# Patient Record
Sex: Female | Born: 1955 | Race: White | Hispanic: No | Marital: Married
Health system: Midwestern US, Community
[De-identification: ages and names within clinical notes are randomized; demographics above are authoritative.]

## PROBLEM LIST (undated history)

## (undated) DIAGNOSIS — F5101 Primary insomnia: Secondary | ICD-10-CM

## (undated) DIAGNOSIS — Z1231 Encounter for screening mammogram for malignant neoplasm of breast: Secondary | ICD-10-CM

## (undated) DIAGNOSIS — M797 Fibromyalgia: Secondary | ICD-10-CM

## (undated) DIAGNOSIS — J069 Acute upper respiratory infection, unspecified: Secondary | ICD-10-CM

## (undated) DIAGNOSIS — I10 Essential (primary) hypertension: Secondary | ICD-10-CM

## (undated) DIAGNOSIS — M5417 Radiculopathy, lumbosacral region: Secondary | ICD-10-CM

## (undated) DIAGNOSIS — L280 Lichen simplex chronicus: Secondary | ICD-10-CM

## (undated) DIAGNOSIS — M5126 Other intervertebral disc displacement, lumbar region: Secondary | ICD-10-CM

## (undated) DIAGNOSIS — L02224 Furuncle of groin: Secondary | ICD-10-CM

## (undated) DIAGNOSIS — R7989 Other specified abnormal findings of blood chemistry: Secondary | ICD-10-CM

## (undated) DIAGNOSIS — IMO0002 Reserved for concepts with insufficient information to code with codable children: Secondary | ICD-10-CM

## (undated) DIAGNOSIS — E782 Mixed hyperlipidemia: Secondary | ICD-10-CM

## (undated) DIAGNOSIS — IMO0001 Reserved for inherently not codable concepts without codable children: Secondary | ICD-10-CM

## (undated) DIAGNOSIS — E876 Hypokalemia: Secondary | ICD-10-CM

## (undated) DIAGNOSIS — Z9221 Personal history of antineoplastic chemotherapy: Secondary | ICD-10-CM

## (undated) DIAGNOSIS — R51 Headache: Secondary | ICD-10-CM

## (undated) DIAGNOSIS — Z882 Allergy status to sulfonamides status: Secondary | ICD-10-CM

## (undated) DIAGNOSIS — F329 Major depressive disorder, single episode, unspecified: Secondary | ICD-10-CM

## (undated) DIAGNOSIS — F419 Anxiety disorder, unspecified: Secondary | ICD-10-CM

## (undated) DIAGNOSIS — C801 Malignant (primary) neoplasm, unspecified: Secondary | ICD-10-CM

## (undated) DIAGNOSIS — Z923 Personal history of irradiation: Secondary | ICD-10-CM

## (undated) DIAGNOSIS — F32A Depression, unspecified: Secondary | ICD-10-CM

## (undated) DIAGNOSIS — R030 Elevated blood-pressure reading, without diagnosis of hypertension: Secondary | ICD-10-CM

## (undated) HISTORY — DX: Anxiety disorder, unspecified: F41.9

## (undated) HISTORY — DX: Allergy status to sulfonamides: Z88.2

## (undated) HISTORY — PX: PLACEMENT OF BREAST IMPLANTS: SHX6334

## (undated) HISTORY — DX: Headache: R51

## (undated) HISTORY — PX: OTHER SURGICAL HISTORY: SHX169

## (undated) HISTORY — DX: Reserved for inherently not codable concepts without codable children: IMO0001

## (undated) HISTORY — DX: Depression, unspecified: F32.A

## (undated) HISTORY — DX: Malignant (primary) neoplasm, unspecified: C80.1

## (undated) HISTORY — DX: Elevated blood-pressure reading, without diagnosis of hypertension: R03.0

## (undated) HISTORY — DX: Hypokalemia: E87.6

## (undated) HISTORY — PX: AUGMENTATION MAMMAPLASTY: SUR837

## (undated) HISTORY — DX: Major depressive disorder, single episode, unspecified: F32.9

---

## 1997-12-08 ENCOUNTER — Ambulatory Visit (HOSPITAL_COMMUNITY): Admission: RE | Admit: 1997-12-08 | Discharge: 1997-12-08 | Payer: Self-pay | Admitting: *Deleted

## 1998-10-15 ENCOUNTER — Other Ambulatory Visit: Admission: RE | Admit: 1998-10-15 | Discharge: 1998-10-15 | Payer: Self-pay | Admitting: *Deleted

## 1998-12-29 ENCOUNTER — Encounter: Payer: Self-pay | Admitting: *Deleted

## 1998-12-29 ENCOUNTER — Ambulatory Visit (HOSPITAL_COMMUNITY): Admission: RE | Admit: 1998-12-29 | Discharge: 1998-12-29 | Payer: Self-pay | Admitting: *Deleted

## 2000-01-28 ENCOUNTER — Ambulatory Visit (HOSPITAL_COMMUNITY): Admission: RE | Admit: 2000-01-28 | Discharge: 2000-01-28 | Payer: Self-pay | Admitting: *Deleted

## 2000-01-28 ENCOUNTER — Encounter: Payer: Self-pay | Admitting: *Deleted

## 2000-02-23 ENCOUNTER — Other Ambulatory Visit: Admission: RE | Admit: 2000-02-23 | Discharge: 2000-02-23 | Payer: Self-pay | Admitting: *Deleted

## 2000-03-10 ENCOUNTER — Other Ambulatory Visit: Admission: RE | Admit: 2000-03-10 | Discharge: 2000-03-10 | Payer: Self-pay | Admitting: *Deleted

## 2000-03-10 ENCOUNTER — Encounter (INDEPENDENT_AMBULATORY_CARE_PROVIDER_SITE_OTHER): Payer: Self-pay | Admitting: Specialist

## 2001-02-02 ENCOUNTER — Encounter: Payer: Self-pay | Admitting: *Deleted

## 2001-02-02 ENCOUNTER — Ambulatory Visit (HOSPITAL_COMMUNITY): Admission: RE | Admit: 2001-02-02 | Discharge: 2001-02-02 | Payer: Self-pay | Admitting: *Deleted

## 2001-06-19 ENCOUNTER — Other Ambulatory Visit: Admission: RE | Admit: 2001-06-19 | Discharge: 2001-06-19 | Payer: Self-pay | Admitting: *Deleted

## 2002-03-05 ENCOUNTER — Encounter: Payer: Self-pay | Admitting: *Deleted

## 2002-03-05 ENCOUNTER — Ambulatory Visit (HOSPITAL_COMMUNITY): Admission: RE | Admit: 2002-03-05 | Discharge: 2002-03-05 | Payer: Self-pay | Admitting: *Deleted

## 2002-06-11 ENCOUNTER — Other Ambulatory Visit: Admission: RE | Admit: 2002-06-11 | Discharge: 2002-06-11 | Payer: Self-pay | Admitting: *Deleted

## 2002-08-08 HISTORY — PX: BREAST LUMPECTOMY: SHX2

## 2002-11-19 ENCOUNTER — Encounter (INDEPENDENT_AMBULATORY_CARE_PROVIDER_SITE_OTHER): Payer: Self-pay | Admitting: *Deleted

## 2002-11-19 ENCOUNTER — Other Ambulatory Visit: Admission: RE | Admit: 2002-11-19 | Discharge: 2002-11-19 | Payer: Self-pay | Admitting: Radiology

## 2002-11-19 ENCOUNTER — Encounter: Admission: RE | Admit: 2002-11-19 | Discharge: 2002-11-19 | Payer: Self-pay | Admitting: *Deleted

## 2002-11-19 ENCOUNTER — Encounter (INDEPENDENT_AMBULATORY_CARE_PROVIDER_SITE_OTHER): Payer: Self-pay | Admitting: Radiology

## 2002-11-19 ENCOUNTER — Encounter: Payer: Self-pay | Admitting: *Deleted

## 2002-11-25 ENCOUNTER — Encounter: Payer: Self-pay | Admitting: General Surgery

## 2002-11-25 ENCOUNTER — Ambulatory Visit (HOSPITAL_COMMUNITY): Admission: RE | Admit: 2002-11-25 | Discharge: 2002-11-25 | Payer: Self-pay | Admitting: General Surgery

## 2002-11-25 ENCOUNTER — Encounter (INDEPENDENT_AMBULATORY_CARE_PROVIDER_SITE_OTHER): Payer: Self-pay | Admitting: *Deleted

## 2002-12-02 ENCOUNTER — Ambulatory Visit: Admission: RE | Admit: 2002-12-02 | Discharge: 2002-12-17 | Payer: Self-pay | Admitting: *Deleted

## 2002-12-10 ENCOUNTER — Ambulatory Visit: Admission: RE | Admit: 2002-12-10 | Discharge: 2002-12-10 | Payer: Self-pay | Admitting: Oncology

## 2002-12-10 ENCOUNTER — Encounter (INDEPENDENT_AMBULATORY_CARE_PROVIDER_SITE_OTHER): Payer: Self-pay | Admitting: Cardiology

## 2003-01-08 ENCOUNTER — Ambulatory Visit (HOSPITAL_BASED_OUTPATIENT_CLINIC_OR_DEPARTMENT_OTHER): Admission: RE | Admit: 2003-01-08 | Discharge: 2003-01-08 | Payer: Self-pay | Admitting: General Surgery

## 2003-01-08 ENCOUNTER — Encounter: Payer: Self-pay | Admitting: General Surgery

## 2003-04-09 ENCOUNTER — Ambulatory Visit: Admission: RE | Admit: 2003-04-09 | Discharge: 2003-06-13 | Payer: Self-pay | Admitting: *Deleted

## 2003-05-09 ENCOUNTER — Ambulatory Visit (HOSPITAL_COMMUNITY): Admission: RE | Admit: 2003-05-09 | Discharge: 2003-05-09 | Payer: Self-pay | Admitting: General Surgery

## 2003-06-12 ENCOUNTER — Other Ambulatory Visit: Admission: RE | Admit: 2003-06-12 | Discharge: 2003-06-12 | Payer: Self-pay | Admitting: *Deleted

## 2003-07-11 ENCOUNTER — Ambulatory Visit: Admission: RE | Admit: 2003-07-11 | Discharge: 2003-07-11 | Payer: Self-pay | Admitting: *Deleted

## 2003-10-27 ENCOUNTER — Ambulatory Visit (HOSPITAL_COMMUNITY): Admission: RE | Admit: 2003-10-27 | Discharge: 2003-10-27 | Payer: Self-pay | Admitting: Oncology

## 2003-11-21 ENCOUNTER — Encounter: Admission: RE | Admit: 2003-11-21 | Discharge: 2003-11-21 | Payer: Self-pay | Admitting: *Deleted

## 2004-06-15 ENCOUNTER — Other Ambulatory Visit: Admission: RE | Admit: 2004-06-15 | Discharge: 2004-06-15 | Payer: Self-pay | Admitting: *Deleted

## 2004-09-27 ENCOUNTER — Ambulatory Visit: Payer: Self-pay | Admitting: Oncology

## 2004-09-27 ENCOUNTER — Ambulatory Visit (HOSPITAL_COMMUNITY): Admission: RE | Admit: 2004-09-27 | Discharge: 2004-09-27 | Payer: Self-pay | Admitting: Oncology

## 2004-11-22 ENCOUNTER — Encounter: Admission: RE | Admit: 2004-11-22 | Discharge: 2004-11-22 | Payer: Self-pay | Admitting: Oncology

## 2004-12-24 ENCOUNTER — Ambulatory Visit: Payer: Self-pay | Admitting: Oncology

## 2005-04-22 ENCOUNTER — Ambulatory Visit: Payer: Self-pay | Admitting: Oncology

## 2005-06-27 ENCOUNTER — Other Ambulatory Visit: Admission: RE | Admit: 2005-06-27 | Discharge: 2005-06-27 | Payer: Self-pay | Admitting: *Deleted

## 2005-08-10 ENCOUNTER — Ambulatory Visit: Payer: Self-pay | Admitting: Internal Medicine

## 2005-08-16 ENCOUNTER — Ambulatory Visit: Payer: Self-pay | Admitting: Internal Medicine

## 2005-08-19 ENCOUNTER — Ambulatory Visit: Payer: Self-pay | Admitting: Oncology

## 2005-11-23 ENCOUNTER — Encounter: Admission: RE | Admit: 2005-11-23 | Discharge: 2005-11-23 | Payer: Self-pay | Admitting: Oncology

## 2005-12-15 ENCOUNTER — Ambulatory Visit: Payer: Self-pay | Admitting: Oncology

## 2005-12-19 LAB — COMPREHENSIVE METABOLIC PANEL
ALT: 18 U/L (ref 0–40)
Albumin: 4.4 g/dL (ref 3.5–5.2)
CO2: 28 mEq/L (ref 19–32)
Calcium: 8.6 mg/dL (ref 8.4–10.5)
Chloride: 104 mEq/L (ref 96–112)
Glucose, Bld: 88 mg/dL (ref 70–99)
Potassium: 3.3 mEq/L — ABNORMAL LOW (ref 3.5–5.3)
Sodium: 142 mEq/L (ref 135–145)
Total Bilirubin: 0.5 mg/dL (ref 0.3–1.2)
Total Protein: 7.1 g/dL (ref 6.0–8.3)

## 2005-12-19 LAB — CBC WITH DIFFERENTIAL/PLATELET
Eosinophils Absolute: 0 10*3/uL (ref 0.0–0.5)
LYMPH%: 32.7 % (ref 14.0–48.0)
MCV: 91.3 fL (ref 81.0–101.0)
MONO%: 9.3 % (ref 0.0–13.0)
NEUT#: 2.5 10*3/uL (ref 1.5–6.5)
Platelets: 200 10*3/uL (ref 145–400)
RBC: 4.62 10*6/uL (ref 3.70–5.32)

## 2005-12-19 LAB — LACTATE DEHYDROGENASE: LDH: 172 U/L (ref 94–250)

## 2006-01-27 ENCOUNTER — Ambulatory Visit: Payer: Self-pay | Admitting: Oncology

## 2006-01-31 LAB — BASIC METABOLIC PANEL
BUN: 7 mg/dL (ref 6–23)
CO2: 29 mEq/L (ref 19–32)
Chloride: 104 mEq/L (ref 96–112)
Glucose, Bld: 115 mg/dL — ABNORMAL HIGH (ref 70–99)
Potassium: 3.6 mEq/L (ref 3.5–5.3)

## 2006-01-31 LAB — CBC WITH DIFFERENTIAL/PLATELET
Basophils Absolute: 0 10*3/uL (ref 0.0–0.1)
EOS%: 1 % (ref 0.0–7.0)
Eosinophils Absolute: 0.1 10*3/uL (ref 0.0–0.5)
HGB: 14.3 g/dL (ref 11.6–15.9)
MONO#: 0.4 10*3/uL (ref 0.1–0.9)
NEUT#: 3 10*3/uL (ref 1.5–6.5)
RDW: 11.6 % (ref 11.3–14.5)
WBC: 5.2 10*3/uL (ref 3.9–10.0)
lymph#: 1.7 10*3/uL (ref 0.9–3.3)

## 2006-04-14 ENCOUNTER — Ambulatory Visit: Payer: Self-pay | Admitting: Oncology

## 2006-04-17 LAB — CBC WITH DIFFERENTIAL/PLATELET
Basophils Absolute: 0 10*3/uL (ref 0.0–0.1)
EOS%: 1 % (ref 0.0–7.0)
Eosinophils Absolute: 0 10*3/uL (ref 0.0–0.5)
HCT: 42.6 % (ref 34.8–46.6)
HGB: 14.9 g/dL (ref 11.6–15.9)
MCH: 31.9 pg (ref 26.0–34.0)
MCV: 91.1 fL (ref 81.0–101.0)
MONO%: 7.7 % (ref 0.0–13.0)
NEUT#: 3 10*3/uL (ref 1.5–6.5)
NEUT%: 63.5 % (ref 39.6–76.8)
RDW: 11.7 % (ref 11.3–14.5)

## 2006-04-17 LAB — COMPREHENSIVE METABOLIC PANEL
BUN: 9 mg/dL (ref 6–23)
CO2: 28 mEq/L (ref 19–32)
Calcium: 9.3 mg/dL (ref 8.4–10.5)
Chloride: 106 mEq/L (ref 96–112)
Creatinine, Ser: 0.67 mg/dL (ref 0.40–1.20)
Total Bilirubin: 0.4 mg/dL (ref 0.3–1.2)

## 2006-04-17 LAB — LACTATE DEHYDROGENASE: LDH: 151 U/L (ref 94–250)

## 2006-07-20 ENCOUNTER — Other Ambulatory Visit: Admission: RE | Admit: 2006-07-20 | Discharge: 2006-07-20 | Payer: Self-pay | Admitting: *Deleted

## 2006-08-09 ENCOUNTER — Ambulatory Visit: Payer: Self-pay | Admitting: Oncology

## 2006-08-14 ENCOUNTER — Ambulatory Visit (HOSPITAL_COMMUNITY): Admission: RE | Admit: 2006-08-14 | Discharge: 2006-08-14 | Payer: Self-pay | Admitting: Oncology

## 2006-08-14 LAB — COMPREHENSIVE METABOLIC PANEL
ALT: 18 U/L (ref 0–35)
AST: 24 U/L (ref 0–37)
Calcium: 9.2 mg/dL (ref 8.4–10.5)
Chloride: 104 mEq/L (ref 96–112)
Creatinine, Ser: 0.67 mg/dL (ref 0.40–1.20)
Sodium: 141 mEq/L (ref 135–145)
Total Bilirubin: 0.6 mg/dL (ref 0.3–1.2)
Total Protein: 7.1 g/dL (ref 6.0–8.3)

## 2006-08-14 LAB — CBC WITH DIFFERENTIAL/PLATELET
BASO%: 0.2 % (ref 0.0–2.0)
EOS%: 1 % (ref 0.0–7.0)
HCT: 43.5 % (ref 34.8–46.6)
MCH: 31.6 pg (ref 26.0–34.0)
MCHC: 34.8 g/dL (ref 32.0–36.0)
NEUT%: 64.1 % (ref 39.6–76.8)
RBC: 4.8 10*6/uL (ref 3.70–5.32)
WBC: 5.7 10*3/uL (ref 3.9–10.0)
lymph#: 1.5 10*3/uL (ref 0.9–3.3)

## 2006-11-27 ENCOUNTER — Encounter: Admission: RE | Admit: 2006-11-27 | Discharge: 2006-11-27 | Payer: Self-pay | Admitting: Oncology

## 2006-12-07 ENCOUNTER — Ambulatory Visit: Payer: Self-pay | Admitting: Oncology

## 2006-12-11 LAB — CBC WITH DIFFERENTIAL/PLATELET
Basophils Absolute: 0 10*3/uL (ref 0.0–0.1)
EOS%: 0.8 % (ref 0.0–7.0)
HCT: 40.3 % (ref 34.8–46.6)
HGB: 14.4 g/dL (ref 11.6–15.9)
LYMPH%: 26.4 % (ref 14.0–48.0)
MCH: 31.9 pg (ref 26.0–34.0)
MCV: 89.4 fL (ref 81.0–101.0)
MONO%: 6.9 % (ref 0.0–13.0)
NEUT%: 65.5 % (ref 39.6–76.8)

## 2006-12-11 LAB — COMPREHENSIVE METABOLIC PANEL
ALT: 35 U/L (ref 0–35)
AST: 45 U/L — ABNORMAL HIGH (ref 0–37)
Calcium: 8.5 mg/dL (ref 8.4–10.5)
Chloride: 108 mEq/L (ref 96–112)
Creatinine, Ser: 0.62 mg/dL (ref 0.40–1.20)
Sodium: 145 mEq/L (ref 135–145)
Total Protein: 6.7 g/dL (ref 6.0–8.3)

## 2007-01-08 ENCOUNTER — Ambulatory Visit: Payer: Self-pay | Admitting: Gastroenterology

## 2007-01-22 ENCOUNTER — Encounter: Payer: Self-pay | Admitting: Internal Medicine

## 2007-01-22 ENCOUNTER — Ambulatory Visit: Payer: Self-pay | Admitting: Gastroenterology

## 2007-03-01 ENCOUNTER — Ambulatory Visit: Payer: Self-pay | Admitting: Oncology

## 2007-03-14 LAB — ESTRADIOL, ULTRA SENS: Estradiol, Ultra Sensitive: 7 pg/mL

## 2007-03-16 ENCOUNTER — Encounter: Payer: Self-pay | Admitting: Internal Medicine

## 2007-03-26 LAB — COMPREHENSIVE METABOLIC PANEL
Albumin: 4.2 g/dL (ref 3.5–5.2)
Alkaline Phosphatase: 59 U/L (ref 39–117)
BUN: 8 mg/dL (ref 6–23)
CO2: 27 mEq/L (ref 19–32)
Calcium: 9 mg/dL (ref 8.4–10.5)
Chloride: 104 mEq/L (ref 96–112)
Glucose, Bld: 95 mg/dL (ref 70–99)
Potassium: 3.9 mEq/L (ref 3.5–5.3)

## 2007-03-26 LAB — CBC WITH DIFFERENTIAL/PLATELET
Basophils Absolute: 0 10*3/uL (ref 0.0–0.1)
HCT: 41.7 % (ref 34.8–46.6)
HGB: 15.2 g/dL (ref 11.6–15.9)
MONO#: 0.3 10*3/uL (ref 0.1–0.9)
NEUT%: 74.6 % (ref 39.6–76.8)
WBC: 6 10*3/uL (ref 3.9–10.0)
lymph#: 1.2 10*3/uL (ref 0.9–3.3)

## 2007-03-26 LAB — LACTATE DEHYDROGENASE: LDH: 178 U/L (ref 94–250)

## 2007-07-12 ENCOUNTER — Ambulatory Visit: Payer: Self-pay | Admitting: Oncology

## 2007-07-16 ENCOUNTER — Ambulatory Visit (HOSPITAL_COMMUNITY): Admission: RE | Admit: 2007-07-16 | Discharge: 2007-07-16 | Payer: Self-pay | Admitting: Oncology

## 2007-07-16 LAB — COMPREHENSIVE METABOLIC PANEL
ALT: 21 U/L (ref 0–35)
Albumin: 4.2 g/dL (ref 3.5–5.2)
CO2: 27 mEq/L (ref 19–32)
Glucose, Bld: 91 mg/dL (ref 70–99)
Potassium: 3.9 mEq/L (ref 3.5–5.3)
Sodium: 144 mEq/L (ref 135–145)
Total Protein: 6.9 g/dL (ref 6.0–8.3)

## 2007-07-16 LAB — CBC WITH DIFFERENTIAL/PLATELET
BASO%: 0.7 % (ref 0.0–2.0)
Eosinophils Absolute: 0 10*3/uL (ref 0.0–0.5)
MONO#: 0.4 10*3/uL (ref 0.1–0.9)
NEUT#: 3.4 10*3/uL (ref 1.5–6.5)
RBC: 4.62 10*6/uL (ref 3.70–5.32)
RDW: 11.6 % (ref 11.3–14.5)
WBC: 5.3 10*3/uL (ref 3.9–10.0)
lymph#: 1.4 10*3/uL (ref 0.9–3.3)

## 2007-07-16 LAB — LACTATE DEHYDROGENASE: LDH: 186 U/L (ref 94–250)

## 2007-07-23 ENCOUNTER — Other Ambulatory Visit: Admission: RE | Admit: 2007-07-23 | Discharge: 2007-07-23 | Payer: Self-pay | Admitting: *Deleted

## 2007-09-07 ENCOUNTER — Emergency Department (HOSPITAL_COMMUNITY): Admission: EM | Admit: 2007-09-07 | Discharge: 2007-09-07 | Payer: Self-pay | Admitting: Emergency Medicine

## 2007-11-13 ENCOUNTER — Ambulatory Visit: Payer: Self-pay | Admitting: Oncology

## 2007-11-15 LAB — CBC WITH DIFFERENTIAL/PLATELET
BASO%: 0.3 % (ref 0.0–2.0)
Basophils Absolute: 0 10*3/uL (ref 0.0–0.1)
EOS%: 0.6 % (ref 0.0–7.0)
HCT: 41.3 % (ref 34.8–46.6)
LYMPH%: 27.5 % (ref 14.0–48.0)
MCH: 31.5 pg (ref 26.0–34.0)
MCHC: 35.5 g/dL (ref 32.0–36.0)
MCV: 88.7 fL (ref 81.0–101.0)
MONO%: 6.4 % (ref 0.0–13.0)
NEUT%: 65.2 % (ref 39.6–76.8)
Platelets: 224 10*3/uL (ref 145–400)
lymph#: 1.6 10*3/uL (ref 0.9–3.3)

## 2007-11-15 LAB — COMPREHENSIVE METABOLIC PANEL
ALT: 23 U/L (ref 0–35)
AST: 29 U/L (ref 0–37)
Alkaline Phosphatase: 55 U/L (ref 39–117)
BUN: 9 mg/dL (ref 6–23)
Creatinine, Ser: 0.68 mg/dL (ref 0.40–1.20)
Total Bilirubin: 0.5 mg/dL (ref 0.3–1.2)

## 2007-11-28 ENCOUNTER — Encounter: Admission: RE | Admit: 2007-11-28 | Discharge: 2007-11-28 | Payer: Self-pay | Admitting: *Deleted

## 2007-12-10 ENCOUNTER — Encounter: Admission: RE | Admit: 2007-12-10 | Discharge: 2007-12-10 | Payer: Self-pay | Admitting: *Deleted

## 2008-03-13 ENCOUNTER — Ambulatory Visit: Payer: Self-pay | Admitting: Oncology

## 2008-03-17 ENCOUNTER — Encounter: Payer: Self-pay | Admitting: Internal Medicine

## 2008-03-17 LAB — CBC WITH DIFFERENTIAL/PLATELET
Basophils Absolute: 0 10*3/uL (ref 0.0–0.1)
EOS%: 0.7 % (ref 0.0–7.0)
Eosinophils Absolute: 0.1 10*3/uL (ref 0.0–0.5)
HGB: 15.8 g/dL (ref 11.6–15.9)
MONO#: 0.5 10*3/uL (ref 0.1–0.9)
NEUT#: 5 10*3/uL (ref 1.5–6.5)
RDW: 11.8 % (ref 11.3–14.5)
lymph#: 1.9 10*3/uL (ref 0.9–3.3)

## 2008-03-17 LAB — COMPREHENSIVE METABOLIC PANEL
AST: 28 U/L (ref 0–37)
Albumin: 4.6 g/dL (ref 3.5–5.2)
BUN: 9 mg/dL (ref 6–23)
CO2: 26 mEq/L (ref 19–32)
Calcium: 9.4 mg/dL (ref 8.4–10.5)
Chloride: 101 mEq/L (ref 96–112)
Glucose, Bld: 89 mg/dL (ref 70–99)
Potassium: 3.3 mEq/L — ABNORMAL LOW (ref 3.5–5.3)

## 2008-04-03 LAB — BASIC METABOLIC PANEL
CO2: 28 mEq/L (ref 19–32)
Calcium: 9.1 mg/dL (ref 8.4–10.5)
Chloride: 104 mEq/L (ref 96–112)
Glucose, Bld: 90 mg/dL (ref 70–99)
Sodium: 142 mEq/L (ref 135–145)

## 2008-06-13 ENCOUNTER — Ambulatory Visit: Payer: Self-pay | Admitting: Oncology

## 2008-06-17 ENCOUNTER — Ambulatory Visit (HOSPITAL_COMMUNITY): Admission: RE | Admit: 2008-06-17 | Discharge: 2008-06-17 | Payer: Self-pay | Admitting: Oncology

## 2008-06-17 ENCOUNTER — Encounter: Payer: Self-pay | Admitting: Internal Medicine

## 2008-06-17 LAB — COMPREHENSIVE METABOLIC PANEL
ALT: 18 U/L (ref 0–35)
AST: 25 U/L (ref 0–37)
Albumin: 4.4 g/dL (ref 3.5–5.2)
Alkaline Phosphatase: 53 U/L (ref 39–117)
BUN: 8 mg/dL (ref 6–23)
Potassium: 3.3 mEq/L — ABNORMAL LOW (ref 3.5–5.3)
Sodium: 142 mEq/L (ref 135–145)
Total Protein: 7.2 g/dL (ref 6.0–8.3)

## 2008-06-17 LAB — CBC WITH DIFFERENTIAL/PLATELET
BASO%: 0.2 % (ref 0.0–2.0)
EOS%: 0.8 % (ref 0.0–7.0)
MCH: 32 pg (ref 26.0–34.0)
MCHC: 35.6 g/dL (ref 32.0–36.0)
MCV: 89.8 fL (ref 81.0–101.0)
MONO%: 5.9 % (ref 0.0–13.0)
RBC: 4.91 10*6/uL (ref 3.70–5.32)
RDW: 11.7 % (ref 11.3–14.5)
lymph#: 1.6 10*3/uL (ref 0.9–3.3)

## 2008-06-26 LAB — ESTRADIOL, ULTRA SENS

## 2008-07-25 ENCOUNTER — Ambulatory Visit: Payer: Self-pay | Admitting: Internal Medicine

## 2008-07-26 LAB — CONVERTED CEMR LAB
Hgb A1c MFr Bld: 4.8 % (ref 4.6–6.0)
Potassium: 3.5 meq/L (ref 3.5–5.1)

## 2008-07-30 ENCOUNTER — Telehealth: Payer: Self-pay | Admitting: Internal Medicine

## 2008-07-30 ENCOUNTER — Encounter (INDEPENDENT_AMBULATORY_CARE_PROVIDER_SITE_OTHER): Payer: Self-pay | Admitting: *Deleted

## 2008-08-07 ENCOUNTER — Ambulatory Visit: Payer: Self-pay | Admitting: Internal Medicine

## 2008-08-07 DIAGNOSIS — Z853 Personal history of malignant neoplasm of breast: Secondary | ICD-10-CM | POA: Insufficient documentation

## 2008-08-07 DIAGNOSIS — R030 Elevated blood-pressure reading, without diagnosis of hypertension: Secondary | ICD-10-CM | POA: Insufficient documentation

## 2008-08-07 DIAGNOSIS — E876 Hypokalemia: Secondary | ICD-10-CM | POA: Insufficient documentation

## 2008-08-18 ENCOUNTER — Other Ambulatory Visit: Admission: RE | Admit: 2008-08-18 | Discharge: 2008-08-18 | Payer: Self-pay | Admitting: Gynecology

## 2008-08-26 ENCOUNTER — Ambulatory Visit: Payer: Self-pay | Admitting: Oncology

## 2008-08-28 LAB — BASIC METABOLIC PANEL
BUN: 8 mg/dL (ref 6–23)
Calcium: 9.6 mg/dL (ref 8.4–10.5)
Glucose, Bld: 87 mg/dL (ref 70–99)

## 2008-09-06 LAB — ESTRADIOL, ULTRA SENS

## 2008-09-19 NOTE — Telephone Encounter (Signed)
I called this into [facility] at [facility] to [place].

## 2008-10-23 NOTE — Progress Notes (Signed)
Subjective:      Patient ID: Cassandra Copeland is a 52 y.o. female.    HPI see cc's    Review of Systems   Constitutional: Negative for fever and unexpected weight change.   HENT: Positive for ear pain and congestion. Negative for sore throat and rhinorrhea.    Respiratory: Negative.    Cardiovascular: Negative.    Musculoskeletal: Positive for arthralgias (L ankle pain).       Objective:   Physical Exam   Vitals reviewed.  Constitutional: She appears well-developed and well-nourished.   HENT:   Head: Atraumatic.   Eyes: Conjunctivae are normal. No scleral icterus.   Neck: Neck supple. No thyromegaly present.   Cardiovascular: Normal rate, regular rhythm, normal heart sounds and intact distal pulses.    Pulmonary/Chest: Effort normal and breath sounds normal. No respiratory distress. She has no rales.   Abdominal: Soft. Bowel sounds are normal. She exhibits no mass.   Musculoskeletal: Normal range of motion.        Left ankle: Normal.   Lymphadenopathy:     She has no cervical adenopathy.     She has no axillary adenopathy.        Right: No supraclavicular adenopathy present.        Left: No supraclavicular adenopathy present.   Skin: Skin is warm.   Psychiatric: She has a normal mood and affect.   E  Assessment:      See visit diagnoses/diagnosis  Elev BP      Plan:      see orders  Low na diet, ov 12m

## 2008-10-31 NOTE — Telephone Encounter (Signed)
I notified patient that Ins will not cover Prevacid and she would need to contact her Ins to see what they cover.  She will call us back and let us know what is covered so we can call in that med

## 2008-11-28 ENCOUNTER — Encounter: Admission: RE | Admit: 2008-11-28 | Discharge: 2008-11-28 | Payer: Self-pay | Admitting: Gynecology

## 2008-12-11 ENCOUNTER — Ambulatory Visit: Payer: Self-pay | Admitting: Oncology

## 2008-12-15 ENCOUNTER — Encounter: Payer: Self-pay | Admitting: Internal Medicine

## 2008-12-15 LAB — COMPREHENSIVE METABOLIC PANEL
ALT: 24 U/L (ref 0–35)
AST: 26 U/L (ref 0–37)
Creatinine, Ser: 0.65 mg/dL (ref 0.40–1.20)
Total Bilirubin: 0.4 mg/dL (ref 0.3–1.2)

## 2008-12-15 LAB — CBC WITH DIFFERENTIAL/PLATELET
BASO%: 0.2 % (ref 0.0–2.0)
EOS%: 0.8 % (ref 0.0–7.0)
HCT: 41.8 % (ref 34.8–46.6)
LYMPH%: 29.6 % (ref 14.0–49.7)
MCH: 31.3 pg (ref 25.1–34.0)
MCHC: 36.1 g/dL — ABNORMAL HIGH (ref 31.5–36.0)
MONO%: 6 % (ref 0.0–14.0)
NEUT%: 63.4 % (ref 38.4–76.8)
Platelets: 180 10*3/uL (ref 145–400)

## 2008-12-15 LAB — LACTATE DEHYDROGENASE: LDH: 177 U/L (ref 94–250)

## 2009-02-25 NOTE — Progress Notes (Signed)
Subjective:      Patient ID: Cassandra Copeland is a 53 y.o. female.    HPI  Chief Complaint   Patient presents with   ??? Pain     all over x 2 days; feels like someone hit her with a baseball bat; ran out of celexa 1week ago   ??? Otalgia     bilateral   Patient started feeling bad about 1 week ago.  Non specific. Got better, then yesterday felt dizzy, ears popping and aching all over        Review of Systems   Constitutional: Positive for fever. Negative for diaphoresis.   HENT: Positive for congestion and sinus pressure. Negative for hearing loss, ear pain, sore throat, neck pain, voice change and ear discharge.    Eyes: Negative for visual disturbance.   Respiratory: Negative for cough and shortness of breath.    Cardiovascular: Negative for chest pain.   Gastrointestinal: Positive for diarrhea (for 3 days). Negative for vomiting, blood in stool and rectal pain.   Genitourinary: Negative.    Musculoskeletal: Positive for myalgias and arthralgias.       Objective:   Physical Exam   Vitals reviewed.  Constitutional: She is oriented to person, place, and time. She appears well-developed. No distress.   HENT:   Head: Normocephalic.   Right Ear: External ear normal. No tenderness. Tympanic membrane is not injected. A middle ear effusion is present.   Left Ear: External ear normal. No tenderness. Tympanic membrane is not injected. A middle ear effusion is present.   Mouth/Throat: Oropharynx is clear and moist.   Cardiovascular: Normal rate, regular rhythm and normal heart sounds.    Pulmonary/Chest: Effort normal and breath sounds normal. She has no wheezes. She has no rales.   Abdominal: Soft. She exhibits no distension and no mass. She has no guarding.   Musculoskeletal: She exhibits no edema.   Neurological: She is alert and oriented to person, place, and time.   Skin: No rash noted. She is not diaphoretic. No erythema. No pallor.       Assessment:      1. Viral Syndrome (079.99B)  guaifenesin (MUCINEX) 600 MG SR tablet,  CBC WITH AUTO DIFFERENTIAL   2. Fibromyalgia (729.1AV)  citalopram (CELEXA) 40 MG tablet             Plan:

## 2009-02-26 LAB — CBC WITH AUTO DIFFERENTIAL
Basophils %: 0.4 % (ref 0.0–2.0)
Basophils Absolute: 0 10*3 (ref 0.0–0.2)
Eosinophils %: 0.4 % (ref 0.0–5.0)
Eosinophils Absolute: 0 10*3 (ref 0.0–0.6)
Granulocyte Absolute Count: 9.9 10*3 — ABNORMAL HIGH (ref 1.7–7.7)
Hematocrit: 45.6 % (ref 36.0–48.0)
Hemoglobin: 15.5 g/dL (ref 12.0–16.0)
Lymphocytes %: 14.4 % — ABNORMAL LOW (ref 25.0–40.0)
Lymphocytes Absolute: 1.8 10*3 (ref 1.0–5.1)
MCH: 30.1 pg (ref 26–34)
MCHC: 34 g/dL (ref 31–36)
MCV: 88.5 fl (ref 80–100)
MPV: 7.7 fl (ref 5.0–10.5)
Monocytes %: 3.7 % (ref 0.0–8.0)
Monocytes Absolute: 0.5 10*3 (ref 0.0–0.95)
Platelets: 217 10*3 (ref 135–450)
RBC: 5.15 10*6 (ref 4.0–5.2)
RDW: 13.4 % (ref 11.5–14.5)
Segs Relative: 81.1 % — ABNORMAL HIGH (ref 42.0–63.0)
WBC: 12.2 10*3 — ABNORMAL HIGH (ref 4.0–11.0)

## 2009-02-26 NOTE — Progress Notes (Addendum)
Patient notified

## 2009-02-27 NOTE — Telephone Encounter (Signed)
Patient notified

## 2009-02-27 NOTE — Telephone Encounter (Signed)
Patient is feeling worse now... She has a temp of 102.7 this am... Last night it was about 101. She feels like she's been beaten up. She just wants to know if you can call in an antibiotic.

## 2009-02-27 NOTE — Telephone Encounter (Signed)
levaquin 500mg m daily for 7 days; ov on 7/26 if no better.

## 2009-03-02 LAB — BASIC METABOLIC PANEL
Anion Gap: 9.6
BUN: 11 mg/dL (ref 7–18)
CO2: 25 meq/L (ref 21–32)
Calcium: 8.8 mg/dL (ref 8.3–10.6)
Chloride: 102 meq/L (ref 99–110)
Creatinine: 0.6 mg/dL (ref 0.6–1.1)
GFR Est, African/Amer: >60
GFR, Estimated: >60 (ref >60)
Glucose: 125 mg/dL — ABNORMAL HIGH (ref 70–99)
Potassium: 3.3 meq/L — ABNORMAL LOW (ref 3.5–5.1)
Sodium: 137 meq/L (ref 136–145)

## 2009-03-02 LAB — URINALYSIS
Bilirubin, Urine: NEGATIVE
Glucose, UA: NEGATIVE
Ketones, Urine: 15 — ABNORMAL HIGH
Leukocyte Esterase, Urine: NEGATIVE
Nitrite, Urine: NEGATIVE
Protein, UA: 30
Specific Gravity, UA: 1.02 (ref 1.003–1.030)
Urobilinogen, Urine: 0.2 EU/dl (ref <2.0)
pH, UA: 6 (ref 4.5–8.0)

## 2009-03-02 LAB — CBC WITH AUTO DIFFERENTIAL
Basophils %: 0.4 % (ref 0.0–2.0)
Basophils Absolute: 0 10*3 (ref 0.0–0.2)
Eosinophils %: 0.6 % (ref 0.0–5.0)
Eosinophils Absolute: 0.1 10*3 (ref 0.0–0.6)
Granulocyte Absolute Count: 6.4 10*3 (ref 1.7–7.7)
Hematocrit: 37.7 % (ref 36.0–48.0)
Hemoglobin: 12.7 g/dL (ref 12.0–16.0)
Lymphocytes %: 20.4 % — ABNORMAL LOW (ref 25.0–40.0)
Lymphocytes Absolute: 1.8 10*3 (ref 1.0–5.1)
MCH: 29.9 pg (ref 26–34)
MCHC: 33.8 g/dL (ref 31–36)
MCV: 88.6 fl (ref 80–100)
MPV: 7.1 fl (ref 5.0–10.5)
Monocytes %: 5.1 % (ref 0.0–8.0)
Monocytes Absolute: 0.4 10*3 (ref 0.0–0.95)
Platelets: 276 10*3 (ref 135–450)
RBC: 4.26 10*6 (ref 4.0–5.2)
RDW: 12.8 % (ref 11.5–14.5)
Segs Relative: 73.5 % — ABNORMAL HIGH (ref 42.0–63.0)
WBC: 8.6 10*3 (ref 4.0–11.0)

## 2009-03-02 NOTE — Progress Notes (Signed)
Patient admitted.  My family physician note in the hospital record will serve as the encounter note.

## 2009-03-02 NOTE — H&P (Unsigned)
PATIENT NAME                  PA #             MR #                  Cassandra Copeland, Cassandra Copeland                 [id number]       [id number]            ATTENDING PHYSICIAN                   ADM DATE    DIS DATE           Everardo Beals, MD               03/02/2009                     DATE OF BIRTH    AGE            PATIENT TYPE      RM #               [birth year]       53             [facility]               0217                  CHIEF COMPLAINT:  Fevers, loose stools, sore areas on left buttock and right  flank.      HISTORY OF PRESENT ILLNESS:  The patient is a 53 year old white female who  was recently seen in my office on 02/25/2009 with nonspecific symptoms of  feeling badly, feeling beat up, slight temperature, lightheadedness and loose  stools for 3 days.  At that time she had no other symptoms.  A CBC was done.   She had a mild leukocytosis but really not much, and we just thought that we  would observe.  However, 2 days later she complained again of feeling this  way with fevers.  She was placed empirically on Levaquin, thinking that maybe  it was a bronchitis or something going on since she had a little bit of a  cough.  She presents today stating that over the weekend she developed a lot  of pain in her left buttock, but she had been having this pain even when she  came to my office but had not thought to mention it.  She had some bumps as  well.  The patient is here for evaluation.       REVIEW OF SYSTEMS:  GENERAL:  Positive fever, negative sweats.  PSYCHIATRIC:   Denies.  NEUROLOGIC:  Denies.  EYES, EARS, NOSE AND THROAT:  No complaints.   HEME AND ENDO:  No complaints.  CARDIOVASCULAR AND RESPIRATORY:  No  complaints:  GI AND GU:  No complaints except for loose stools which have now  gotten better.  SKIN:  Complaining of painful and draining areas on the left  buttock and vulvar region as well as on the right flank and some small spots  in her pubic region on the skin.      PAST MEDICAL HISTORY:    1.   Hospitalizations:  Childbirths, tonsillectomy, bladder surgeries, carpal  tunnel surgeries.   2.  Operations:  See #1.   3.  Other Medical Illnesses:  Really  nothing.   4.  Medications at the Time of Admission:  Levaquin 500 daily   5.  ALLERGIES:  PENICILLIN AND CECLOR.  6.  Habits:  Negative smoking and negative alcohol.   7.  Social History:  Noncontributory.   8.  Family History:  Unremarkable.       PHYSICAL EXAMINATION ON THE DAY OF ADMISSION:  VITAL SIGNS:  Temperature 98.2, pulse 96 and regular, blood pressure 122/76.   GENERAL:  This is a very pleasant, alert white female in no acute distress.    SKIN:  The remarkable feature is multiple follicular lesions.  There is one  on the right flank.  Most of them, however, are in the pubic region.  The  left labium is swollen as is the left buttock region.  There are some  spontaneous open drainage sites, and there is a generalized overall feeling  of extreme fluctuance and induration.    RECTAL EXAM:  Not done.   PELVIC EXAM:  Not done.   EXTREMITIES:  Unremarkable.   HEAD, EYES, EARS, NOSE AND THROAT:  Negative.   NECK:  Negative.   LYMPH NODE:  Negative.   CHEST:  Clear to auscultation and percussion.   CARDIAC:  Regular rhythm.  No murmurs.  Strong pulses.   ABDOMEN:  Soft without hepatosplenomegaly.       ASSESSMENT:  Abscess, buttock and labium with probable staphylococcus skin  infection, diffuse.  Perhaps the inciting event was some shaving of some  areas which was about 3 days before she developed the beginning of these  lesions.      PLAN:  The patient will need evaluation and drainage of the area.  I have  spoken with Dr. Mariane Duval who will see the patient.  The patient will be placed  on vancomycin for the possibility of methicillin-resistant Staphylococcus  aureus, and we will see how she does.                                  [address] [place], MD     JS /[phone]  DD: 03/02/2009 13:49  DT: 03/02/2009 15:12  Job #: [id number]  CC: Lauris Chroman, MD

## 2009-03-02 NOTE — Consults (Unsigned)
PATIENT NAME                  PA #             MR #                  Cassandra Copeland, Cassandra Copeland                 [id number]       [id number]            ATTENDING PHYSICIAN                      CONSULTATION DATE            Everardo Beals, MD                  03/02/2009                   CONSULTING PHYSICIAN                     DISCHARGE DATE               Malloree Raboin Cleatis Polka, MD                                               DATE OF BIRTH    AGE            PATIENT TYPE      RM #               1955/12/20       53             [facility]               0217                  CONSULTING SERVICE:  General Surgery     CHIEF COMPLAINT:  Left buttock abscess.     HISTORY OF PRESENT ILLNESS:  This patient is a 53 year old female seen at the  request of Dr. Salome Spotted.  He called me earlier today requesting  consultation for this patient he was seeing in the office.  She began with  some pain in the left buttock and perineal area last week.  She was given  Levaquin.  She had progression of this.  She then had some spontaneous  drainage through a very small pinhole.  She had resultant cellulitis and Dr.  Salome Spotted made her a direct admit today.  He thought there was a soft  tissue that needed to be drained and requested consultation.  Patient states  she has not been feeling well throughout all this over the last week.     PAST MEDICAL HISTORY:  Gastroesophageal reflux disease.     PAST SURGICAL HISTORY:   She has had shoulder surgery, bladder suspension,  cholecystectomy.     ALLERGIES:  CEPHALOSPORINS and  PENICILLINS.     MEDICATIONS:  At home, include:  1.  Ambien.  2.  Celexa.     SOCIAL HISTORY:  She does not drink alcohol.  She does not smoke cigarettes.     FAMILY HISTORY:  Noncontributory.     REVIEW OF SYSTEMS:  Negative other than stated above.     PHYSICAL EXAMINATION:  VITAL SIGNS:  She is  afebrile with stable vital signs.  GENERAL:  She is comfortable, in no distress.  MUSCULOSKELETAL:  Left buttock near the perineum has an area  of significant  erythema.  There is fluctuance within the middle of this and a pinhole that  is draining some bloody fluid.  All of this is consistent with soft tissue  abscess.  This does not extend up to the labia proper.     ASSESSMENT:  Left buttock abscess.     PLAN:  Will be to do an incision and drainage and obtain cultures.   Currently, she is getting vancomycin.  I will add Tygacil given her allergy  history to broaden her coverage until we get the cultures back.  This was all  explained to the patient.  She accepted the plan.                                Nachum Derossett Cleatis Polka, MD     MP [phone]  DD: 03/02/2009 19:35  DT: 03/02/2009 21:02  Job #: [id number]  CC: Lauris Chroman, MD  CC: Jakaiya Netherland Cleatis Polka, MD

## 2009-03-03 LAB — CBC WITH AUTO DIFFERENTIAL
Basophils %: 0.3 % (ref 0.0–2.0)
Basophils Absolute: 0 10*3 (ref 0.0–0.2)
Eosinophils %: 1.8 % (ref 0.0–5.0)
Eosinophils Absolute: 0.1 10*3 (ref 0.0–0.6)
Granulocyte Absolute Count: 5.3 10*3 (ref 1.7–7.7)
Hematocrit: 37.5 % (ref 36.0–48.0)
Hemoglobin: 13.3 g/dL (ref 12.0–16.0)
Lymphocytes %: 29.7 % (ref 25.0–40.0)
Lymphocytes Absolute: 2.5 10*3 (ref 1.0–5.1)
MCH: 30.3 pg (ref 26–34)
MCHC: 35.3 g/dL (ref 31–36)
MCV: 85.7 fl (ref 80–100)
MPV: 8.4 fl (ref 5.0–10.5)
Monocytes %: 5.8 % (ref 0.0–8.0)
Monocytes Absolute: 0.5 10*3 (ref 0.0–0.95)
Platelets: 307 10*3 (ref 135–450)
RBC: 4.38 10*6 (ref 4.0–5.2)
RDW: 12.8 % (ref 11.5–14.5)
Segs Relative: 62.4 % (ref 42.0–63.0)
WBC: 8.5 10*3 (ref 4.0–11.0)

## 2009-03-03 LAB — BASIC METABOLIC PANEL
Anion Gap: 9
BUN: 14 mg/dL (ref 7–18)
CO2: 27 meq/L (ref 21–32)
Calcium: 9.1 mg/dL (ref 8.3–10.6)
Chloride: 101 meq/L (ref 99–110)
Creatinine: 0.6 mg/dL (ref 0.6–1.1)
GFR Est, African/Amer: >60
GFR, Estimated: >60 (ref >60)
Glucose: 95 mg/dL (ref 70–99)
Potassium: 3.9 meq/L (ref 3.5–5.1)
Sodium: 137 meq/L (ref 136–145)

## 2009-03-03 NOTE — Op Note (Unsigned)
PATIENT NAME                  PA #             MR #                  AILEENE, LANUM                 [id number]       [id number]            SURGEON                               SURG DATE   DIS DATE           Murvin Donning, MD               03/02/2009                     DATE OF BIRTH    AGE            PATIENT TYPE      RM #               20-Mar-1956       53                                                     PROCEDURE PERFORMED:  Incision and drainage of left buttock abscess.     PREPROCEDURE DIAGNOSIS:  Left buttock abscess.     POST PROCEDURE DIAGNOSIS:  Left buttock abscess.     SURGEON:  Consuelo Pandy, MD.     ANESTHESIA:  Local.     COMPLICATIONS:  None.     INDICATIONS FOR THE OPERATION:  The patient was evaluated and had a very red  inflamed, fluctuant area in the left buttock near the perineum.  This was  thought to be a soft tissue abscess.  It was recommended that she have  incision and drainage.  She accepted this.     DESCRIPTION OF OPERATION:  The patient remained in her hospital bed.  Alcohol  prep, lidocaine anesthetic was used.  An abscess of the skin was then  excised.  Copious amounts of pus were evacuated.  This tracked toward the  perineum and toward the labia.  The labia itself was not involved though.   Loculations were broken up digitally.  The wound was packed with gauze and a  dressing placed.     DISPOSITION:  The patient tolerated the procedure well without any acute  complications.                             Karren Newland Cleatis Polka, MD     MP [phone]  DD: 03/03/2009 00:01  DT: 03/03/2009 05:44  Job #: [id number]  CC: Dejean Tribby Cleatis Polka, MD  CC: Lauris Chroman, MD

## 2009-03-03 NOTE — H&P (Unsigned)
PATIENT NAME                  PA #             MR #                  JUANTIA, HOLFORD                 [id number]       [id number]            ATTENDING PHYSICIAN                   ADM DATE    DIS DATE           Glade Nurse, MD               03/02/2009                     DATE OF BIRTH    AGE            PATIENT TYPE      RM #               12/10/1955       53             [facility]               0217                  CHIEF COMPLAINT:  Fever and abscess on buttock.       HISTORY OF PRESENT ILLNESS:  This is a 53 year old white female with an  approximately 5 day history of tenderness and swelling in the lower left  buttock area.  The patient presented to her family doctor with the complaints  of aches and fever, however it took some days for her to describe the  aforementioned swollen tender area. The patient also notes to me today that  she developed red, tender swollen area on her right lower quadrant abdominal  area approximately 3 days ago and 2 days ago a small erythematous tender  nodule on the right lower leg.  The patient notes malaise, fever and body  aches have all but disappeared. The patient had incision and drainage of the  buttock lesion yesterday and that remains quite tender.  She is annoyed at  getting multiple IVs however I recommend we await culture and sensitivity  before placing any other type of IV and I discussed this with the patient.      PAST MEDICAL HISTORY:   1.  Anxiety.  2.  Insomnia.   3.  GERD.  4.  Osteoporosis.     PAST SURGICAL HISTORY:   1.  Tonsillectomy.  2.  Bladder tuck.  3.  Carpal tunnel surgery.   4.  Cholecystectomy.   5.  Shoulder surgery.      MEDICATIONS ON ADMISSION:  1.  Ambien.  2.  Celexa.      ALLERGIES: CEPHALOSPORIN AND PENICILLIN.     FAMILY HISTORY:  None significant.  SOCIAL HISTORY:  The patient is not a smoker.  Denies alcohol intake or other  illicit drug use.  She is independent and married with 2 adult children.      REVIEW OF SYSTEMS:  Positive mild headache.   No visual change, dizziness,  syncope, sore throat, cough, fever, chills or shortness of breath. Positive  nausea.  No vomiting.  No diarrhea,  constipation or dysuria. Positive right  anterior lower abdominal tenderness due to erythematous lesion. No joint pain  or swelling. No rash or numbness. No trauma.  PHYSICAL EXAMINATION:   VITALS:  Temperature 100.1, heart rate 68, respiratory rate 16, saturation  96% on room air, blood pressure 129/84.   HEENT: Sclerae anicteric. Conjunctivae pink.  Pupils equal, round and  reactive to light. Extraocular muscles intact. Mucous membranes moist without  lesion.   NECK:  No lymphadenopathy, JVD or bruit. No thyromegaly.  HEART: Regular rate and rhythm, S1 and S2.  No murmur, rub or gallop.   PULMONARY: Clear to auscultation bilaterally without rales, wheeze, rhonchi  or dullness. Good air movement.   ABDOMEN:  Nontender, nondistended.  Normal active bowel sounds.  Positive  right lower quadrant erythematous nodule with firm subcutaneous component and  warmth. No other masses.  No organomegaly or ascites.    BUTTOCKS:  Lower buttock and perineal area with bloody drainage.  Dressing  intact.  Erythema extending to the labia on the right side with erythema and  firmness still present. The area is tender.   EXTREMITIES: No clubbing, cyanosis or edema. Peripheral pulses +2.      ASSESSMENT AND PLAN:  1.  Buttock and perineal abscess post drainage day #1.  Continue on Tygacil  and vancomycin. Culture and sensitivity are pending.    2.  Bacteremia based on patients history of fever and malaise, clinically  resolving.    3.  Cellulitis and abscess of right lower quadrant.  There is no fluctuance  and no need for drainage.  Continue to monitor.   4.  Reflux, stable.  5.  Depression, stable.                                 Willene Hatchet, MD     Horton Marshall [id number]  DD: 03/03/2009 09:13  DT: 03/03/2009 11:00  Job #: [id number]  CC: Verdene Rio, MD  CC: Willene Hatchet, MD

## 2009-03-03 NOTE — H&P (Unsigned)
PATIENT NAME                  PA #             MR #                  Cassandra Copeland, Cassandra Copeland                 [id number]       [id number]            [place]           Glade Nurse, MD               03/02/2009                     DATE OF BIRTH    AGE            PATIENT TYPE      RM #               [birth year]       53             [facility]               0217                  ADDENDUM     LABORATORY DATA:  CBC:  White count is 8.5.  Hematocrit is 37.5.  Platelets  are 307.  Urinalysis is negative.  Renal Panel:  Potassium is 3.3 and glucose  is 125, otherwise normal.  On 7/21, CBC showed white blood cell count of  12.2.                                  Willene Hatchet, MD     Horton Marshall [id number]  DD: 03/03/2009 09:15  DT: 03/03/2009 10:24  Job #: [id number]  CC: Verdene Rio, MD  CC: Willene Hatchet, MD

## 2009-03-04 LAB — CBC WITH AUTO DIFFERENTIAL
Basophils %: 0.6 % (ref 0.0–2.0)
Basophils Absolute: 0 10*3 (ref 0.0–0.2)
Eosinophils %: 3.5 % (ref 0.0–5.0)
Eosinophils Absolute: 0.2 10*3 (ref 0.0–0.6)
Granulocyte Absolute Count: 3.5 10*3 (ref 1.7–7.7)
Hematocrit: 40.3 % (ref 36.0–48.0)
Hemoglobin: 14.3 g/dL (ref 12.0–16.0)
Lymphocytes %: 38.6 % (ref 25.0–40.0)
Lymphocytes Absolute: 2.6 10*3 (ref 1.0–5.1)
MCH: 30.5 pg (ref 26–34)
MCHC: 35.5 g/dL (ref 31–36)
MCV: 85.8 fl (ref 80–100)
MPV: 7.6 fl (ref 5.0–10.5)
Monocytes %: 5.6 % (ref 0.0–8.0)
Monocytes Absolute: 0.4 10*3 (ref 0.0–0.95)
Platelets: 329 10*3 (ref 135–450)
RBC: 4.69 10*6 (ref 4.0–5.2)
RDW: 12.9 % (ref 11.5–14.5)
Segs Relative: 51.7 % (ref 42.0–63.0)
WBC: 6.9 10*3 (ref 4.0–11.0)

## 2009-03-04 LAB — BASIC METABOLIC PANEL
Anion Gap: 4.3
BUN: 12 mg/dL (ref 7–18)
CO2: 33 meq/L — ABNORMAL HIGH (ref 21–32)
Calcium: 9.1 mg/dL (ref 8.3–10.6)
Chloride: 103 meq/L (ref 99–110)
Creatinine: 0.7 mg/dL (ref 0.6–1.1)
GFR Est, African/Amer: >60
GFR, Estimated: >60 (ref >60)
Glucose: 98 mg/dL (ref 70–99)
Potassium: 3.7 meq/L (ref 3.5–5.1)
Sodium: 140 meq/L (ref 136–145)

## 2009-04-14 NOTE — Telephone Encounter (Signed)
appt made

## 2009-04-14 NOTE — Telephone Encounter (Signed)
Due ov 9-05/2009

## 2009-04-17 NOTE — Discharge Summary (Unsigned)
PATIENT NAME                  PA #             MR #                  DYLEN, BOVEN                 [id number]       [id number]            ATTENDING PHYSICIAN                   ADM DATE    DIS DATE           Glade Nurse, MD               03/02/2009  03/04/2009         DATE OF BIRTH    AGE            PATIENT TYPE                          [birth year]       53             [facility]                                      DISCHARGE DIAGNOSES:   1. MRSA abscess.  2. Surgical drainage of abscess.  3. Right lower quadrant abscess of skin.  4. Hypokalemia.  5. Hyperglycemia, reactive.     For full history and physical, please see admission note.       HOSPITAL COURSE:  The patient was admitted with perineal abscess that  required surgical drainage.  It was culture proven to be MRSA with  sensitivity to both clindamycin and Bactrim.  Hyperglycemia was monitored   Hypokalemia was repleted.  Her perineal abscess improved rapidly after  drainage and with antibiotics, and right lower quadrant skin lesion on  abdomen decreased as well without requiring drainage or developing  significant purulence.  The patient tolerated treatment well and was  discharged home in improved and good condition.      PHYSICAL EXAMINATION:  The day of discharge see progress notes.     LABORATORY DATA:  Renal panel: Sodium 140, potassium 3.7, chloride 103, CO2  33, glucose 98, BUN 12, creatinine 0.7.  CBC white count 6.9, hematocrit  40.3, platelets 329.  MRSA culture positive from left buttock culture.   Urinalysis  negative.  Sensitivity of MRSA to clindamycin and Bactrim,   Tetracycline and vancomycin.       DISCHARGE MEDICATIONS:    1. Oxycodone 10 mg orally every 4 hours as needed for pain.   2. Clindamycin 300 mg orally x3 daily.     DISPOSITION:  She is discharged in improved and good condition to follow up  with Dr. Tania Ade within 3 days.                                       Cassandra Hatchet, MD     Horton Marshall [id number]  DD: 04/17/2009 10:27  DT:  04/19/2009 13:32  Job #: [id number]  CC: Verdene Rio, MD  CC: Cassandra Hatchet, MD

## 2009-04-24 NOTE — Progress Notes (Signed)
Subjective:      Patient ID: Cassandra Copeland is a 53 y.o. female.    HPI  Chief Complaint   Patient presents with   ??? Gastrophageal Reflux-controlled without progressive sx;    ??? Other     fibromyalgia; occ knee pain; worst area is R hip; analgesic helpful   ??? Other     recurrent tinea cruris-R breast-not currently active           Review of Systems   Constitutional: Negative for fatigue and unexpected weight change.   Gastrointestinal: Negative.    Musculoskeletal: Positive for myalgias.   Skin: Positive for rash.        See cc   Psychiatric/Behavioral: Negative.        Objective:   Physical Exam   Vitals reviewed.  Constitutional: She appears well-developed and well-nourished.   HENT:   Head: Normocephalic.   Eyes: Conjunctivae are normal.   Neck: Neck supple. Carotid bruit is not present. No thyromegaly present.   Cardiovascular: Normal rate, regular rhythm and normal heart sounds.    Pulmonary/Chest: Effort normal. She has no wheezes. She has no rales.   Musculoskeletal: She exhibits no edema.   Lymphadenopathy:     She has no cervical adenopathy.     She has no axillary adenopathy.        Right: No supraclavicular adenopathy present.        Left: No supraclavicular adenopathy present.   Skin: Skin is warm. No rash noted.   Psychiatric: She has a normal mood and affect.       Assessment:      1. GERD (Gastroesophageal Reflux Disease) (530.81S)     2. Immunization, Influenza (V04.81L)  FLU VACCINE =>3YO SPLIT IM   3. Fibromyalgia (729.1AV)  hydrocodone-acetaminophen (VICODIN) 5-500 MG per tablet, citalopram (CELEXA) 40 MG tablet             Plan:      F/u 1 year

## 2009-05-13 NOTE — Telephone Encounter (Signed)
error 

## 2009-05-28 NOTE — Progress Notes (Signed)
Subjective:      Patient ID: Cassandra Copeland is a 53 y.o. female.    HPI  Chief Complaint   Patient presents with   . 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 color; 1 week hx of pndrip and associated sore throat and prod cough-clear to green; no fever; was in [place] last week           Review of Systems   Genitourinary: Negative.    Psychiatric/Behavioral:        Stress with mother in [place].       Objective:   Physical Exam   Vitals reviewed.  Constitutional: She appears well-developed.   HENT:   Head: Normocephalic.   Right Ear: Tympanic membrane and external ear normal.   Left Ear: Tympanic membrane and external ear normal.   Mouth/Throat: Oropharyngeal exudate (pnd) present.   Eyes: Conjunctivae are normal. No scleral icterus.   Neck: Neck supple.   Cardiovascular: Normal rate and normal heart sounds.    No murmur heard.  Pulmonary/Chest: Breath sounds normal. She has no wheezes. She has no rales.   Skin:        Mult small excoriated areas on L aarm and fewer on ant chest       Assessment:      1. 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

## 2009-06-16 ENCOUNTER — Ambulatory Visit: Payer: Self-pay | Admitting: Oncology

## 2009-06-18 ENCOUNTER — Encounter: Payer: Self-pay | Admitting: Internal Medicine

## 2009-06-18 ENCOUNTER — Ambulatory Visit (HOSPITAL_COMMUNITY): Admission: RE | Admit: 2009-06-18 | Discharge: 2009-06-18 | Payer: Self-pay | Admitting: Oncology

## 2009-06-18 LAB — CBC WITH DIFFERENTIAL/PLATELET
BASO%: 0.3 % (ref 0.0–2.0)
Basophils Absolute: 0 10*3/uL (ref 0.0–0.1)
EOS%: 0.6 % (ref 0.0–7.0)
HCT: 44.5 % (ref 34.8–46.6)
HGB: 15.7 g/dL (ref 11.6–15.9)
LYMPH%: 25.9 % (ref 14.0–49.7)
MCH: 32 pg (ref 25.1–34.0)
MCHC: 35.3 g/dL (ref 31.5–36.0)
MCV: 90.7 fL (ref 79.5–101.0)
NEUT%: 65.8 % (ref 38.4–76.8)
Platelets: 210 10*3/uL (ref 145–400)

## 2009-06-18 LAB — COMPREHENSIVE METABOLIC PANEL
ALT: 25 U/L (ref 0–35)
AST: 29 U/L (ref 0–37)
BUN: 8 mg/dL (ref 6–23)
Calcium: 9.5 mg/dL (ref 8.4–10.5)
Chloride: 103 mEq/L (ref 96–112)
Creatinine, Ser: 0.67 mg/dL (ref 0.40–1.20)
Total Bilirubin: 0.5 mg/dL (ref 0.3–1.2)

## 2009-06-29 NOTE — Telephone Encounter (Signed)
Ov 10/21. No follow up made.

## 2009-06-30 NOTE — Telephone Encounter (Signed)
fine

## 2009-07-14 ENCOUNTER — Ambulatory Visit: Payer: Self-pay | Admitting: Internal Medicine

## 2009-07-14 DIAGNOSIS — H6120 Impacted cerumen, unspecified ear: Secondary | ICD-10-CM | POA: Insufficient documentation

## 2009-07-19 LAB — CONVERTED CEMR LAB
Cholesterol: 153 mg/dL (ref 0–200)
TSH: 0.73 microintl units/mL (ref 0.35–5.50)
VLDL: 21.6 mg/dL (ref 0.0–40.0)

## 2009-08-08 HISTORY — PX: COLONOSCOPY: SHX174

## 2009-09-04 ENCOUNTER — Ambulatory Visit: Payer: Self-pay | Admitting: Internal Medicine

## 2009-09-04 DIAGNOSIS — K589 Irritable bowel syndrome without diarrhea: Secondary | ICD-10-CM | POA: Insufficient documentation

## 2009-09-04 DIAGNOSIS — F411 Generalized anxiety disorder: Secondary | ICD-10-CM | POA: Insufficient documentation

## 2009-09-15 ENCOUNTER — Encounter

## 2009-09-15 NOTE — Telephone Encounter (Signed)
Prescriptions faxed

## 2009-11-09 NOTE — Telephone Encounter (Signed)
Her sister was dx with hepatits b today. She had started to turn yellow.  She was with her for the whole last week drinking after her.  What does she to do?    [phone]

## 2009-11-09 NOTE — Telephone Encounter (Signed)
Patient notified

## 2009-11-09 NOTE — Telephone Encounter (Signed)
If you did not stick yourself with any needles,nothing needs to be done.

## 2009-12-02 ENCOUNTER — Encounter: Admission: RE | Admit: 2009-12-02 | Discharge: 2009-12-02 | Payer: Self-pay | Admitting: Oncology

## 2009-12-14 ENCOUNTER — Ambulatory Visit: Payer: Self-pay | Admitting: Oncology

## 2009-12-15 ENCOUNTER — Encounter: Payer: Self-pay | Admitting: Internal Medicine

## 2009-12-15 LAB — COMPREHENSIVE METABOLIC PANEL
ALT: 20 U/L (ref 0–35)
Alkaline Phosphatase: 72 U/L (ref 39–117)
CO2: 24 mEq/L (ref 19–32)
Creatinine, Ser: 0.65 mg/dL (ref 0.40–1.20)
Glucose, Bld: 117 mg/dL — ABNORMAL HIGH (ref 70–99)
Sodium: 141 mEq/L (ref 135–145)
Total Bilirubin: 0.5 mg/dL (ref 0.3–1.2)

## 2009-12-15 LAB — LACTATE DEHYDROGENASE: LDH: 191 U/L (ref 94–250)

## 2009-12-15 LAB — CBC WITH DIFFERENTIAL/PLATELET
BASO%: 0.2 % (ref 0.0–2.0)
HCT: 45.5 % (ref 34.8–46.6)
LYMPH%: 17.2 % (ref 14.0–49.7)
MCHC: 35.3 g/dL (ref 31.5–36.0)
MCV: 90.6 fL (ref 79.5–101.0)
MONO#: 0.3 10*3/uL (ref 0.1–0.9)
MONO%: 4.3 % (ref 0.0–14.0)
NEUT%: 78.1 % — ABNORMAL HIGH (ref 38.4–76.8)
Platelets: 215 10*3/uL (ref 145–400)
RBC: 5.02 10*6/uL (ref 3.70–5.45)
WBC: 7.3 10*3/uL (ref 3.9–10.3)

## 2010-03-02 ENCOUNTER — Ambulatory Visit: Payer: Self-pay | Admitting: Internal Medicine

## 2010-03-22 ENCOUNTER — Ambulatory Visit: Payer: Self-pay | Admitting: Internal Medicine

## 2010-03-23 LAB — CONVERTED CEMR LAB
CO2: 28 meq/L (ref 19–32)
Calcium: 9.3 mg/dL (ref 8.4–10.5)
Chloride: 104 meq/L (ref 96–112)
Free T4: 0.97 ng/dL (ref 0.60–1.60)
Glucose, Bld: 79 mg/dL (ref 70–99)
Potassium: 3.4 meq/L — ABNORMAL LOW (ref 3.5–5.1)
Sodium: 142 meq/L (ref 135–145)

## 2010-03-29 ENCOUNTER — Encounter

## 2010-04-02 NOTE — Progress Notes (Signed)
Subjective:      Patient ID: Cassandra Copeland is a 54 y.o. female.    HPI  Chief Complaint   Patient presents with   ??? Muscle Pain     fibromyalgia c/u refills;still with radicular pain R L5;no back pain;reviewed records;long hx of back and leg pain but was on other side;saw [place],mri/ct-showed L4-5 disease but more on R;now sx are on the right;   ??? Gastrophageal Reflux     controlled with prevacid;no progressive sx's   ??? Back Pain     sciatic pain;see above-does she have fibromyalgia?   uses 2 pills at night occasionally for pain so that she can sleep;no increase in that and she does not want to reinvestigate re disc disease given how little it bothers here at the current time.    Review of Systems   Constitutional: Negative.    Musculoskeletal: Positive for back pain (with rad down posterior R leg to foot).        See cc   Skin: Negative.    Neurological: Negative for weakness and numbness.   Psychiatric/Behavioral:        Raising her 15 yr old grandchild       Objective:   Physical Exam   Vitals reviewed.  Constitutional: She is oriented to person, place, and time. She appears well-developed and well-nourished.   HENT:   Head: Normocephalic.   Eyes: Conjunctivae are normal.   Neck: Neck supple. No thyromegaly present.   Cardiovascular: Normal rate, regular rhythm and normal heart sounds.    Pulmonary/Chest: Effort normal. She has no wheezes. She has no rales.   Abdominal: Soft. Bowel sounds are normal. No tenderness.   Musculoskeletal:        Lumbar back: She exhibits no tenderness.        SLR -/-   Lymphadenopathy:     She has no cervical adenopathy.   Neurological: She is alert and oriented to person, place, and time. Gait normal.   Reflex Scores:       Patellar reflexes are 1+ on the right side and 1+ on the left side.       Achilles reflexes are 1+ on the right side and 1+ on the left side.       SLR -/-   Skin: Skin is warm. No pallor.   Psychiatric: She has a normal mood and affect.       Assessment:      1.  Fibromyalgia  hydrocodone-acetaminophen (VICODIN) 5-500 MG per tablet, citalopram (CELEXA) 40 MG tablet   2. Neurodermatitis  triamcinolone (ARISTOCORT A) 0.1 % cream    ?Lumbar disc disease      Plan:      No change;w/u back at patient's decision

## 2010-05-03 ENCOUNTER — Ambulatory Visit: Payer: Self-pay | Admitting: Internal Medicine

## 2010-05-24 ENCOUNTER — Telehealth (INDEPENDENT_AMBULATORY_CARE_PROVIDER_SITE_OTHER): Payer: Self-pay | Admitting: *Deleted

## 2010-05-25 ENCOUNTER — Ambulatory Visit: Payer: Self-pay | Admitting: Internal Medicine

## 2010-06-16 ENCOUNTER — Ambulatory Visit: Payer: Self-pay | Admitting: Oncology

## 2010-06-18 ENCOUNTER — Encounter: Payer: Self-pay | Admitting: Internal Medicine

## 2010-06-18 ENCOUNTER — Ambulatory Visit (HOSPITAL_COMMUNITY): Admission: RE | Admit: 2010-06-18 | Discharge: 2010-06-18 | Payer: Self-pay | Admitting: Oncology

## 2010-06-18 LAB — CBC WITH DIFFERENTIAL/PLATELET
Basophils Absolute: 0 10*3/uL (ref 0.0–0.1)
HCT: 45.3 % (ref 34.8–46.6)
HGB: 16 g/dL — ABNORMAL HIGH (ref 11.6–15.9)
LYMPH%: 24.7 % (ref 14.0–49.7)
MONO#: 0.4 10*3/uL (ref 0.1–0.9)
NEUT%: 67.4 % (ref 38.4–76.8)
Platelets: 224 10*3/uL (ref 145–400)
WBC: 6.3 10*3/uL (ref 3.9–10.3)
lymph#: 1.6 10*3/uL (ref 0.9–3.3)

## 2010-06-18 LAB — COMPREHENSIVE METABOLIC PANEL
ALT: 22 U/L (ref 0–35)
BUN: 8 mg/dL (ref 6–23)
CO2: 28 mEq/L (ref 19–32)
Calcium: 9.6 mg/dL (ref 8.4–10.5)
Chloride: 104 mEq/L (ref 96–112)
Creatinine, Ser: 0.7 mg/dL (ref 0.40–1.20)
Glucose, Bld: 95 mg/dL (ref 70–99)

## 2010-06-18 LAB — LACTATE DEHYDROGENASE: LDH: 198 U/L (ref 94–250)

## 2010-06-28 NOTE — Telephone Encounter (Signed)
Prescriptions faxed

## 2010-06-28 NOTE — Telephone Encounter (Signed)
Message copied by Charlestine Massed on Mon Jun 28, 2010  3:25 PM  ------       Message from: Payton Emerald LYNN       Created: Mon Jun 28, 2010  3:14 PM         Patient wants refills on her Ambien & Atarax. She would like those sent to [facility]. She was told that they couldn't request from [place] because of the Ambien.

## 2010-09-02 ENCOUNTER — Encounter: Payer: Self-pay | Admitting: Internal Medicine

## 2010-09-07 ENCOUNTER — Ambulatory Visit
Admission: RE | Admit: 2010-09-07 | Discharge: 2010-09-07 | Payer: Self-pay | Source: Home / Self Care | Attending: Internal Medicine | Admitting: Internal Medicine

## 2010-09-07 ENCOUNTER — Encounter: Payer: Self-pay | Admitting: Internal Medicine

## 2010-09-07 NOTE — Letter (Signed)
Summary: [facility]  [facility]   Imported By: Lanelle Bal 07/02/2010 11:38:43  _____________________________________________________________________  External Attachment:    Type:   Image     Comment:   External Document

## 2010-09-07 NOTE — Assessment & Plan Note (Signed)
Summary: rto 6 weeks/cbs   Vital Signs:  Patient profile:   55 year old female Weight:      133.8 pounds Temp:     98.8 degrees F oral Pulse rate:   102 / minute Resp:     16 per minute BP sitting:   148 / 90  (right arm) Cuff size:   regular  Vitals Entered By: Shonna Chock CMA (May 03, 2010 10:06 AM) CC: 6 week follow-up on B/P, compared cuffs, patient's cuff 148/90 p:120, URI symptoms   CC:  6 week follow-up on B/P, compared cuffs, patient's cuff 148/90 p:120, and URI symptoms.  History of Present Illness:    Hypertension Follow-Up      This is a 55 year old woman who presents for Hypertension follow-up.  The patient denies lightheadedness, urinary frequency, headaches, and fatigue.  Associated symptoms include occasional palpitations , non exertional.  The patient denies the following associated symptoms: chest pain, chest pressure, exercise intolerance, dyspnea, syncope, leg edema, and pedal edema.  The patient reports exercising daily as walking 1 mpd .  Adjunctive measures currently used by the patient include salt restriction.  See BP ; her cuff measured 145/94  & ours 148/90. BP @ home : 114/68- 138/80.                        Axiety: Fluoxetine is effective.She describes "white coat" syndrome with acceleration of BP & pulse @ MD appts since her cancer. "Every time I have a symptom ; I think it's cancer".    URI Symptoms      The patient also presents with URI symptoms X 2 weeks as L earache w/o discharge (see comment above about cancer fear with new symptoms). Initially she had ST also. She  now  denies nasal congestion, purulent nasal discharge, sore throat, and productive cough.  The patient denies fever.  The patient denies headache.  The patient denies the following risk factors for Strep sinusitis: unilateral facial pain, tooth pain, and tender adenopathy.    Current Medications (verified): 1)  Clidinium-Chlordiazepoxide 2.5-5 Mg Caps (Clidinium-Chlordiazepoxide) .Marland Kitchen.. 1  Q 6-8 As Needed 2)  Fluoxetine Hcl 10 Mg Caps (Fluoxetine Hcl) .Marland Kitchen.. 1 Once Daily  Allergies: 1)  ! Sulfa  Review of Systems General:  Denies sleep disorder; Appetite fair. Psych:  Denies anxiety, depression, easily angered, easily tearful, irritability, and panic attacks.  Physical Exam  General:  well-nourished; alert,appropriate and cooperative throughout examination Ears:  External ear exam shows no significant lesions or deformities.  Otoscopic examination reveals  cerumen R > L Nose:  External nasal examination shows no deformity or inflammation. Nasal mucosa are pink and moist without lesions or exudates. Mouth:  Oral mucosa and oropharynx without lesions or exudates.  Teeth in good repair. Neck:  No deformities, masses, or tenderness noted. Lungs:  Normal respiratory effort, chest expands symmetrically. Lungs are clear to auscultation, no crackles or wheezes. Heart:  regular rhythm, no murmur, no rub, no JVD, and tachycardia(" I get anxious coming here").  S4 Pulses:  R and L carotid,radial,dorsalis pedis and posterior tibial pulses are full and equal bilaterally Extremities:  No clubbing, cyanosis, edema. Neurologic:  alert & oriented X3.   Psych:  normally interactive, good eye contact, but  slightly anxious.     Impression & Recommendations:  Problem # 1:  EAR PAIN, LEFT (ICD-388.70)  Her updated medication list for this problem includes:    Coly-mycin S 3.10-08-08-0.5 Mg/ml Susp (  Neomycin-colist-hc-thonzonium) .Marland KitchenMarland KitchenMarland KitchenMarland Kitchen 3 drops three times a day to l ear  Problem # 2:  ELEVATED BLOOD PRESSURE WITHOUT DIAGNOSIS OF HYPERTENSION (ICD-796.2) BP controlled by home records The following medications were removed from the medication list:    Spironolactone 25 Mg Tabs (Spironolactone) .Marland Kitchen... 1 once daily if bp averages > 135/85  Problem # 3:  ANXIETY STATE, UNSPECIFIED (ICD-300.00) Improved by history The following medications were removed from the medication list:    Lorazepam 0.5  Mg Tabs (Lorazepam) .Marland Kitchen... 1 every 8 hrs as needed  anxiety Her updated medication list for this problem includes:    Fluoxetine Hcl 10 Mg Caps (Fluoxetine hcl) .Marland Kitchen... 2 once daily  until completed( if effective Rx will be changed to 20 mg once daily )  Complete Medication List: 1)  Clidinium-chlordiazepoxide 2.5-5 Mg Caps (Clidinium-chlordiazepoxide) .Marland Kitchen.. 1 q 6-8 as needed 2)  Fluoxetine Hcl 10 Mg Caps (Fluoxetine hcl) .... 2 once daily  until cmpleted( if effective rx will be changed to 20 mg once daily ) 3)  Coly-mycin S 3.10-08-08-0.5 Mg/ml Susp (Neomycin-colist-hc-thonzonium) .... 3 drops three times a day to l ear  Patient Instructions: 1)  Thin wash rag after shower to wick out wax. Long term  for wax buildup  use 3 drops of mineral oil at bedtime & cover with cotton ball.In am fill ear with hydrogen peroxide X 10-15 min , then shower. 2)  Check your Blood Pressure regularly. If it is above: 135/85 ON AVERAGE  you should make an appointment. Prescriptions: COLY-MYCIN S 3.10-08-08-0.5 MG/ML SUSP (NEOMYCIN-COLIST-HC-THONZONIUM) 3 drops three times a day to L ear  #10cc x 0   Entered and Authorized by:   Marga Melnick MD   Signed by:   Marga Melnick MD on 05/03/2010   Method used:   Print then Give to Patient   RxID:   [id number]

## 2010-09-07 NOTE — Assessment & Plan Note (Signed)
Summary: bp up/cbs   Vital Signs:  Patient profile:   55 year old female Resp:     14 per minute BP sitting:   138 / 90  History of Present Illness: Hypertension Follow-Up      This is a 55 year old woman who presents for Hypertension follow-up. She is accompanied by her husband. The patient reports urinary frequency chronically , but denies lightheadedness, headaches, and edema.  Associated symptoms include occasional  palpitations in am & tingling across cheeks.  The patient denies the following associated symptoms: chest pain, chest pressure, exercise intolerance, dyspnea, syncope, leg edema, and pedal edema.  The patient reports that dietary compliance has been good.  The patient reports exercising daily.  Adjunctive measures currently used by the patient include salt restriction.  BP average 131/80 w/o Spironolactone. BP with her cuff was 152/92, 140/100.No paroxysmal headache, chest pain, flushing & diarrhea. PMH of hypokalemia.  Allergies: 1)  ! Sulfa  Review of Systems GI:  Complains of loss of appetite; denies abdominal pain, bloody stools, dark tarry stools, nausea, and vomiting. Psych:  Complains of anxiety, depression, and easily tearful; denies easily angered and irritability; She began to" get down" due to her mother's health issues & death.Her son got married this weekend. Prev on Prozac ; it was started while on chemotherapy. ? anxiety attack last night.  Physical Exam  General:  well-nourished; alert,appropriate and cooperative throughout examination Eyes:  No corneal or conjunctival inflammation noted.No lid lag Neck:  No deformities, masses, or tenderness noted. Lungs:  Normal respiratory effort, chest expands symmetrically. Lungs are clear to auscultation, no crackles or wheezes. Heart:  regular rhythm, no murmur, no gallop, no rub, no JVD, and tachycardia.  S4 Pulses:  R and L carotid,radial,dorsalis pedis and posterior tibial pulses are full and equal  bilaterally Neurologic:  alert & oriented X3 and DTRs symmetrical and normal. Very slight hand tremor  Skin:  Intact without suspicious lesions or rashes Psych:  memory intact for recent and remote and slightly anxious.     Impression & Recommendations:  Problem # 1:  ELEVATED BLOOD PRESSURE WITHOUT DIAGNOSIS OF HYPERTENSION (ICD-796.2)  Her updated medication list for this problem includes:    Spironolactone 25 Mg Tabs (Spironolactone) .Marland Kitchen... 1 once daily if bp averages > 135/85  Orders: Venipuncture (41324) TLB-BMP (Basic Metabolic Panel-BMET) (80048-METABOL) Specimen Handling (40102)  Problem # 2:  ANXIETY STATE, UNSPECIFIED (ICD-300.00)  Her updated medication list for this problem includes:    Fluoxetine Hcl 10 Mg Caps (Fluoxetine hcl) .Marland Kitchen... 1 once daily    Lorazepam 0.5 Mg Tabs (Lorazepam) .Marland Kitchen... 1 every 8 hrs as needed  anxiety  Orders: TLB-BMP (Basic Metabolic Panel-BMET) (80048-METABOL) TLB-TSH (Thyroid Stimulating Hormone) (84443-TSH) TLB-T4 (Thyrox), Free [phone]) Specimen Handling (34742)  Problem # 3:  HYPOPOTASSEMIA (ICD-276.8) PMH of Orders: TLB-BMP (Basic Metabolic Panel-BMET) (80048-METABOL) Specimen Handling (59563)  Complete Medication List: 1)  Calcium 500/d 500-200 Mg-unit Tabs (Calcium carbonate-vitamin d) .Marland Kitchen.. 1 by mouth once daily 2)  Clidinium-chlordiazepoxide 2.5-5 Mg Caps (Clidinium-chlordiazepoxide) .Marland Kitchen.. 1 q 6-8 as needed 3)  Spironolactone 25 Mg Tabs (Spironolactone) .Marland Kitchen.. 1 once daily if bp averages > 135/85 4)  Fluoxetine Hcl 10 Mg Caps (Fluoxetine hcl) .Marland Kitchen.. 1 once daily 5)  Lorazepam 0.5 Mg Tabs (Lorazepam) .Marland Kitchen.. 1 every 8 hrs as needed  anxiety  Patient Instructions: 1)  Review the Neurotransmitter Deficiency diagram we discussed .  2)  Please schedule a follow-up appointment in 6 weeks. 3)  Check your Blood Pressure  regularly. your goal = AVERAGE < 135/85 Prescriptions: LORAZEPAM 0.5 MG TABS (LORAZEPAM) 1 every 8 hrs as needed  anxiety   #30 x 1   Entered and Authorized by:   Marga Melnick MD   Signed by:   Marga Melnick MD on 03/22/2010   Method used:   Print then Give to Patient   RxID:   [id number] FLUOXETINE HCL 10 MG CAPS (FLUOXETINE HCL) 1 once daily  #90 x 1   Entered and Authorized by:   Marga Melnick MD   Signed by:   Marga Melnick MD on 03/22/2010   Method used:   Print then Give to Patient   RxID:   [id number]

## 2010-09-07 NOTE — Letter (Signed)
[facility]   Imported By: Lanelle Bal 01/05/2010 14:03:10  _____________________________________________________________________  External Attachment:    Type:   Image     Comment:   External Document

## 2010-09-07 NOTE — Progress Notes (Signed)
Summary: NEEDS PRESCRIPTION FOR FLUOXETINE 20 MG  Phone Note Refill Request Call back at Home Phone [phone] Message from:  Patient  Refills Requested: Medication #1:  FLUOXETINE HCL 10 MG CAPS 2 once daily  until cmpleted( if effective Rx will be changed to 20 mg once daily )   Notes: SEE NOTE PATIENT WAS GIVEN PRESCRIPTION FOR 10MG  , BUT WAS TOLD TO TAKE 2 AT NIGHT ----SHE SAYS THE 20 MG IS WORKING AND SHE SAYS SHE HAS THREE DAYS LEFT OF MEDICATION---PLEASE CALL IN A 90 DAY PRESCRIPTION FOR THE ***20 MG*** TO [facility] ON [address]  Initial call taken by: Jerolyn Shin,  May 24, 2010 12:55 PM    New/Updated Medications: FLUOXETINE HCL 20 MG CAPS (FLUOXETINE HCL) 1 by mouth once daily Prescriptions: FLUOXETINE HCL 20 MG CAPS (FLUOXETINE HCL) 1 by mouth once daily  #90 x 1   Entered by:   Shonna Chock CMA   Authorized by:   Marga Melnick MD   Signed by:   Shonna Chock CMA on 05/24/2010   Method used:   Electronically to        [facility] [address]* (retail)       [address]       [place], [place]  [address]       Ph: [phone]       Fax: [phone]   RxID:   [id number]

## 2010-09-07 NOTE — Assessment & Plan Note (Signed)
Summary: earache/cbs   Vital Signs:  Patient profile:   55 year old female Weight:      135.6 pounds BMI:     23.18 Temp:     98.5 degrees F oral Pulse rate:   98 / minute Resp:     15 per minute BP sitting:   159 / 98  (left arm) Cuff size:   regular  Vitals Entered By: Shonna Chock CMA (March 02, 2010 12:07 PM) CC: Ears feel clogged-left is worse   CC:  Ears feel clogged-left is worse.  History of Present Illness: Ear pressure ? with hearing loss  for several weeks w/o trigger such as swimming or flying. Rx: OTC drops helped. See BP; it had been normal until  last week with stresses. She lost her mother last month & her son is getting married.Two sisters are on meds for BP . She describes "White Coat " Syndrome.  Current Medications (verified): 1)  Calcium 500/d 500-200 Mg-Unit Tabs (Calcium Carbonate-Vitamin D) .Marland Kitchen.. 1 By Mouth Once Daily 2)  Clidinium-Chlordiazepoxide 2.5-5 Mg Caps (Clidinium-Chlordiazepoxide) .Marland Kitchen.. 1 Q 6-8 As Needed  Allergies: 1)  ! Sulfa  Review of Systems General:  Denies fever and sweats; "mini chills " yesterday. ENT:  Denies hoarseness; No frontal headache, facial pain or purulence.  Physical Exam  General:  in no acute distress; alert,appropriate and cooperative throughout examination Ears:  Wax  L > R. Subjectively decreased hearing on L. Nose:  External nasal examination shows no deformity or inflammation. Nasal mucosa are pink and moist without lesions or exudates. Mouth:  Oral mucosa and oropharynx without lesions or exudates.  Teeth in good repair. Heart:  Normal rate and regular rhythm. S1 and S2 normal without gallop, murmur, click, rub .S4  Pulses:  R and L radial,dorsalis pedis and posterior tibial pulses are full and equal bilaterally Extremities:  No clubbing, cyanosis, edema. Cervical Nodes:  No lymphadenopathy noted Axillary Nodes:  No palpable lymphadenopathy   Impression & Recommendations:  Problem # 1:  CERUMEN IMPACTION  (ICD-380.4) hearing loss resolved post removal of impaction  Problem # 2:  ELEVATED BLOOD PRESSURE WITHOUT DIAGNOSIS OF HYPERTENSION (ICD-796.2)  Her updated medication list for this problem includes:    Spironolactone 25 Mg Tabs (Spironolactone) .Marland Kitchen... 1 once daily if bp averages > 135/85  Complete Medication List: 1)  Calcium 500/d 500-200 Mg-unit Tabs (Calcium carbonate-vitamin d) .Marland Kitchen.. 1 by mouth once daily 2)  Clidinium-chlordiazepoxide 2.5-5 Mg Caps (Clidinium-chlordiazepoxide) .Marland Kitchen.. 1 q 6-8 as needed 3)  Spironolactone 25 Mg Tabs (Spironolactone) .Marland Kitchen.. 1 once daily if bp averages > 135/85  Patient Instructions: 1)  Check your Blood Pressure regularly. If it is above: 135/85 ON AVERAGE fill the Rx for Spironolactone. Prescriptions: SPIRONOLACTONE 25 MG TABS (SPIRONOLACTONE) 1 once daily if BP averages > 135/85  #30 x 2   Entered and Authorized by:   Marga Melnick MD   Signed by:   Marga Melnick MD on 03/02/2010   Method used:   Print then Give to Patient   RxID:   [id number]

## 2010-09-07 NOTE — Assessment & Plan Note (Signed)
Summary: burping/gyn stated her stomach is extended/kdc   Vital Signs:  Patient profile:   55 year old female Weight:      138.2 pounds Temp:     99.5 degrees F oral Pulse rate:   84 / minute Resp:     16 per minute BP sitting:   138 / 82  (left arm) Cuff size:   regular  Vitals Entered By: Shonna Chock (September 04, 2009 2:00 PM) CC: Patient was told per GYN stomach is swollen and that worries patient due to a history of cancer. Comments REVIEWED MED LIST, PATIENT AGREED DOSE AND INSTRUCTION CORRECT    CC:  Patient was told per GYN stomach is swollen and that worries patient due to a history of cancer.Marland Kitchen  History of Present Illness: Dr Chevis Pretty noted some "stomach swelling" ; [place] normal & Gyn exam this week were normal.She has  some anxiety &  burping esp after seeing "[employer] " @ [employer]. Colonscopy due 2017. PMH of IBS with mild symptoms  & ? lactose intolerance.  Allergies: 1)  ! Sulfa  Family History: Father: health unknown Mother: DM,goiter, anxiety Siblings: 2  sisters HTN ;sister RA; MGM IDDM;MGF  d in 2s; son Ophth disease , ? RP variant; son von Willebrand's   Review of Systems General:  Denies chills, fever, sweats, and weight loss. ENT:  Denies difficulty swallowing and hoarseness. GI:  Denies abdominal pain, bloody stools, dark tarry stools, and indigestion.  Physical Exam  General:  well-nourished,in no acute distress; alert,appropriate and cooperative throughout examination Eyes:  No corneal or conjunctival inflammation noted.No icterus  Mouth:  Oral mucosa and oropharynx without lesions or exudates.  Teeth in good repair. no pharyngeal erythema.   Heart:  regular rhythm and tachycardia.   Abdomen:  Bowel sounds positive,abdomen soft and non-tender without masses, organomegaly or hernias noted. Skin:  Intact without suspicious lesions or rashes Cervical Nodes:  No lymphadenopathy noted Axillary Nodes:  No palpable lymphadenopathy Psych:   Oriented X3 and slightly anxious.     Impression & Recommendations:  Problem # 1:  IRRITABLE BOWEL SYNDROME (ICD-564.1)  Problem # 2:  ANXIETY STATE, UNSPECIFIED (ICD-300.00)  Complete Medication List: 1)  Calcium 500/d 500-200 Mg-unit Tabs (Calcium carbonate-vitamin d) .Marland Kitchen.. 1 by mouth once daily 2)  Clidinium-chlordiazepoxide 2.5-5 Mg Caps (Clidinium-chlordiazepoxide) .Marland Kitchen.. 1 q 6-8 as needed  Patient Instructions: 1)  Align once daily for loose stool as needed. Complete stool cards. Prescriptions: CLIDINIUM-CHLORDIAZEPOXIDE 2.5-5 MG CAPS (CLIDINIUM-CHLORDIAZEPOXIDE) 1 q 6-8 as needed  #30 x 5   Entered and Authorized by:   Marga Melnick MD   Signed by:   Marga Melnick MD on 09/04/2009   Method used:   Print then Give to Patient   RxID:   [id number]

## 2010-09-07 NOTE — Assessment & Plan Note (Signed)
Summary: FLU SHOT/NTA   Nurse Visit  CC: Flu shot./kb   Allergies: 1)  ! Sulfa  Orders Added: 1)  Admin 1st Vaccine [90471] 2)  Flu Vaccine 15yrs + [30160]                    Flu Vaccine Consent Questions     Do you have a history of severe allergic reactions to this vaccine? no    Any prior history of allergic reactions to egg and/or gelatin? no    Do you have a sensitivity to the preservative Thimersol? no    Do you have a past history of Guillan-Barre Syndrome? no    Do you currently have an acute febrile illness? no    Have you ever had a severe reaction to latex? no    Vaccine information given and explained to patient? yes    Are you currently pregnant? no    Lot Number:[id number]   Exp Date:02/04/2010   Site Given  Right Deltoid IMu

## 2010-09-14 ENCOUNTER — Other Ambulatory Visit: Payer: Self-pay | Admitting: Internal Medicine

## 2010-09-14 ENCOUNTER — Encounter (INDEPENDENT_AMBULATORY_CARE_PROVIDER_SITE_OTHER): Payer: Self-pay | Admitting: *Deleted

## 2010-09-14 ENCOUNTER — Other Ambulatory Visit (INDEPENDENT_AMBULATORY_CARE_PROVIDER_SITE_OTHER): Payer: No Typology Code available for payment source

## 2010-09-14 DIAGNOSIS — Z0000 Encounter for general adult medical examination without abnormal findings: Secondary | ICD-10-CM

## 2010-09-14 LAB — LIPID PANEL
Cholesterol: 179 mg/dL (ref 0–200)
HDL: 50.8 mg/dL (ref >39.00)
LDL Cholesterol: 102 mg/dL — ABNORMAL HIGH (ref 0–99)
Triglycerides: 133 mg/dL (ref 0.0–149.0)
VLDL: 26.6 mg/dL (ref 0.0–40.0)

## 2010-09-15 NOTE — Assessment & Plan Note (Addendum)
Summary: cpx and fasting labs///sph   Vital Signs:  Patient profile:   55 year old female Height:      64.5 inches Weight:      137 pounds BMI:     23.24 Temp:     98.7 degrees F oral Pulse rate:   72 / minute Resp:     14 per minute BP sitting:   128 / 80  (left arm) Cuff size:   regular  Vitals Entered By: Shonna Chock CMA (September 07, 2010 8:27 AM)    History of Present Illness:    Mrs. Belnap is here for a physical; she is asymptomatic. Hgb was ? 17; it was 16  in 06/2010 @ Onc appt. NON fasting glucose was 117 in 11/11. FH of DM . Dr Mursinson's note stateed chemistries were pending.PMH of hypokalemia.  Current Medications (verified): 1)  Clidinium-Chlordiazepoxide 2.5-5 Mg Caps (Clidinium-Chlordiazepoxide) .Marland Kitchen.. 1 Q 6-8 As Needed 2)  Fluoxetine Hcl 20 Mg Caps (Fluoxetine Hcl) .Marland Kitchen.. 1 By Mouth Once Daily  Allergies: 1)  ! Sulfa  Past History:  Past Medical History: Hypokalemia, PMH of  (3.3 in 06/2008) Breast cancer,PMH  of, Dr Vedia Pereyra Sulfa allergy                                                                                                                                                                 Elevated BP w/o HTN, PMH of ("White Coat Syndrome")  Past Surgical History: Breast Cancer, S/P  Lumpectomy 2004, Chemotherapy followed by Radiation; G 3 P 2; C section ; Bunionectomy bilaterally  ; Colonoscopy 2007 negative, due 2017  Family History: Father: health history  unknown Mother: DM,goiter, anxiety Siblings: 2  sisters:HTN ;sister: RA; MGM: IDDM;MGF:  d in 14s ?cause; son: Ophth disease ,? RP variant; son :von Pharmacologist   Social History: Occupation: Clinical biochemist Married Never Smoked Alcohol use-no Regular exercise-yes:  walking daily  40-45 min  Review of Systems       Hands swell when dependent .  Physical Exam  General:  Thin,well-nourished,alert,appropriate and cooperative throughout examination Head:  Normocephalic and atraumatic  without obvious abnormalities. . Eyes:  No corneal or conjunctival inflammation noted.  Perrla. Funduscopic exam benign, without hemorrhages, exudates or papilledema.  Ears:  External ear exam shows no significant lesions or deformities.  Otoscopic examination reveals  : wax , R > L Nose:  External nasal examination shows no deformity or inflammation. Nasal mucosa are pink and moist without lesions or exudates. Mouth:  Oral mucosa and oropharynx without lesions or exudates.  Teeth in good repair. Neck:  No deformities, masses, or tenderness noted. Lungs:  Normal respiratory effort, chest expands symmetrically. Lungs are clear to auscultation, no crackles or wheezes. Heart:  Normal rate and regular rhythm.  S1 and S2 normal without gallop, murmur, click, rub .S 4 Abdomen:  Bowel sounds positive,abdomen soft and non-tender without masses, organomegaly or hernias noted. Genitalia:  Dr Chevis Pretty Msk:  No deformity or scoliosis noted of thoracic or lumbar spine.   Pulses:  R and L carotid,radial,dorsalis pedis and posterior tibial pulses are full and equal bilaterally Extremities:  No clubbing, cyanosis, edema. Minor DIP hand  deformity noted with normal full range of motion of all joints.   Neurologic:  alert & oriented X3 and DTRs symmetrical and normal.   Skin:  Intact without suspicious lesions or rashes Cervical Nodes:  No lymphadenopathy noted Axillary Nodes:  No palpable lymphadenopathy Psych:  memory intact for recent and remote, normally interactive, good eye contact, and not anxious appearing.     Impression & Recommendations:  Problem # 1:  ROUTINE GENERAL MEDICAL EXAM@HEALTH  CARE FACL (ICD-V70.0)  Orders: EKG w/ Interpretation (93000)  Problem # 2:  IRRITABLE BOWEL SYNDROME (ICD-564.1) Quiescent; Align once daily as needed   Problem # 3:  ANXIETY STATE, UNSPECIFIED (ICD-300.00) Excellent control; asymptomatic Her updated medication list for this problem includes:    Fluoxetine Hcl  20 Mg Caps (Fluoxetine hcl) .Marland Kitchen... 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 1 q 6-8 as needed 2)  Fluoxetine Hcl 20 Mg Caps (Fluoxetine hcl) .Marland Kitchen.. 1 by mouth once daily  Patient Instructions: 1)  Please obtain results of labs from Dr Darryl Nestle & Dr Chevis Pretty done since 06/2010. An A1c  & possibly TSH  & lipids may be ordered if not done. Prescriptions: FLUOXETINE HCL 20 MG CAPS (FLUOXETINE HCL) 1 by mouth once daily  #90 x 3   Entered and Authorized by:   Marga Melnick MD   Signed by:   Marga Melnick MD on 09/07/2010   Method used:   Print then Give to Patient   RxID:   [id number] CLIDINIUM-CHLORDIAZEPOXIDE 2.5-5 MG CAPS (CLIDINIUM-CHLORDIAZEPOXIDE) 1 q 6-8 as needed  #30 Capsule x 5   Entered and Authorized by:   Marga Melnick MD   Signed by:   Marga Melnick MD on 09/07/2010   Method used:   Print then Give to Patient   RxID:   [id number]    Orders Added: 1)  Est. Patient 40-64 years [99396] 2)  EKG w/ Interpretation [93000]   Immunization History:  Tetanus/Td Immunization History:    Tetanus/Td:  historical (08/09/2007)   Immunization History:  Tetanus/Td Immunization History:    Tetanus/Td:  Historical (08/09/2007)   Appended Document: cpx and fasting labs///sph Labs 06/18/2010 from Dr Arline Asp reviewed; fasting lipids & TSH not done. These should be scheduled fasting( V70.0)  Appended Document: Needs appt      Phone Note Outgoing Call   Details for Reason: Labs 06/18/2010 from Dr Arline Asp reviewed; fasting lipids & TSH not done. These should be scheduled fasting( V70.0)   Signed by Marga Melnick MD on 09/09/2010 at 4:53 PM Summary of Call: Left message on machine to call back to office. Lucious Groves CMA  September 10, 2010 8:34 AM   Patient aware and appt made. Lucious Groves CMA  September 10, 2010 8:54 AM    Follow-up for Phone Call        Patient is aware.Harold Barban  September 10, 2010 8:45 AM      Appended Document: cpx and fasting labs///sph

## 2010-11-16 ENCOUNTER — Other Ambulatory Visit: Payer: Self-pay | Admitting: Dermatology

## 2010-11-16 ENCOUNTER — Other Ambulatory Visit (HOSPITAL_COMMUNITY): Payer: Self-pay | Admitting: Oncology

## 2010-11-16 DIAGNOSIS — Z9889 Other specified postprocedural states: Secondary | ICD-10-CM

## 2010-11-16 DIAGNOSIS — Z853 Personal history of malignant neoplasm of breast: Secondary | ICD-10-CM

## 2010-11-19 MED ORDER — HYDROCODONE-ACETAMINOPHEN 5-500 MG PO TABS
5-500 MG | ORAL_TABLET | Freq: Four times a day (QID) | ORAL | Status: DC | PRN
Start: 2010-11-19 — End: 2011-06-15

## 2010-11-19 MED ORDER — MONTELUKAST SODIUM 10 MG PO TABS
10 MG | ORAL_TABLET | Freq: Every evening | ORAL | Status: DC
Start: 2010-11-19 — End: 2011-04-25

## 2010-11-19 MED ORDER — VARICELLA VIRUS VACCINE LIVE SC INJ
Freq: Once | SUBCUTANEOUS | Status: AC
Start: 2010-11-19 — End: 2010-11-19

## 2010-11-19 MED ORDER — CYCLOBENZAPRINE HCL 10 MG PO TABS
10 MG | ORAL_TABLET | Freq: Three times a day (TID) | ORAL | Status: DC | PRN
Start: 2010-11-19 — End: 2012-03-23

## 2010-11-19 MED ORDER — ZOLPIDEM TARTRATE 10 MG PO TABS
10 MG | ORAL_TABLET | Freq: Every evening | ORAL | Status: DC | PRN
Start: 2010-11-19 — End: 2011-06-15

## 2010-11-19 MED ORDER — CITALOPRAM HYDROBROMIDE 40 MG PO TABS
40 MG | ORAL_TABLET | Freq: Every day | ORAL | Status: DC
Start: 2010-11-19 — End: 2011-05-07

## 2010-11-19 MED ORDER — TRIAMCINOLONE ACETONIDE 0.1 % EX CREA
0.1 % | CUTANEOUS | Status: DC
Start: 2010-11-19 — End: 2011-06-15

## 2010-11-19 NOTE — Progress Notes (Signed)
Subjective:      Patient ID: Cassandra Copeland is a 55 y.o. female.    HPI  Chief Complaint   Patient presents with   ??? Herpes Zoster   ??? Medication Refill   ??? Anxiety celexa working well   ??? Leg Pain     right x 1 week;anterior lower leg;no trauma;worse if walks on;no back pain;is exercising more;   ??? Immunizations     wants a script for Varicella        Review of Systems   Constitutional: Negative for fatigue.   Respiratory: Negative.    Cardiovascular: Negative.    Musculoskeletal: Negative for gait problem.        See cc   Psychiatric/Behavioral: Negative.        Objective:   Physical Exam   Vitals reviewed.  Constitutional: She appears well-developed and well-nourished.   HENT:   Head: Normocephalic.   Eyes: Conjunctivae are normal.   Neck: Neck supple. Carotid bruit is not present.   Cardiovascular: Normal rate, regular rhythm and normal heart sounds.    No murmur heard.  Pulses:       Popliteal pulses are 2+ on the right side, and 2+ on the left side.        Dorsalis pedis pulses are 2+ on the right side, and 2+ on the left side.        Posterior tibial pulses are 2+ on the right side, and 2+ on the left side.   Pulmonary/Chest: Effort normal and breath sounds normal.   Abdominal: Soft. She exhibits no mass. No tenderness.   Musculoskeletal:        Right lower leg: Normal. She exhibits no tenderness, no bony tenderness and no swelling.        Left lower leg: Normal.   Skin: Skin is warm. No rash noted. No pallor.   Psychiatric: She has a normal mood and affect.       Assessment:      1. Fibromyalgia  cyclobenzaprine (FLEXERIL) 10 MG tablet, hydrocodone-acetaminophen (VICODIN) 5-500 MG per tablet, citalopram (CELEXA) 40 MG tablet   2. Neurodermatitis  hydrOXYzine (ATARAX) 50 MG tablet, triamcinolone (ARISTOCORT A) 0.1 % cream   3. Need for shingles vaccine  varicella virus vaccine, live, injection          Plan:      Return in about 6 months (around 05/21/2011).

## 2010-12-06 ENCOUNTER — Other Ambulatory Visit (HOSPITAL_COMMUNITY): Payer: Self-pay | Admitting: Oncology

## 2010-12-06 ENCOUNTER — Ambulatory Visit
Admission: RE | Admit: 2010-12-06 | Discharge: 2010-12-06 | Disposition: A | Discharge status: Home / Self Care | Payer: No Typology Code available for payment source | Source: Ambulatory Visit | Attending: Oncology | Admitting: Oncology

## 2010-12-06 DIAGNOSIS — Z853 Personal history of malignant neoplasm of breast: Secondary | ICD-10-CM

## 2010-12-06 DIAGNOSIS — Z9889 Other specified postprocedural states: Secondary | ICD-10-CM

## 2010-12-16 ENCOUNTER — Other Ambulatory Visit (HOSPITAL_COMMUNITY): Payer: Self-pay | Admitting: Oncology

## 2010-12-16 ENCOUNTER — Encounter (HOSPITAL_BASED_OUTPATIENT_CLINIC_OR_DEPARTMENT_OTHER): Payer: No Typology Code available for payment source | Admitting: Oncology

## 2010-12-16 DIAGNOSIS — Z170 Estrogen receptor positive status [ER+]: Secondary | ICD-10-CM

## 2010-12-16 DIAGNOSIS — C50119 Malignant neoplasm of central portion of unspecified female breast: Secondary | ICD-10-CM

## 2010-12-16 LAB — CBC WITH DIFFERENTIAL/PLATELET
BASO%: 0.4 % (ref 0.0–2.0)
EOS%: 0.8 % (ref 0.0–7.0)
HCT: 45.4 % (ref 34.8–46.6)
LYMPH%: 28 % (ref 14.0–49.7)
MCH: 31.9 pg (ref 25.1–34.0)
MCHC: 35 g/dL (ref 31.5–36.0)
MCV: 91.2 fL (ref 79.5–101.0)
MONO%: 7.5 % (ref 0.0–14.0)
NEUT%: 63.3 % (ref 38.4–76.8)
Platelets: 208 10*3/uL (ref 145–400)

## 2010-12-16 LAB — COMPREHENSIVE METABOLIC PANEL
AST: 28 U/L (ref 0–37)
Alkaline Phosphatase: 70 U/L (ref 39–117)
BUN: 10 mg/dL (ref 6–23)
Creatinine, Ser: 0.67 mg/dL (ref 0.40–1.20)

## 2010-12-24 NOTE — Op Note (Signed)
NAME:  TERRA, AVENI                           ACCOUNT NO.:  [id number]   MEDICAL RECORD NO.:  [id number]                   PATIENT TYPE:  OIB   LOCATION:                                       FACILITY:  [facility]   PHYSICIAN:  Anselm Pancoast. Zachery Dakins, M.D.          DATE OF BIRTH:  11-09-1955   DATE OF PROCEDURE:  11/25/2002  DATE OF DISCHARGE:                                 OPERATIVE REPORT   PREOPERATIVE DIAGNOSIS:  Carcinoma of the left breast.   POSTOPERATIVE DIAGNOSIS:  Carcinoma of the left breast.   OPERATION PERFORMED:  Left lumpectomy and sentinel node biopsy.   SURGEON:  Anselm Pancoast. Zachery Dakins, M.D.   ANESTHESIA:  General.   INDICATIONS FOR PROCEDURE:  The patient is a 55 year old Caucasian female  who I first saw approximately five years ago for questionable abnormality  and she had a core biopsy done at that time in [facility].  This was  negative and she has been followed.  Dr. Randell Patient is her gynecologist, Dr.  Alwyn Ren is her regular physician and she has had serial mammograms.  Recently  she noticed a little thickening in the left breast, had  a repeat mammogram  and there has definitely been a change from the previous year.  She was seen  by Dr. 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  The patient returned to see me and on examination, you could definitely feel  a little thickening in the subareolar area.  This was closed to where the  previous biopsy had been performed and I recommended that we do a lumpectomy  and sentinel node biopsy and she was for this planned procedure.  The  patient's health is otherwise normal  and she is kind of perimenopausal.  Her periods are still regular, a little shorter in nature and definitely  beginning to have early changes of menopause.  The patient understands the  planned procedure, was injected with radioactive dye prior to coming to the  operating room suite.  I elected after doing the [facility] counter  probe since  she has a very hot node, not to inject the methylene blue dye, since I found  these right under the subareolar area.  The blue sort of makes it very hard  doing the lumpectomy with the discoloration.  Therefore, first, after  prepping the axilla and breast with Betadine solution and draping in a  sterile manner, I marked a little skin with the Clarisa Kindred counter location and  then made a little small incision about an inch transversely right at the  lateral edge of the pectoral muscle and then dissected down identifying a  node that had a count of about 2300.  A background count after the node was  excised went down to about 30 and I separated the node and sent it for Touch  Preps.  Dr. Clelia Croft looked at it and said  there were some histiocytes but she  thought that it was benign or negative. Next, I made an areolar incision  just superior to the areolar edge and then developed the skin off over the  midline lump and then divided the nipple from the underlying transected  breast core and then working circumferentially hopefully leaving about 1 cm  of normal breast tissue surrounding this, excised this area.  All told the  area excised was about the size of a small egg.  The bleeders were  controlled with cautery and sutured with 4-0 Vicryl and then the breast  tissue was kind of approximated deep layer with interrupted 4-0 Vicryl and  then the skin was closed with 4-0 nylon.  I closed the axillary skin with a  4-0 nylon also. The specimen was sent for permanent.  I did one little area  of medial tissue that sent separate and I did place a single clip  superiorly, double clip medially for orientation purposes.  The actual  biopsy marked with a single stitch at the nipple area, two stitches medially  and a single short stitch superior for orientation of the specimen.  It was  sent for permanent margins.  The patient awake.  Sterile  occlusive dressing applied.  I think she is going to be  released after a  short stay in the recovery room if she is not having significant nausea or  problems.   DESCRIPTION OF PROCEDURE:                                                Anselm Pancoast. Zachery Dakins, M.D.    WJW/MEDQ  D:  11/25/2002  T:  11/25/2002  Job:  [id number]   cc:   Almedia Balls. Fore, M.D.  [phone] [address] [place]  [place] [address]  Fax: [phone]   Titus Dubin. Alwyn Ren, M.D. [facility]

## 2010-12-24 NOTE — Op Note (Signed)
NAME:  Gabriela Carrillo, Gabriela Carrillo                           ACCOUNT NO.:  [id number]   MEDICAL RECORD NO.:  [id number]                   PATIENT TYPE:  AMB   LOCATION:  DSC                                  FACILITY:  [facility]   PHYSICIAN:  Anselm Pancoast. Zachery Dakins, M.D.          DATE OF BIRTH:  09-09-1955   DATE OF PROCEDURE:  01/08/2003  DATE OF DISCHARGE:                                 OPERATIVE REPORT   PREOPERATIVE DIAGNOSIS:  Carcinoma of the left breast.   POSTOPERATIVE DIAGNOSIS:  Carcinoma of the left breast.   OPERATION PERFORMED:  Placement of Port-A-Cath in the right subclavian.   SURGEON:  Anselm Pancoast. Zachery Dakins, M.D.   ANESTHESIA:  Local with heavy sedation.   INDICATIONS FOR PROCEDURE:  Gabriela Carrillo is a 55 year old Caucasian female  who recently had a lumpectomy.  She is seeing a medical oncologist and is  getting ready for chemotherapy to be followed by radiation therapy.  She  needs an IV access and I was asked to place a Port-A-Cath for this purpose.   DESCRIPTION OF PROCEDURE:  The patient preoperatively was given a gram of  Kefzol and she began some IV fluids, taken to the OR suite, a roll placed  under the back.  She is quite thin.  The right subclavian area and the neck  was prepped with Betadine solution and draped in a sterile manner. I placed  her in a slight Trendelenburg position, anesthetized the subclavicular area  and then with her in a more steep Trendelenburg position after draping, on  the first stick we were able to enter the subclavian vein with the guidewire  slipped in nicely.  The little pocket was created and the skin was  anesthetized with the Xylocaine and then after the pocket was created, there  were a couple of little bleeders that were sutured with 3-0 chromic.  I then  placed her back in Trendelenburg and the dilator slipped out of the  guidewire.  The catheter did not want to thread quite as easily and it was  necessary to kind of replace  the  guidewire in the little Silastic catheter  so it would actually go around the little bend and then we positioned her  sort of flat and adjusted it so that the tip of the catheter was in the  atrium-superior vena cava junction area.  Next, I tunneled the little  catheter, hooked it up to the heparinized filled Port-A-Cath, __________,  detachable, and then cut it off attached it and slipped a little locking  device into place.  The three 2-0 Prolene sutures were tied.  The catheter  was lying comfortable.  We had checked the position with the C-arm and it  flushes and aspirates quite easily.  We then closed a little subcutaneous  around the catheter with 3-0 chromic, then 4-0 Dexon subcuticular and then  Benzoin and Steri-Strips on the skin.  I then used a bent Huber needle to  inject and aspirate.  Then it was filled with about 3mL of 100 units of  heparin per mL and left the needle in as she will receive her first course  of chemotherapy  in the morning.  The patient tolerated the procedure nicely.  We will get a  chest x-ray in the recovery room and she should be able to be released in a  short time.  I will see her back in the office for a wound check in  approximately two weeks.                                                Anselm Pancoast. Zachery Dakins, M.D.    WJW/MEDQ  D:  01/08/2003  T:  01/08/2003  Job:  [id number]   cc:   Samul Dada, M.D.  [address] [facility]  [place]  [place] [address]  Fax: [phone]

## 2010-12-24 NOTE — Op Note (Signed)
NAME:  [name], [name]                          ACCOUNT NO.:  [id number]   MEDICAL RECORD NO.:  [id number]                   PATIENT TYPE:  OIB   LOCATION:  2852                                 FACILITY:  [facility]   PHYSICIAN:  Anselm Pancoast. Zachery Dakins, M.D.          DATE OF BIRTH:  October 27, 1955   DATE OF PROCEDURE:  05/09/2003  DATE OF DISCHARGE:  05/09/2003                                 OPERATIVE REPORT   PREOPERATIVE DIAGNOSES:  1. Non-used Port-A-Cath.  2. History of cancer of the left breast.   OPERATION:  Removal of Port-A-Cath.   Local with sedation.   SURGEON:  Anselm Pancoast. Zachery Dakins, M.D.   HISTORY:  Gabriela Carrillo is a 55 year old female who had a Port-A-Cath  inserted for chemotherapy administration for carcinoma of the left breast.  She has completed her chemotherapy and desires that the Port-A-Cath be  removed.  There is no evidence of any infection.  She desires sedation for  the procedure and has thus been scheduled in the OR instead of the minor  surgery room.  She has a history of mitral valve prolapse and was given 1 g  of Kefzol immediately prior to the procedure.  IV had been started.  She was  sedated, breathing spontaneously, and then the area around the Port-A-Cath-  right subclavian area was prepped with Betadine soap and solution and draped  in a sterile manner.  I anesthetized the original incision where it had been  inserted with approximately 7 or 8 mL of 1% plain Xylocaine and she is thin,  and the Port-A-Cath was easily accessible and I made a little vertical  incision, opened up the old incision, dissected down, and then basically  could easily see the little Prolene suture laterally, and this was clipped.  That gave me a little bit of mobility of the Port-A-Cath and then I placed a  figure-of-eight around the sheath around the actual Silastic tube and then  with her in a head-down Trendelenburg position, removed the Port-A-Cath and  then tied this little  figure-of-eight 3-0 chromic that I had placed and  placed a second stitch, and there was no evidence of any bleeding when the  Port-A-Cath was removed.  Then I could kind of pull the Port-A-Cath and see  the second little superior Prolene suture.  This was clipped, the stitch  removed, and then you could see the most inferior stitch.  It was likewise  divided, removed, and the Port-A-Cath slipped out easily.  The little  subcutaneous incision was closed with three simple sutures of 3-0 chromic.  I used 4-0 Vicryl subcuticular and then Benzoin and Steri-Strips on the skin  incision.  She tolerated the procedure nicely and will be released after a  short stay in the recovery room.  I instructed her not to get the area wet  for about three days and not to get totally submerged for approximately a  week.  I will see her in a wound check for a follow-up in approximately one  week.                                               Anselm Pancoast. Zachery Dakins, M.D.    WJW/MEDQ  D:  05/09/2003  T:  05/09/2003  Job:  [id number]

## 2011-01-31 MED ORDER — MELOXICAM 15 MG PO TABS
15 MG | ORAL_TABLET | ORAL | Status: DC
Start: 2011-01-31 — End: 2012-03-23

## 2011-02-14 MED ORDER — GABAPENTIN 300 MG PO CAPS
300 MG | ORAL_CAPSULE | Freq: Three times a day (TID) | ORAL | Status: DC
Start: 2011-02-14 — End: 2011-03-09

## 2011-02-15 LAB — COMPREHENSIVE METABOLIC PANEL
ALT: 15 U/L (ref 10–40)
AST: 27 U/L (ref 15–37)
Albumin/Globulin Ratio: 1.8 (ref 1.1–2.2)
Albumin: 4.8 gm/dl (ref 3.4–5.0)
Alkaline Phosphatase: 71 U/L (ref 45–129)
BUN: 18 mg/dl (ref 7–18)
CO2: 26 mEq/L (ref 21–32)
Calcium: 9.9 mg/dl (ref 8.3–10.6)
Chloride: 104 mEq/L (ref 99–110)
Creatinine: 0.8 mg/dl (ref 0.6–1.1)
GFR Est, African/Amer: >60
GFR, Estimated: >60 (ref >60)
Glucose: 82 mg/dl (ref 70–99)
Potassium: 3.9 mEq/L (ref 3.5–5.1)
Sodium: 141 mEq/L (ref 136–145)
Total Bilirubin: 0.5 mg/dl (ref 0.0–1.0)
Total Protein: 7.5 gm/dl (ref 6.4–8.2)

## 2011-02-15 LAB — CBC WITH AUTO DIFFERENTIAL
Basophils %: 0.8 % (ref 0.0–2.0)
Basophils Absolute: 0.1 10*3 (ref 0.0–0.2)
Eosinophils %: 1.3 % (ref 0.0–5.0)
Eosinophils Absolute: 0.1 10*3 (ref 0.0–0.6)
Granulocyte Absolute Count: 3.4 10*3 (ref 1.7–7.7)
Hematocrit: 44.5 % (ref 36.0–48.0)
Hemoglobin: 15.1 gm/dl (ref 12.0–16.0)
Lymphocytes %: 45.6 % — ABNORMAL HIGH (ref 25.0–40.0)
Lymphocytes Absolute: 3.3 10*3 (ref 1.0–5.1)
MCH: 30.9 pg (ref 26–34)
MCHC: 33.8 gm/dl (ref 31–36)
MCV: 91.3 fl (ref 80–100)
MPV: 9.4 fl (ref 5.0–10.5)
Monocytes %: 4.3 % (ref 0.0–12.0)
Monocytes Absolute: 0.3 10*3 (ref 0.0–1.3)
Platelets: 273 10*3 (ref 135–450)
RBC: 4.87 10*6 (ref 4.0–5.2)
RDW: 13.9 % (ref 11.5–14.5)
Segs Relative: 48 % (ref 42.0–63.0)
WBC: 7.2 10*3 (ref 4.0–11.0)

## 2011-02-15 NOTE — Progress Notes (Signed)
S:  The patient presents with 2 issues:  (1) Pruritic rash primarily on the  arms; in fact, only on the arms, and (2) irritated, inflamed skin tag,  necklace line, right side, which has gotten worse lately and is really tender  whenever she even puts on a blouse.  Denies any bleeding, etc., in that site.   With regard to the rash, she says she has had this itching problem in her  arms primarily for 10-15 years.  She describes it as a burning, itching,  bite-like sensation that comes and goes.  These episodes are more frequent  over the last year, occurring on a weekly basis during which time the itching  persists and this burning/biting for about 2-3 days.  She rubs the area,  irritates it, and it gets better.  This goes back, she says, 10-15 years.   She has seen at least 2 dermatologists, Dr. Jenita Seashore in 2008, and Dr.  Loma Boston.  Dr. Loma Boston prescribed sunshine and Dr. Jenita Seashore said it was a  neurodermatitis, I believe, from what she is describing.  It has not  responded to treatment.  The patient has not noticed any relationship to  foods; time of year; types of clothing; soaps, either personal or laundry  soaps; fabric softeners.  Does admit to some stress and strain but does not  think that is anything different and no one else in the family, and that is  her husband and I believe someone who is living with her, a child, has this  as well.  Did not enquire about pets.  She describes this primarily in the  arms; really does not have it anywhere else.  The skin tag, as already  stated.     O:  On examination, the patient's skin on her hands and arms looks fine  except for multiple superficial bruising where she has rubbed and irritated  it.  There are no really deep excoriations noted.  The legs, the abdomen,  chest do not have any of that.  On the right side of her anterior neck there  is an inflamed and tender skin tag which is not bleeding and there are no  pigmentary changes there, either.     A:    1.   Neurodermatitis, etiology really unclear.        2.  Inflamed skin tag.     P:    1.  Really wonder where there is a bit of a neuropathy here.  Did  do some blood work.  Will call regarding followup and those values.   Prescribed GABAPENTIN.  Next step may be to try [facility], and I think that is  worth doing.          2.  With regard to the inflamed skin tag, risks, benefits, consent.     PROCEDURE:  Sterile scrub to the area.  Scissors tangential excision.   Pathology not sent.  DRYSOL for hemostasis.  Dressing and dressing  instructions applied.              __________________________________        Lauris Chroman, MD     [phone]  DD: 02/15/2011 02:14  DT: 02/15/2011 10:06  SSI File#: [id number]  Job #: [id number]  CC:

## 2011-02-15 NOTE — Progress Notes (Signed)
Complete note dictated.

## 2011-02-15 NOTE — Patient Instructions (Signed)
F/u depending on test results   No path on skin:simple inflamed skin tag

## 2011-02-17 ENCOUNTER — Encounter

## 2011-03-07 NOTE — Telephone Encounter (Signed)
I left a message for the patient to return my call.

## 2011-03-07 NOTE — Telephone Encounter (Signed)
I am confused. Last visit,I gave her gabapentin 300 mgm tid;I thought about lyrica,but I did not prescribe it. Perhaps I gave her samples. Please see what we did;also,call the pharmacy and see what the rx's actually were since pt sometimes gets confused.

## 2011-03-07 NOTE — Telephone Encounter (Signed)
Patient says the Lyrica has helped some, but not much. She said you had told her to call & you would probably switch some things around.

## 2011-03-08 NOTE — Telephone Encounter (Signed)
Patient returned called she is taking Gabapentin 300 mg tid.  I called pharmacy and that is the only script that she has.  She filled it 02/14/2011

## 2011-03-08 NOTE — Telephone Encounter (Signed)
Increase gabapentin to 600 mgm tid,warn re drowiness;enough for 30 days. Let me know if that does the trick.

## 2011-03-09 MED ORDER — GABAPENTIN 600 MG PO TABS
600 MG | ORAL_TABLET | Freq: Three times a day (TID) | ORAL | Status: DC
Start: 2011-03-09 — End: 2011-03-29

## 2011-03-09 NOTE — Telephone Encounter (Signed)
Patient informed and medication sent

## 2011-03-15 ENCOUNTER — Encounter: Payer: Self-pay | Admitting: Internal Medicine

## 2011-03-28 NOTE — Telephone Encounter (Signed)
If she is talking about the atarax 50,she can take a second tablet 6 hrs later.

## 2011-03-28 NOTE — Telephone Encounter (Signed)
She is talking about the gabapentin 600 mg

## 2011-03-28 NOTE — Telephone Encounter (Signed)
Have her take gabapentin 600: 1 1/2 tablet at hs only;#30

## 2011-03-28 NOTE — Telephone Encounter (Signed)
Patient states that you put her on medication for her itching & that it has helped some, but she feels she needs it to be a little stronger.  It wears off too soon & she wakes up itching in the middle of the night.

## 2011-03-29 MED ORDER — GABAPENTIN 600 MG PO TABS
600 MG | ORAL_TABLET | ORAL | Status: DC
Start: 2011-03-29 — End: 2011-06-15

## 2011-03-29 NOTE — Telephone Encounter (Signed)
Patient informed

## 2011-04-25 MED ORDER — MONTELUKAST SODIUM 10 MG PO TABS
10 MG | ORAL_TABLET | ORAL | Status: DC
Start: 2011-04-25 — End: 2011-06-15

## 2011-05-09 MED ORDER — CITALOPRAM HYDROBROMIDE 40 MG PO TABS
40 MG | ORAL_TABLET | ORAL | Status: DC
Start: 2011-05-09 — End: 2011-06-15

## 2011-05-24 ENCOUNTER — Encounter: Payer: Self-pay | Admitting: Medical Oncology

## 2011-06-13 ENCOUNTER — Other Ambulatory Visit: Payer: Self-pay | Admitting: Internal Medicine

## 2011-06-15 ENCOUNTER — Telehealth: Payer: Self-pay | Admitting: Oncology

## 2011-06-15 MED ORDER — HYDROCODONE-ACETAMINOPHEN 5-500 MG PO TABS
5-500 MG | ORAL_TABLET | Freq: Four times a day (QID) | ORAL | Status: DC | PRN
Start: 2011-06-15 — End: 2011-12-13

## 2011-06-15 MED ORDER — HYDROXYZINE HCL 50 MG PO TABS
50 MG | ORAL_TABLET | Freq: Three times a day (TID) | ORAL | Status: DC | PRN
Start: 2011-06-15 — End: 2011-12-12

## 2011-06-15 MED ORDER — TRIAMCINOLONE ACETONIDE 0.1 % EX CREA
0.1 % | CUTANEOUS | Status: DC
Start: 2011-06-15 — End: 2015-03-02

## 2011-06-15 MED ORDER — ZOLPIDEM TARTRATE 10 MG PO TABS
10 MG | ORAL_TABLET | Freq: Every evening | ORAL | Status: DC | PRN
Start: 2011-06-15 — End: 2011-12-13

## 2011-06-15 MED ORDER — MONTELUKAST SODIUM 10 MG PO TABS
10 MG | ORAL_TABLET | Freq: Every evening | ORAL | Status: DC
Start: 2011-06-15 — End: 2011-12-28

## 2011-06-15 MED ORDER — CITALOPRAM HYDROBROMIDE 40 MG PO TABS
40 MG | ORAL_TABLET | Freq: Every day | ORAL | Status: DC
Start: 2011-06-15 — End: 2012-01-11

## 2011-06-15 MED ORDER — GABAPENTIN 600 MG PO TABS
600 MG | ORAL_TABLET | ORAL | Status: DC
Start: 2011-06-15 — End: 2011-12-01

## 2011-06-15 NOTE — Progress Notes (Signed)
Subjective:      Patient ID: Cassandra Copeland is a 55 y.o. female.    HPI  Chief Complaint   Patient presents with   . Gastrophageal Reflux     -No recent flare in heartburn;no wt loss or progressive sx    . Other     Fibromyalgia-controlled;   . Cough     n-p;   . Ear Fullness     bil.;onset one week;hx of allergies=singular,zyrtec;   . Other     itching is better with gaba;no drowsiness;        Review of Systems   Musculoskeletal:        Stable   Skin:        See cc   Psychiatric/Behavioral: Negative.        Objective:   Physical Exam   Vitals reviewed.  Constitutional: She appears well-developed and well-nourished.   HENT:   Head: Normocephalic.   Right Ear: External ear normal. A middle ear effusion is present.   Left Ear: A middle ear effusion is present.   Nose: Mucosal edema present.   Mouth/Throat: Oropharynx is clear and moist and mucous membranes are normal.   Cardiovascular: Normal rate, regular rhythm and normal heart sounds.    Pulmonary/Chest: Effort normal. She has no wheezes. She has no rales.       Assessment:      Cassandra Copeland was seen today for gastrophageal reflux, other, cough, ear fullness and other.    Diagnoses and associated orders for this visit:    Neurodermatitis  - hydrOXYzine (ATARAX) 50 MG tablet; Take 1 tablet by mouth every 8 hours as needed for Itching.  - triamcinolone (ARISTOCORT A) 0.1 % cream; Apply as needed for itching.    Fibromyalgia  - HYDROcodone-acetaminophen (VICODIN) 5-500 MG per tablet; Take 1 tablet by mouth every 6 hours as needed.    Allergic rhinitis    Other Orders  - citalopram (CELEXA) 40 MG tablet; Take 1 tablet by mouth daily.  - zolpidem (AMBIEN) 10 MG tablet; Take 1 tablet by mouth nightly as needed.  - montelukast (SINGULAIR) 10 MG tablet; Take 1 tablet by mouth nightly.  - gabapentin (NEURONTIN) 600 MG tablet; Take one tablet twice daily and one and one half tablets at bedtime             Plan:      No Follow-up on file.   F/u 1 yr

## 2011-06-15 NOTE — Telephone Encounter (Signed)
Per DM ok for 11/9 appt to be r/s'd for jan. Pt can't make appt due to husband dx w/ca + 1st tx is 11/9. S/w pt today re new d/t for 08/25/2011 @ 10 am. Time per pt.

## 2011-06-17 ENCOUNTER — Ambulatory Visit: Payer: No Typology Code available for payment source | Admitting: Oncology

## 2011-06-17 ENCOUNTER — Other Ambulatory Visit: Payer: No Typology Code available for payment source | Admitting: Lab

## 2011-06-21 ENCOUNTER — Telehealth

## 2011-06-21 MED ORDER — PROMETHAZINE-CODEINE 6.25-10 MG/5ML PO SYRP
Freq: Four times a day (QID) | ORAL | Status: AC | PRN
Start: 2011-06-21 — End: 2011-06-28

## 2011-06-21 NOTE — Telephone Encounter (Signed)
Patient wants to know if you would call in something for her cough.  She says she has been using the mucinex like you suggested & it has helped some, but would like something for her cough.

## 2011-06-21 NOTE — Telephone Encounter (Signed)
rx sent for phen with cod-fax??

## 2011-06-21 NOTE — Telephone Encounter (Signed)
Medication called in and patient informed

## 2011-08-23 ENCOUNTER — Encounter: Payer: Self-pay | Admitting: Medical Oncology

## 2011-08-25 ENCOUNTER — Telehealth: Payer: Self-pay | Admitting: Oncology

## 2011-08-25 ENCOUNTER — Other Ambulatory Visit (HOSPITAL_BASED_OUTPATIENT_CLINIC_OR_DEPARTMENT_OTHER): Payer: BC Managed Care – PPO | Admitting: Lab

## 2011-08-25 ENCOUNTER — Ambulatory Visit (HOSPITAL_COMMUNITY)
Admission: RE | Admit: 2011-08-25 | Discharge: 2011-08-25 | Disposition: A | Discharge status: Home / Self Care | Payer: BC Managed Care – PPO | Source: Ambulatory Visit | Attending: Oncology | Admitting: Oncology

## 2011-08-25 ENCOUNTER — Encounter: Payer: Self-pay | Admitting: Oncology

## 2011-08-25 ENCOUNTER — Ambulatory Visit (HOSPITAL_BASED_OUTPATIENT_CLINIC_OR_DEPARTMENT_OTHER): Payer: BC Managed Care – PPO | Admitting: Oncology

## 2011-08-25 VITALS — BP 141/88 | HR 80 | Temp 98.9°F | Ht 64.0 in | Wt 127.8 lb

## 2011-08-25 DIAGNOSIS — C50919 Malignant neoplasm of unspecified site of unspecified female breast: Secondary | ICD-10-CM | POA: Insufficient documentation

## 2011-08-25 DIAGNOSIS — Z79899 Other long term (current) drug therapy: Secondary | ICD-10-CM

## 2011-08-25 DIAGNOSIS — Z853 Personal history of malignant neoplasm of breast: Secondary | ICD-10-CM

## 2011-08-25 DIAGNOSIS — Z170 Estrogen receptor positive status [ER+]: Secondary | ICD-10-CM

## 2011-08-25 DIAGNOSIS — C50119 Malignant neoplasm of central portion of unspecified female breast: Secondary | ICD-10-CM

## 2011-08-25 DIAGNOSIS — C8589 Other specified types of non-Hodgkin lymphoma, extranodal and solid organ sites: Secondary | ICD-10-CM

## 2011-08-25 LAB — COMPREHENSIVE METABOLIC PANEL
AST: 25 U/L (ref 0–37)
Albumin: 4.4 g/dL (ref 3.5–5.2)
Alkaline Phosphatase: 64 U/L (ref 39–117)
Glucose, Bld: 85 mg/dL (ref 70–99)
Potassium: 3.8 mEq/L (ref 3.5–5.3)
Sodium: 142 mEq/L (ref 135–145)
Total Bilirubin: 0.4 mg/dL (ref 0.3–1.2)
Total Protein: 6.8 g/dL (ref 6.0–8.3)

## 2011-08-25 LAB — CBC WITH DIFFERENTIAL/PLATELET
BASO%: 0.7 % (ref 0.0–2.0)
EOS%: 0.6 % (ref 0.0–7.0)
LYMPH%: 27.5 % (ref 14.0–49.7)
MCH: 31.8 pg (ref 25.1–34.0)
MCHC: 35.3 g/dL (ref 31.5–36.0)
MCV: 90 fL (ref 79.5–101.0)
MONO%: 6.3 % (ref 0.0–14.0)
NEUT#: 4.6 10*3/uL (ref 1.5–6.5)
Platelets: 218 10*3/uL (ref 145–400)
RBC: 4.93 10*6/uL (ref 3.70–5.45)
RDW: 11.9 % (ref 11.2–14.5)

## 2011-08-25 NOTE — Progress Notes (Signed)
This office note has been dictated.  #[id number]

## 2011-08-25 NOTE — Progress Notes (Signed)
Quick Note:  Please notify patient and call/fax these results to patient's doctors. ______ 

## 2011-08-25 NOTE — Progress Notes (Signed)
CC:   Gabriela Carrillo. Alwyn Ren, MD,FACP,FCCP Leatha Gilding. Mezer, M.D.  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  Gabriela Carrillo is here today with her husband, Gabriela Carrillo, has been undergoing chemotherapy apparently for non-Hodgkin lymphoma under the care of Dr. Christin Bach.  Gabriela Carrillo was last seen here on 12/16/2010. Her condition remains stable and she remains disease-free.  She sees Dr. Teodora Medici.  She does retain her uterus.  Denies any vaginal bleeding. She had a bone density scan on 10/04/2010 that she thinks was negative, although on our problem list we had carried osteopenia and she had been at 1 time on vitamin D and calcium.  The patient's last chest x-ray was on November 2011.  Her last mammogram was on 12/06/2010.  As stated, she is without complaints today and feels generally well.  She has been successful in losing some weight.  PROBLEM LIST: 1. Invasive ductal carcinoma of the left breast, stage IIB (T2 N1),     grade 2/3, a 2.2-cm primary, 1 sentinel lymph node positive,     lymphovascular invasion present, estrogen receptor 73%,     progesterone receptor 96%, proliferative fraction 17%, DNA index     0.98, diploid, and HER-2/neu negative.  Diagnosis was established     in April 2004.  She had lumpectomy and sentinel lymph node biopsy     on 11/25/2002, followed by 6 cycles of adjuvant chemotherapy with     Cytoxan, Adriamycin, and 5-fluorouracil from 12/19/2002 through     04/03/2003.  Radiation treatments from September 2004 through     June 10, 2003 and then adjuvant tamoxifen from mid November 2004     through November 2009.  The patient had a Port-A-Cath from June     2004 through October 2004.  She remains free of recurrent disease. 2. Irritable bowel syndrome. 3. History of depression and anxiety. 4. History of mitral valve prolapse.  MEDICATIONS: 1. Librax  1 a day as needed. 2. Prozac 20 mg daily. 3. The patient had been on calcium carbonate and vitamin D, but has     not been taking that medicine recently.  I suggested that she check     with Dr. Chevis Pretty and Dr. Alwyn Ren on whether she needs to be on this.  PHYSICAL EXAMINATION:  General:  Gabriela Carrillo looks well.  She is now 56 years old.  Vital Signs:  Weight today is 127 pounds 12.8 ounces as compared with 138 pounds 12.8 ounces on 12/16/2010.  Height 5 feet 4 inches, body surface area 1.62 sq m.  Blood pressure 141/88.  Other vital signs are normal.  HEENT:  There is no scleral icterus.  Mouth and pharynx are benign.  No peripheral adenopathy palpable.  Heart and Lungs:  Normal. Breasts:  Right breast is benign.  Left breast scar is at the 12 o'clock position with a slightly firm nodule in the medial aspect of this scar. Breasts are firm.  Left breast smaller than the right breast.  No suspicious findings.  Abdomen:  Benign with no organomegaly or masses palpable.  Extremities:  No peripheral edema or clubbing.  No obvious lymphedema of the left arm.  Gabriela Carrillo has a fairly new tattoo on her right forearm.  Neurologic Exam:  Grossly normal.  LABORATORY DATA:  Today white count 7.0, ANC 4.6, hemoglobin 15.7, hematocrit 44.3, platelets 218,000.  Chemistries today are pending.  Chemistries from 12/16/2010 were entirely normal.  IMAGING STUDIES: 1. Digital screening mammograms were carried out on 12/06/2010 and     were negative. 2. Chest x-ray, 2 view on 06/18/2010 showed no significant     abnormalities.  The patient will have a chest x-ray today.  IMPRESSION AND PLAN:  Gabriela Carrillo is now out approaching 9 years from the time of diagnosis.  She continues to do well without any suggestion of recurrent disease.  She will have a chest x-ray today.  We will plan to see her again in 6 months, at which time we will check CBC and chemistries.  She will be due for screening mammograms in April of this year.   She continues to see Dr. Chevis Pretty, as she does retain her uterus.    ______________________________ Samul Dada, M.D. DSM/MEDQ  D:  08/25/2011  T:  08/25/2011  Job:  [id number]

## 2011-08-25 NOTE — Telephone Encounter (Signed)
Pt sent to [facility] and given appt schedule for July.

## 2011-08-26 ENCOUNTER — Telehealth: Payer: Self-pay | Admitting: Medical Oncology

## 2011-08-26 NOTE — Telephone Encounter (Signed)
I called pt with her chest x-ray results from 08/25/11 per Dr. Mamie Levers request

## 2011-10-13 ENCOUNTER — Other Ambulatory Visit: Payer: Self-pay | Admitting: Internal Medicine

## 2011-11-23 ENCOUNTER — Other Ambulatory Visit: Payer: Self-pay | Admitting: Internal Medicine

## 2011-11-23 NOTE — Telephone Encounter (Signed)
Patient needs to schedule a CPX  

## 2011-11-30 ENCOUNTER — Telehealth: Payer: Self-pay | Admitting: Internal Medicine

## 2011-11-30 ENCOUNTER — Encounter: Payer: Self-pay | Admitting: Internal Medicine

## 2011-11-30 ENCOUNTER — Ambulatory Visit (INDEPENDENT_AMBULATORY_CARE_PROVIDER_SITE_OTHER): Payer: BC Managed Care – PPO | Admitting: Internal Medicine

## 2011-11-30 VITALS — BP 124/86 | HR 76 | Temp 98.4°F | Wt 129.6 lb

## 2011-11-30 DIAGNOSIS — R05 Cough: Secondary | ICD-10-CM

## 2011-11-30 DIAGNOSIS — R059 Cough, unspecified: Secondary | ICD-10-CM

## 2011-11-30 DIAGNOSIS — J019 Acute sinusitis, unspecified: Secondary | ICD-10-CM

## 2011-11-30 MED ORDER — HYDROCODONE-HOMATROPINE 5-1.5 MG/5ML PO SYRP: 5.0000 mL | ORAL_SOLUTION | Freq: Four times a day (QID) | ORAL | Status: AC | PRN

## 2011-11-30 MED ORDER — AMOXICILLIN-POT CLAVULANATE 875-125 MG PO TABS: 1.0000 | ORAL_TABLET | Freq: Two times a day (BID) | ORAL | Status: AC

## 2011-11-30 NOTE — Telephone Encounter (Signed)
Caller: Gabriela Carrillo/Patient; PCP: Marga Melnick; CB#: [phone]; Call regarding Cold Symptoms; Sinus congestion, occ cough  x 4 wks, green sinus drng, mild ST. Afebrile. Appt sched for 1600 today with Dr. Alwyn Ren. URI Protocol.

## 2011-11-30 NOTE — Patient Instructions (Signed)
Plain Mucinex for thick secretions ;force NON dairy fluids . Use a Neti pot daily as needed for sinus congestion; going from open side to congested side . Nasal cleansing in the shower as discussed. Make sure that all residual soap is removed to prevent irritation.  

## 2011-11-30 NOTE — Progress Notes (Signed)
  Subjective:    Patient ID: Gabriela Carrillo, female    DOB: 18-Apr-1956, 56 y.o.   MRN: [id number]  HPI her symptoms began approximately 2 weeks ago as rhinitis with associated frontal headache and some chills. This progressed to a cough which has been essentially nonproductive. The symptoms initially improved but she's had residual thick green discharge from her nares.  At this time she denies frontal headache, facial pain, or dental pain. She has some sore throat which she relates to the cough.  She's been taking DayQuil and NyQuil with some response.    Review of Systems initially she had sneezing without itchy eyes this but this resolved. She did not have fevers sweats with the  chills     Objective:   Physical Exam General appearance:good health ;well nourished; no acute distress or increased work of breathing is present.  No  lymphadenopathy about the head, neck, or axilla noted.   Eyes: No conjunctival inflammation or lid edema is present. .  Ears:  Cerumen is present with complete blockage on the right and partial left  Nose:  External nasal examination shows no deformity or inflammation. Nasal mucosa are pink and moist without lesions or exudates. No septal dislocation or deviation.No obstruction to airflow.   Oral exam: Dental hygiene is good; lips and gums are healthy appearing.There is no oropharyngeal erythema or exudate noted.      Heart:  Normal rate and regular rhythm. S1 and S2 normal without gallop, murmur, click, rub or other extra sounds.   Lungs:Chest clear to auscultation; no wheezes, rhonchi,rales ,or rubs present.No increased work of breathing. She has intermittent harsh, racking  cough  Extremities:  No cyanosis, edema, or clubbing  noted    Skin: Warm & dry .          Assessment & Plan:  #1 rhinosinusitis with possible  Bronchitic component  Plan: Nasal hygiene interventions discussed. See prescription medications

## 2011-12-01 MED ORDER — GABAPENTIN 600 MG PO TABS
600 MG | ORAL_TABLET | ORAL | Status: DC
Start: 2011-12-01 — End: 2012-02-17

## 2011-12-01 NOTE — Telephone Encounter (Signed)
Last ov 11/7  Next ov none

## 2011-12-12 MED ORDER — HYDROXYZINE HCL 50 MG PO TABS
50 MG | ORAL_TABLET | ORAL | Status: DC
Start: 2011-12-12 — End: 2012-03-23

## 2011-12-12 NOTE — Telephone Encounter (Signed)
Last apt 11/7  Next apt none

## 2011-12-13 ENCOUNTER — Encounter

## 2011-12-13 MED ORDER — HYDROCODONE-ACETAMINOPHEN 5-500 MG PO TABS
5-500 MG | ORAL_TABLET | Freq: Four times a day (QID) | ORAL | Status: DC | PRN
Start: 2011-12-13 — End: 2012-03-23

## 2011-12-13 MED ORDER — ZOLPIDEM TARTRATE 10 MG PO TABS
10 MG | ORAL_TABLET | Freq: Every evening | ORAL | Status: DC | PRN
Start: 2011-12-13 — End: 2012-03-23

## 2011-12-13 NOTE — Telephone Encounter (Signed)
No scheduled appointment.  Last office visit was November

## 2011-12-28 MED ORDER — MONTELUKAST SODIUM 10 MG PO TABS
10 MG | ORAL_TABLET | ORAL | Status: DC
Start: 2011-12-28 — End: 2012-11-12

## 2011-12-28 NOTE — Telephone Encounter (Signed)
Medication: montelukast (SINGULAIR) 10 MG tablet   Last ov: 06/15/11  Pending: None

## 2012-01-11 MED ORDER — CITALOPRAM HYDROBROMIDE 40 MG PO TABS
40 MG | ORAL_TABLET | ORAL | Status: DC
Start: 2012-01-11 — End: 2012-03-18

## 2012-01-11 NOTE — Telephone Encounter (Signed)
Last routine office visit  06/15/2011

## 2012-01-23 ENCOUNTER — Other Ambulatory Visit: Payer: Self-pay | Admitting: Oncology

## 2012-01-23 DIAGNOSIS — Z1231 Encounter for screening mammogram for malignant neoplasm of breast: Secondary | ICD-10-CM

## 2012-01-26 ENCOUNTER — Ambulatory Visit
Admission: RE | Admit: 2012-01-26 | Discharge: 2012-01-26 | Disposition: A | Discharge status: Home / Self Care | Payer: BC Managed Care – PPO | Source: Ambulatory Visit | Attending: Oncology | Admitting: Oncology

## 2012-01-26 DIAGNOSIS — Z1231 Encounter for screening mammogram for malignant neoplasm of breast: Secondary | ICD-10-CM

## 2012-02-02 ENCOUNTER — Telehealth: Payer: Self-pay | Admitting: Oncology

## 2012-02-02 NOTE — Telephone Encounter (Signed)
l/m that 7/19 appt had been r.s to 7/19

## 2012-02-10 ENCOUNTER — Other Ambulatory Visit: Payer: Self-pay | Admitting: Internal Medicine

## 2012-02-17 MED ORDER — GABAPENTIN 600 MG PO TABS
600 MG | ORAL_TABLET | ORAL | Status: DC
Start: 2012-02-17 — End: 2012-08-04

## 2012-02-17 NOTE — Telephone Encounter (Signed)
Last seen 06/15/11, told to fu in 1 yr.

## 2012-02-21 ENCOUNTER — Other Ambulatory Visit: Payer: Self-pay | Admitting: Internal Medicine

## 2012-02-24 ENCOUNTER — Ambulatory Visit: Payer: BC Managed Care – PPO | Admitting: Oncology

## 2012-02-24 ENCOUNTER — Other Ambulatory Visit: Payer: BC Managed Care – PPO | Admitting: Lab

## 2012-03-13 ENCOUNTER — Telehealth: Payer: Self-pay | Admitting: Oncology

## 2012-03-13 NOTE — Telephone Encounter (Signed)
Per Kathi/DM moved 8/9 appt out 2-3 wks w/[facility]. lmonvm for pt on both cell and home phone re change w/new d/t for 8/23 @ 12:30 pm.

## 2012-03-15 ENCOUNTER — Encounter: Payer: Self-pay | Admitting: Internal Medicine

## 2012-03-15 ENCOUNTER — Ambulatory Visit (INDEPENDENT_AMBULATORY_CARE_PROVIDER_SITE_OTHER): Payer: BC Managed Care – PPO | Admitting: Internal Medicine

## 2012-03-15 VITALS — BP 122/74 | HR 66 | Temp 98.0°F | Ht 63.75 in | Wt 133.0 lb

## 2012-03-15 DIAGNOSIS — Z0000 Encounter for general adult medical examination without abnormal findings: Secondary | ICD-10-CM

## 2012-03-15 DIAGNOSIS — Z853 Personal history of malignant neoplasm of breast: Secondary | ICD-10-CM

## 2012-03-15 MED ORDER — CILIDINIUM-CHLORDIAZEPOXIDE 2.5-5 MG PO CAPS: 1.0000 | ORAL_CAPSULE | Freq: Three times a day (TID) | ORAL | Status: DC | PRN

## 2012-03-15 NOTE — Progress Notes (Signed)
  Subjective:    Patient ID: Gabriela Carrillo, female    DOB: 1956/06/25, 56 y.o.   MRN: [id number]  HPI  Gabriela Carrillo is here for a physical; she denies acute issues .       Review of Systems Patient reports no significant  vision/ hearing  changes, adenopathy,fever, weight change,  persistant / recurrent hoarseness , swallowing issues, chest pain,palpitations,edema,persistant /recurrent cough, hemoptysis, dyspnea( rest/ exertional/paroxysmal nocturnal), gastrointestinal bleeding(melena, rectal bleeding), abdominal pain, significant heartburn,  bowel changes,GU symptoms(dysuria, hematuria,pyuria, incontinence), Gyn symptoms(abnormal  bleeding , pain),  syncope, focal weakness, memory loss,numbness & tingling, skin/hair /nail changes,abnormal bruising or bleeding, anxiety,or depression.      Objective:   Physical Exam Gen.: Healthy and well-nourished in appearance. Alert, appropriate and cooperative throughout exam. Head: Normocephalic without obvious abnormalities  Eyes: No corneal or conjunctival inflammation noted. Pupils equal round reactive to light and accommodation. Fundal exam is benign without hemorrhages, exudate, papilledema. Extraocular motion intact. Vision grossly normal. Ears: External  ear exam reveals no significant lesions or deformities. Canals clear .TMs normal. Hearing is grossly normal bilaterally. Nose: External nasal exam reveals no deformity or inflammation. Nasal mucosa are pink and moist. No lesions or exudates noted.  Mouth: Oral mucosa and oropharynx reveal no lesions or exudates. Teeth in good repair. Neck: No deformities, masses, or tenderness noted. Range of motion & Thyroid normal Lungs: Normal respiratory effort; chest expands symmetrically. Lungs are clear to auscultation without rales, wheezes, or increased work of breathing. Heart: Normal rate and rhythm. Normal S1 and S2. No gallop, click, or rub. No murmur. Abdomen: Bowel sounds normal; abdomen soft and  nontender. No masses, organomegaly or hernias noted. Genitalia: Dr Chevis Pretty                                     Musculoskeletal/extremities: No deformity or scoliosis noted of  the thoracic or lumbar spine. No clubbing, cyanosis, edema noted. Range of motion  normal .Tone & strength  normal.Joints :minor DJD finger changes. Nail health  good. Vascular: Carotid, radial artery, dorsalis pedis and  posterior tibial pulses are full and equal. No bruits present. Neurologic: Alert and oriented x3. Deep tendon reflexes symmetrical and normal.          Skin: Intact without suspicious lesions or rashes. Lymph: No cervical, axillary lymphadenopathy present. Psych: Mood and affect are normal. Normally interactive                                                                                         Assessment & Plan: #1 comprehensive physical exam; no acute findings #2 see Problem List with Assessments & Recommendations Plan: see Orders

## 2012-03-15 NOTE — Patient Instructions (Addendum)
Preventive Health Care: Exercise  30-45  minutes a day, 3-4 days a week. Walking is especially valuable in preventing Osteoporosis. Eat a low-fat diet with lots of fruits and vegetables, up to 7-9 servings per day.  Consume less than 30 grams of sugar per day from foods & drinks with High Fructose Corn Syrup as #1,2,3 or #4 on label. Health Care Power of Attorney & Living Will place you in charge of your health care  decisions. Verify these are  in place. Please  add : Lipids,  TSH to labs [facility]/30/12 @ [facility].Code: V70.0.  Please try to go on My Chart  to allow me to release the results directly to you.

## 2012-03-16 ENCOUNTER — Other Ambulatory Visit: Payer: BC Managed Care – PPO | Admitting: Lab

## 2012-03-16 ENCOUNTER — Ambulatory Visit: Payer: BC Managed Care – PPO | Admitting: Oncology

## 2012-03-19 MED ORDER — CITALOPRAM HYDROBROMIDE 40 MG PO TABS
40 MG | ORAL_TABLET | ORAL | Status: DC
Start: 2012-03-19 — End: 2012-06-16

## 2012-03-19 NOTE — Telephone Encounter (Signed)
Last seen 06/15/11, told to fu in 1 yr.  No appt on file

## 2012-03-23 MED ORDER — CYCLOBENZAPRINE HCL 10 MG PO TABS
10 MG | ORAL_TABLET | Freq: Three times a day (TID) | ORAL | Status: DC | PRN
Start: 2012-03-23 — End: 2012-07-23

## 2012-03-23 MED ORDER — METHYLPREDNISOLONE 4 MG PO TBPK
4 MG | PACK | ORAL | Status: AC
Start: 2012-03-23 — End: 2012-03-29

## 2012-03-23 MED ORDER — HYDROXYZINE HCL 50 MG PO TABS
50 MG | ORAL_TABLET | ORAL | Status: DC
Start: 2012-03-23 — End: 2012-09-25

## 2012-03-23 MED ORDER — HYDROCODONE-ACETAMINOPHEN 5-500 MG PO TABS
5-500 MG | ORAL_TABLET | Freq: Four times a day (QID) | ORAL | Status: DC | PRN
Start: 2012-03-23 — End: 2012-06-15

## 2012-03-23 MED ORDER — MELOXICAM 15 MG PO TABS
15 MG | ORAL_TABLET | ORAL | Status: DC
Start: 2012-03-23 — End: 2013-06-17

## 2012-03-23 MED ORDER — MELOXICAM 15 MG PO TABS
15 MG | ORAL_TABLET | ORAL | Status: DC
Start: 2012-03-23 — End: 2012-03-23

## 2012-03-23 MED ORDER — ZOLPIDEM TARTRATE 10 MG PO TABS
10 MG | ORAL_TABLET | Freq: Every evening | ORAL | Status: DC | PRN
Start: 2012-03-23 — End: 2012-06-15

## 2012-03-23 MED ORDER — HYDROXYZINE HCL 50 MG PO TABS
50 MG | ORAL_TABLET | ORAL | Status: DC
Start: 2012-03-23 — End: 2012-03-23

## 2012-03-23 NOTE — Progress Notes (Signed)
Subjective:      Patient ID: Cassandra Copeland is a 56 y.o. female.    Back Pain  This is a recurrent problem. The current episode started in the past 7 days (onset with stiffness in lumbar region on 8/11). The problem occurs constantly (with severe sharp spasms of pain with motion). The problem has been gradually worsening since onset. The pain is present in the lumbar spine and gluteal. The quality of the pain is described as burning, shooting and stabbing. The pain radiates to the left thigh and left foot. The pain is at a severity of 10/10 (pt says 12). The pain is severe. The pain is the same all the time. The symptoms are aggravated by bending, twisting and standing. Stiffness is present all day. Associated symptoms include leg pain and weakness (L leg is uncertain re hold pt up-never like this before). Pertinent negatives include no abdominal pain, bladder incontinence, bowel incontinence, fever, numbness, paresis or paresthesias. Risk factors include lack of exercise, menopause and obesity. She has tried analgesics, NSAIDs and muscle relaxant for the symptoms. The treatment provided mild relief.     Chief Complaint   Patient presents with   ??? Back Pain     Patient states she went to urgent care. Threw out her back.       Review of Systems   Constitutional: Negative for fever.   Gastrointestinal: Negative.  Negative for abdominal pain and bowel incontinence.   Genitourinary: Negative.  Negative for bladder incontinence.   Musculoskeletal: Positive for back pain and gait problem. Negative for myalgias.   Skin: Negative.    Neurological: Positive for weakness (L leg is uncertain re hold pt up-never like this before). Negative for numbness and paresthesias.       Objective:   Physical Exam   Nursing note and vitals reviewed.  Constitutional: She is oriented to person, place, and time. She appears well-developed and well-nourished. She appears distressed.   Cardiovascular:   Pulses:       Dorsalis pedis pulses are 2+  on the right side, and 2+ on the left side.        Posterior tibial pulses are 2+ on the right side, and 2+ on the left side.   Neurological: She is alert and oriented to person, place, and time. No sensory deficit. Gait (uncertain on L leg and pain wit gait) abnormal.   Reflex Scores:       Patellar reflexes are 3+ on the right side and 2+ on the left side.       Achilles reflexes are 2+ on the right side and 2+ on the left side.  SLR R-//L+  wkness of L knee flexor;not weak distally   Skin: Skin is warm. No rash noted. No pallor.   Psychiatric: She has a normal mood and affect. Her behavior is normal.     BP 134/90   Pulse 65   Temp(Src) 98.2 ??F (36.8 ??C) (Oral)   Ht 5\' 4"  (1.626 m)   Wt 194 lb 9.6 oz (88.27 kg)   BMI 33.39 kg/m2   SpO2 98%    Assessment:      Cassandra Copeland was seen today for back pain.    Diagnoses and associated orders for this visit:    Lumbosacral radiculopathy at L3  - HYDROcodone-acetaminophen (VICODIN) 5-500 MG per tablet; Take 1 tablet by mouth every 6 hours as needed.  - MRI lumbar spine without contrast; Future  - methylPREDNISolone (MEDROL, PAK,) 4 MG tablet; Take  by mouth.    Fibromyalgia  - meloxicam (MOBIC) 15 MG tablet; TAKE 1 TABLET DAILY  - cyclobenzaprine (FLEXERIL) 10 MG tablet; Take 1 tablet by mouth 3 times daily as needed.  - hydrOXYzine (ATARAX) 50 MG tablet; TAKE 1 TABLET EVERY 8 HOURS AS NEEDED FOR ITCHING    Other Orders  - ibuprofen (ADVIL;MOTRIN) 800 MG tablet; Take 800 mg by mouth every 8 hours as needed.  - Discontinue: meloxicam (MOBIC) 15 MG tablet; TAKE 1 TABLET DAILY  - Discontinue: hydrOXYzine (ATARAX) 50 MG tablet; TAKE 1 TABLET EVERY 8 HOURS AS NEEDED FOR ITCHING  - zolpidem (AMBIEN) 10 MG tablet; Take 1 tablet by mouth nightly as needed.              Plan:      No Follow-up on file.   Patient Instructions   Please call if increased radiating pain in legs or bowel/bladder dysfunction.           F/u depending on test results

## 2012-03-23 NOTE — Patient Instructions (Signed)
Please call if increased radiating pain in legs or bowel/bladder dysfunction.

## 2012-03-30 ENCOUNTER — Telehealth: Payer: Self-pay | Admitting: Oncology

## 2012-03-30 ENCOUNTER — Other Ambulatory Visit (HOSPITAL_BASED_OUTPATIENT_CLINIC_OR_DEPARTMENT_OTHER): Payer: BC Managed Care – PPO | Admitting: Lab

## 2012-03-30 ENCOUNTER — Ambulatory Visit (HOSPITAL_BASED_OUTPATIENT_CLINIC_OR_DEPARTMENT_OTHER): Payer: BC Managed Care – PPO | Admitting: Family

## 2012-03-30 ENCOUNTER — Encounter: Payer: Self-pay | Admitting: Family

## 2012-03-30 VITALS — BP 134/77 | HR 74 | Temp 97.3°F | Resp 18 | Ht 63.5 in | Wt 132.0 lb

## 2012-03-30 DIAGNOSIS — Z853 Personal history of malignant neoplasm of breast: Secondary | ICD-10-CM

## 2012-03-30 LAB — COMPREHENSIVE METABOLIC PANEL
ALT: 19 U/L (ref 0–35)
AST: 23 U/L (ref 0–37)
Albumin: 4.2 g/dL (ref 3.5–5.2)
CO2: 30 mEq/L (ref 19–32)
Calcium: 9.2 mg/dL (ref 8.4–10.5)
Sodium: 143 mEq/L (ref 135–145)
Total Bilirubin: 0.5 mg/dL (ref 0.3–1.2)

## 2012-03-30 LAB — CBC WITH DIFFERENTIAL/PLATELET
BASO%: 0.2 % (ref 0.0–2.0)
EOS%: 0.3 % (ref 0.0–7.0)
LYMPH%: 30 % (ref 14.0–49.7)
MCHC: 35.6 g/dL (ref 31.5–36.0)
MONO#: 0.2 10*3/uL (ref 0.1–0.9)
Platelets: 193 10*3/uL (ref 145–400)
RBC: 4.92 10*6/uL (ref 3.70–5.45)
WBC: 5.8 10*3/uL (ref 3.9–10.3)
lymph#: 1.7 10*3/uL (ref 0.9–3.3)

## 2012-03-30 NOTE — Patient Instructions (Addendum)
Patient ID: RYLA CAUTHON,   DOB: Oct 12, 1955,  MRN: [id number]   [facility] Discharge Instructions  RECOMMENDATIONS MAD BY THE CONSULTANT AND ANY TEST RESULT(S) WILL BE FORWARDED TO YOU REFERRING DOCTOR   EXAM FINDINGS BY NURSE PRACTITIONER TODAY TO REPORT TO THE CLINIC OR PRIMARY PROVIDER:    Your Current Medications Are: Current Outpatient Prescriptions  Medication Sig Dispense Refill  . clidinium-chlordiazePOXIDE (LIBRAX) 2.5-5 MG per capsule Take 1 capsule by mouth 3 (three) times daily as needed.  30 capsule  2  . FLUoxetine (PROZAC) 20 MG capsule TAKE ONE CAPSULE BY MOUTH ONE TIME DAILY  90 capsule  1  . Multiple Vitamin (MULTIVITAMIN) tablet Take 1 tablet by mouth daily.           INSTRUCTIONS GIVEN, DISCUSSED AND FOLLOW-UP: Keep up the great work.   I acknowledge that I have been informed and understand all the instructions given to me and have received a copy.  I do not have any further questions at this time, but I understand that I may call the [facility] at [phone] during business hours should I have any further questions or need assistance in obtaining follow-up care.   03/30/2012, 1:29 PM

## 2012-03-30 NOTE — Telephone Encounter (Signed)
gve the pt her feb 2014 appt calendar °

## 2012-03-30 NOTE — Progress Notes (Signed)
Patient ID: Gabriela Carrillo, female   DOB: Oct 26, 1955, 56 y.o.   MRN: [id number] CSN: [id number]  CC: Titus Dubin. Alwyn Ren, MD,FACP,FCCP  Leatha Gilding. Mezer, M.D.   Problem List: Gabriela Carrillo is a 56 y.o. Caucasian female with a problem list consisting of:  1. Invasive ductal carcinoma of the left breast, stage IIB (T2 N1), grade 2/3, a 2.2-cm primary, 1 sentinel lymph node positive,  lymphovascular invasion present, estrogen receptor 73%, progesterone receptor 96%, proliferative fraction 17%, DNA index 0.98, diploid, and HER-2/neu negative. Diagnosis was established in April 2004. She had lumpectomy and sentinel lymph node biopsy  on 11/25/2002, followed by 6 cycles of adjuvant chemotherapy with Cytoxan, Adriamycin, and 5-fluorouracil from 12/19/2002 through 04/03/2003. Radiation treatments from September 2004 through June 10, 2003 and then adjuvant tamoxifen from mid November 2004 through November 2009. The patient had a Port-A-Cath from June 2004 through October 2004. She remains free of recurrent disease.  2. Irritable bowel syndrome.  3. History of depression and anxiety.  4. History of mitral valve prolapse.  Gabriela Carrillo was seen today for follow-up of her stage IIB (T2,N1) invasive ductal carcinoma of the left breast with diagnosis going  back to April 2004. Gabriela Carrillo is here today with her husband, Gabriela Carrillo, who has been undergoing chemotherapy for non-Hodgkin lymphoma under the care of Dr. Christin Bach.  Gabriela Carrillo was last seen here on 08/25/2011. Her condition remains stable and she remains disease-free.  The patient continues to see Dr. Teodora Medici as she does retain her uterus.  Mrs. Lowdermilk denies any vaginal bleeding.  The patient's last chest x-ray was January, 2013. Her last mammogram was on 01/26/2012. She is without complaint today and feels well.   Past Medical History: Past Medical History  Diagnosis Date  . Hypokalemia     PMH OF  . Cancer     BREAST,PMH OF  . Allergy to sulfa drugs      itching w/o rash  . 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 Depression     PMH of    Surgical History: Past Surgical History  Procedure Date  . Breast lumpectomy 2004    CHEMOTHERAPY  FOLLOWED BY RADIATION  . Cesarean section   . G3 p2   . Bunionectomy bilaterally   . Colonoscopy 2009    negative    Current Medications: Current Outpatient Prescriptions  Medication Sig Dispense Refill  . clidinium-chlordiazePOXIDE (LIBRAX) 2.5-5 MG per capsule Take 1 capsule by mouth 3 (three) times daily as needed.  30 capsule  2  . FLUoxetine (PROZAC) 20 MG capsule TAKE ONE CAPSULE BY MOUTH ONE TIME DAILY  90 capsule  1  . Multiple Vitamin (MULTIVITAMIN) tablet Take 1 tablet by mouth daily.          Allergies: Allergies  Allergen Reactions  . Sulfonamide Derivatives     REACTION: ITCHING     Family History: Family History  Problem Relation Age of Onset  . Diabetes Mother   . Anxiety disorder Mother   . Goiter Mother   . Hypertension Sister     X56  . Arthritis Sister     Rheumatoid   . Cancer Neg Hx   . Heart disease Neg Hx   . Stroke Neg Hx     Social History: History  Substance Use Topics  . Smoking status: Never Smoker   . Smokeless tobacco: Never Used  . Alcohol Use: Yes      rarely  Review of Systems: 10 Point review of systems was completed and is negative.   Physical Exam:   Blood pressure 134/77, pulse 74, temperature 97.3 F (36.3 C), temperature source Oral, resp. rate 18, height 5' 3.5" (1.613 m), weight 132 lb (59.875 kg).  General appearance: Alert, cooperative, well nourished, no apparent distress Head: Normocephalic, without obvious abnormality, atraumatic Eyes: Conjunctivae/corneas clear, PERRLA, EOMI, no scleral icterus Nose: Nares, septum and mucosa are normal, no drainage or sinus tenderness Neck: No adenopathy, supple, symmetrical, trachea midline, thyroid not enlarged, no tenderness Resp: Clear to auscultation  bilaterally Breasts: Appearance changes due to radiation, no masses or tenderness, well healed surgical scar on left breast Cardio: Regular rate and rhythm, S1, S2 normal, no murmur, click, rub or gallop GI: Soft, non-tender, bowel sounds are normal, no masses,  no organomegaly Extremities: Extremities are normal, atraumatic, no cyanosis or edema Lymph nodes: Cervical, supraclavicular, and axillary nodes normal Neurologic: Grossly normal   Laboratory Data: Results for orders placed in visit on 03/30/12 (from the past 48 hour(s))  CBC WITH DIFFERENTIAL     Status: Normal   Collection Time   03/30/12 12:19 PM      Component Value Range Comment   WBC 5.8  3.9 - 10.3 10e3/uL    NEUT# 3.8  1.5 - 6.5 10e3/uL    HGB 15.2  11.6 - 15.9 g/dL    HCT 16.1  09.6 - 04.5 %    Platelets 193  145 - 400 10e3/uL    MCV 86.8  79.5 - 101.0 fL    MCH 30.9  25.1 - 34.0 pg    MCHC 35.6  31.5 - 36.0 g/dL    RBC 4.09  8.11 - 9.14 10e6/uL    RDW 12.1  11.2 - 14.5 %    lymph# 1.7  0.9 - 3.3 10e3/uL    MONO# 0.2  0.1 - 0.9 10e3/uL    Eosinophils Absolute 0.0  0.0 - 0.5 10e3/uL    Basophils Absolute 0.0  0.0 - 0.1 10e3/uL    NEUT% 65.3  38.4 - 76.8 %    LYMPH% 30.0  14.0 - 49.7 %    MONO% 4.2  0.0 - 14.0 %    EOS% 0.3  0.0 - 7.0 %    BASO% 0.2  0.0 - 2.0 %      Imaging Studies: 1. A digital diagnostic mammogram was carried out on 12/02/2009 was negative. 2. A digital screening mammogram was carried out on 12/06/2010 was negative. 3. Chest x-ray, 2 view on 06/18/2010 showed no significant abnormalities. 4. Chest x-ray, 2 view on 08/25/2011 showed no active cardiopulmonary abnormalities.    Impression/Plan: Gabriela Carrillo is now 9 years from the time of diagnosis. She continues to do well without any suggestion of recurrent disease. She will  We will plan to see her again in 6 months, at which time we will check CBC, LDH and CMP.  Dr. Arline Asp feels that the annual chest x-ray is not necessary at this time.  The  patient and her husband are aware to contact us for any questions or concerns if they arise.   Amaka Gluth NP-C 03/30/2012, 1:55 PM

## 2012-04-24 MED ORDER — NYSTATIN 100000 UNIT/GM EX POWD
100000 UNIT/GM | CUTANEOUS | Status: DC
Start: 2012-04-24 — End: 2013-06-17

## 2012-04-24 NOTE — Telephone Encounter (Signed)
Called in to [facility]

## 2012-05-14 MED ORDER — SULFAMETHOXAZOLE-TRIMETHOPRIM 800-160 MG PO TABS
800-160 MG | ORAL_TABLET | Freq: Two times a day (BID) | ORAL | Status: DC
Start: 2012-05-14 — End: 2012-05-24

## 2012-05-14 NOTE — Patient Instructions (Addendum)
Warm soaks      If comes to head,office visit for drainage.

## 2012-05-14 NOTE — Progress Notes (Signed)
Subjective:      Patient ID: Cassandra Copeland is a 56 y.o. female.    HPI   Chief Complaint   Patient presents with   ??? Other     boil on stomach x 4-5 days;no drainage;hx mrsa 2010;see chart;no drainage;         Review of Systems   All other systems reviewed and are negative.        Objective:   Physical Exam   Abdominal:        BP 138/72   Pulse 84   Temp(Src) 97.7 ??F (36.5 ??C) (Oral)   Resp 12   Ht 5\' 4"  (1.626 m)   Wt 194 lb (87.998 kg)   BMI 33.28 kg/m2      Assessment:      Cassandra Copeland was seen today for other.    Diagnoses and associated orders for this visit:    Boil, groin  - sulfamethoxazole-trimethoprim (BACTRIM DS) 800-160 MG per tablet; Take 1 tablet by mouth 2 times daily for 10 days.              Plan:      Return if symptoms worsen or fail to improve.  Patient Instructions   Warm soaks      If comes to head,office visit for drainage.

## 2012-05-24 ENCOUNTER — Telehealth

## 2012-05-24 MED ORDER — SULFAMETHOXAZOLE-TRIMETHOPRIM 800-160 MG PO TABS
800-160 MG | ORAL_TABLET | Freq: Two times a day (BID) | ORAL | Status: DC
Start: 2012-05-24 — End: 2017-02-27

## 2012-05-24 NOTE — Telephone Encounter (Signed)
Sent [facility]

## 2012-05-24 NOTE — Telephone Encounter (Signed)
Patient would like to know if she could get more antibiotics called in for the sore that you had her on them for.  She says it is almost gone, but she finished the Bactrim & believes she needs more.

## 2012-05-24 NOTE — Telephone Encounter (Signed)
Left message for patient to call office. Given number [phone].

## 2012-05-28 NOTE — Telephone Encounter (Signed)
Patient stated she got it

## 2012-06-15 ENCOUNTER — Encounter

## 2012-06-15 MED ORDER — HYDROCODONE-ACETAMINOPHEN 5-500 MG PO TABS
5-500 MG | ORAL_TABLET | Freq: Four times a day (QID) | ORAL | Status: DC | PRN
Start: 2012-06-15 — End: 2012-09-21

## 2012-06-15 MED ORDER — ZOLPIDEM TARTRATE 10 MG PO TABS
10 MG | ORAL_TABLET | Freq: Every evening | ORAL | Status: DC | PRN
Start: 2012-06-15 — End: 2012-09-25

## 2012-06-15 NOTE — Telephone Encounter (Signed)
Medication: Hydrocodone/Acetaminophen 5/500mg  and Zolpidem Tartrate 10 mg  Last ov: 06/15/11-check up  Pending: None-due now

## 2012-06-18 MED ORDER — CITALOPRAM HYDROBROMIDE 40 MG PO TABS
40 MG | ORAL_TABLET | ORAL | Status: DC
Start: 2012-06-18 — End: 2013-06-17

## 2012-07-11 ENCOUNTER — Telehealth: Payer: Self-pay | Admitting: Internal Medicine

## 2012-07-11 ENCOUNTER — Telehealth: Payer: Self-pay

## 2012-07-11 NOTE — Telephone Encounter (Signed)
Message copied by Verner Chol on Wed Jul 11, 2012  2:50 PM ------      Message from: Maurice Small      Created: Wed Jul 11, 2012  2:48 PM       1:00 pm tomorrow with Alfonse Flavors, arm pain

## 2012-07-11 NOTE — Telephone Encounter (Signed)
Pt LMOVM stating having pain in arm, which is the same arm she had node removed several years ago because of cancer. Pt requesting to be call and advised what she should do. Plz advise pt UJ#[phone] MW

## 2012-07-11 NOTE — Telephone Encounter (Signed)
Spoke with patient, appointment scheduled for tomorrow at 1:00 pm

## 2012-07-11 NOTE — Telephone Encounter (Signed)
Done

## 2012-07-12 ENCOUNTER — Encounter: Payer: Self-pay | Admitting: Internal Medicine

## 2012-07-12 ENCOUNTER — Ambulatory Visit (INDEPENDENT_AMBULATORY_CARE_PROVIDER_SITE_OTHER): Payer: BC Managed Care – PPO | Admitting: Internal Medicine

## 2012-07-12 VITALS — BP 128/72 | HR 78 | Temp 98.5°F | Wt 135.0 lb

## 2012-07-12 DIAGNOSIS — IMO0002 Reserved for concepts with insufficient information to code with codable children: Secondary | ICD-10-CM

## 2012-07-12 DIAGNOSIS — S56919A Strain of unspecified muscles, fascia and tendons at forearm level, unspecified arm, initial encounter: Secondary | ICD-10-CM

## 2012-07-12 MED ORDER — TRAMADOL HCL 50 MG PO TABS: 50.0000 mg | ORAL_TABLET | Freq: Three times a day (TID) | ORAL | Status: DC | PRN

## 2012-07-12 NOTE — Patient Instructions (Addendum)
Use an anti-inflammatory cream such as Aspercreme or Zostrix cream 2-3 a day to the left elbow as needed. In lieu of this warm moist compresses or  hot water bottle can be used. Do not apply ice .

## 2012-07-12 NOTE — Progress Notes (Signed)
  Subjective:    Patient ID: Gabriela Carrillo, female    DOB: 1956/01/01, 56 y.o.   MRN: [id number]  HPI  She describes intermittent throbbing pain chiefly around the left lateral elbow and forearm area for the last week. There was no definite injury but she has noted localized swelling and slight bruising at the lateral elbow. Symptoms  began after decorating for Christmas.  The discomfort does radiate distally to the ulnar aspect of the hand. She also has intermittent numbness of the right. There are less severe pain in the medial aspect of the upper arm .  Nonsteroidal agents & heat do help some; dependency aggravates his symptoms.  She's concerned as she had a sentinal node removed from the left axilla    Review of Systems Constitutional: no fever, chills, sweats, change in weight  Musculoskeletal:no  muscle cramps or pain; no  joint stiffness or  redness Skin:no rash, temp change Neuro: no weakness; incontinence (stool/urine) Heme:no lymphadenopathy       Objective:   Physical Exam Gen.: Thin but well-nourished in appearance. Alert, appropriate and cooperative throughout exam.  Neck: No deformities, masses, or tenderness noted. Range of motion normal                                                                           Musculoskeletal/extremities: No deformity or scoliosis noted of  the thoracic or lumbar spine. No clubbing, cyanosis, or edema.DIP osteoarthritic finger changes  noted. Range of motion  normal .Tone & strength  normal. Nail health  good. Tinel's slightly positive left wrist. Finkelstein's test is equivocal on the left. There is tenderness, slight swelling, and slight ecchymosis of the left her major muscle proximally. Vascular:  radial artery pulses are full and equal.  Neurologic: Alert and oriented x3. Deep tendon reflexes symmetrical and normal. Light touch normal over thumbs.           Skin: Intact without suspicious lesions or rashes. Faint bruise as  noted Lymph: No cervical, axillary, or epitrochlear lymphadenopathy present. Psych: Mood and affect are normal. Normally interactive                                                                                         Assessment & Plan:  #1 pain, swelling, and ecchymosis over the proximal olecranon muscles; most likely injury related to repetitive motion while decorating for holidays. No definite tenosynovitis at the elbow or wrist documented. +/-  Tinel's left wrist Plan: See orders and recommendations   #2 subjective paresthesia of the thumb, not documented

## 2012-07-23 MED ORDER — CYCLOBENZAPRINE HCL 10 MG PO TABS
10 MG | ORAL_TABLET | ORAL | Status: DC
Start: 2012-07-23 — End: 2012-09-25

## 2012-07-23 NOTE — Telephone Encounter (Signed)
Last seen 03/23/12, no appt on file

## 2012-08-06 MED ORDER — GABAPENTIN 600 MG PO TABS
600 MG | ORAL_TABLET | ORAL | Status: DC
Start: 2012-08-06 — End: 2013-06-17

## 2012-08-06 NOTE — Telephone Encounter (Signed)
Last seen 03/23/12, no appt on file

## 2012-08-22 ENCOUNTER — Other Ambulatory Visit: Payer: Self-pay | Admitting: Internal Medicine

## 2012-09-11 LAB — HM PAP SMEAR

## 2012-09-21 MED ORDER — HYDROCODONE-ACETAMINOPHEN 5-325 MG PO TABS
5-325 MG | ORAL_TABLET | Freq: Four times a day (QID) | ORAL | Status: DC | PRN
Start: 2012-09-21 — End: 2012-11-27

## 2012-09-21 NOTE — Telephone Encounter (Signed)
LAST APPOINTMENT 05/2012

## 2012-09-21 NOTE — Telephone Encounter (Signed)
faxed

## 2012-09-22 ENCOUNTER — Other Ambulatory Visit: Payer: Self-pay

## 2012-09-25 ENCOUNTER — Encounter

## 2012-09-25 MED ORDER — HYDROXYZINE HCL 50 MG PO TABS
50 MG | ORAL_TABLET | ORAL | Status: DC
Start: 2012-09-25 — End: 2012-11-27

## 2012-09-25 MED ORDER — ZOLPIDEM TARTRATE 10 MG PO TABS
10 MG | ORAL_TABLET | Freq: Every evening | ORAL | Status: DC | PRN
Start: 2012-09-25 — End: 2012-11-27

## 2012-09-25 MED ORDER — CYCLOBENZAPRINE HCL 10 MG PO TABS
10 MG | ORAL_TABLET | Freq: Three times a day (TID) | ORAL | Status: DC | PRN
Start: 2012-09-25 — End: 2013-06-17

## 2012-09-25 NOTE — Telephone Encounter (Signed)
Last seen 03/23/12, no appt on file

## 2012-09-25 NOTE — Telephone Encounter (Signed)
rx faxed

## 2012-09-26 MED ORDER — HYDROXYZINE HCL 50 MG PO TABS
50 MG | ORAL_TABLET | ORAL | Status: DC
Start: 2012-09-26 — End: 2012-11-27

## 2012-09-26 MED ORDER — CYCLOBENZAPRINE HCL 10 MG PO TABS
10 MG | ORAL_TABLET | ORAL | Status: DC
Start: 2012-09-26 — End: 2012-11-27

## 2012-09-26 NOTE — Telephone Encounter (Signed)
This went to the wrong mail order so I resent it

## 2012-09-28 ENCOUNTER — Ambulatory Visit (HOSPITAL_BASED_OUTPATIENT_CLINIC_OR_DEPARTMENT_OTHER): Payer: BC Managed Care – PPO | Admitting: Oncology

## 2012-09-28 ENCOUNTER — Other Ambulatory Visit (HOSPITAL_BASED_OUTPATIENT_CLINIC_OR_DEPARTMENT_OTHER): Payer: BC Managed Care – PPO | Admitting: Lab

## 2012-09-28 ENCOUNTER — Encounter: Payer: Self-pay | Admitting: Oncology

## 2012-09-28 VITALS — BP 137/84 | HR 76 | Temp 98.1°F | Resp 20 | Ht 63.5 in | Wt 138.3 lb

## 2012-09-28 DIAGNOSIS — Z853 Personal history of malignant neoplasm of breast: Secondary | ICD-10-CM

## 2012-09-28 LAB — CBC WITH DIFFERENTIAL/PLATELET
Eosinophils Absolute: 0 10*3/uL (ref 0.0–0.5)
HGB: 15.8 g/dL (ref 11.6–15.9)
MONO#: 0.4 10*3/uL (ref 0.1–0.9)
NEUT#: 3.8 10*3/uL (ref 1.5–6.5)
RBC: 4.94 10*6/uL (ref 3.70–5.45)
RDW: 12.4 % (ref 11.2–14.5)
WBC: 5.8 10*3/uL (ref 3.9–10.3)

## 2012-09-28 LAB — COMPREHENSIVE METABOLIC PANEL (CC13)
AST: 27 U/L (ref 5–34)
Alkaline Phosphatase: 82 U/L (ref 40–150)
BUN: 7.8 mg/dL (ref 7.0–26.0)
Calcium: 9.4 mg/dL (ref 8.4–10.4)
Creatinine: 0.7 mg/dL (ref 0.6–1.1)

## 2012-09-28 NOTE — Progress Notes (Signed)
CC:   Gabriela Carrillo. Gabriela Ren, MD,FACP,FCCP Gabriela Carrillo, M.D.  PROBLEM LIST:  1. Invasive ductal carcinoma of the left breast, stage IIB (T2 N1),  grade 2/3, a 2.2-cm primary, 1 sentinel lymph node positive,  lymphovascular invasion present, estrogen receptor 73%,  progesterone receptor 96%, proliferative fraction 17%, DNA index  0.98, diploid, and HER-2/neu negative. Diagnosis was established  in April 2004. She had lumpectomy and sentinel lymph node biopsy  on 11/25/2002, followed by 6 cycles of adjuvant chemotherapy with  Cytoxan, Adriamycin, and 5-fluorouracil from 12/19/2002 through  04/03/2003. Radiation treatments from September 2004 through  June 10, 2003 and then adjuvant tamoxifen from mid November 2004  through November 2009. The patient had a Port-A-Cath from June  2004 through October 2004. She remains free of recurrent disease.  2. Irritable bowel syndrome.  3. History of depression and anxiety.  4. History of mitral valve prolapse.   MEDICATIONS:  Reviewed and recorded. Current Outpatient Prescriptions  Medication Sig Dispense Refill  . clidinium-chlordiazePOXIDE (LIBRAX) 2.5-5 MG per capsule Take 1 capsule by mouth 3 (three) times daily as needed.  30 capsule  2  . FLUoxetine (PROZAC) 20 MG capsule TAKE ONE CAPSULE BY MOUTH ONE TIME DAILY  90 capsule  1  . Multiple Vitamin (MULTIVITAMIN) tablet Take 1 tablet by mouth daily.        . traMADol (ULTRAM) 50 MG tablet Take 1 tablet (50 mg total) by mouth every 8 (eight) hours as needed for pain.  30 tablet  0   No current facility-administered medications for this visit.    SMOKING HISTORY:  Patient has never smoked cigarettes.    HISTORY:  I saw Gabriela Carrillo today for followup of her stage IIB (T2 N1) invasive ductal carcinoma of the left breast with diagnosis going back to April 2004.  Gabriela Carrillo was last seen by [place] on 03/30/2012 and prior to that on 08/25/2011.  There have been no changes in her condition. She  remains without any symptoms to suggest recurrent breast cancer. She has seen Dr. Chevis Pretty recently.  Last mammogram was on 01/26/2012 and her last chest x-ray on 08/25/2011, both negative.  The patient is without complaints today.  PHYSICAL EXAM:  General:  Gabriela Carrillo looks well.  Weight is 138 pounds 4.8 ounces, height 5 feet 3-1/2 inches, body surface area 1.68 sq m.  Vital Signs:  Blood pressure 137/84.  Other vital signs are normal.  HEENT: There is no scleral icterus.  Mouth and pharynx are benign.  There is no peripheral adenopathy palpable.  Heart and lungs:  Normal.  Breasts: Right breast is benign.  Left breast has a scar at the 12 o'clock position with a slightly firm nodule in the middle of this scar.  This is unchanged.  No axillary adenopathy.  Abdomen:  Benign with no organomegaly or masses palpable.  Extremities:  No peripheral edema or clubbing.  No lymphedema of the left arm.  Neurologic:  Normal.  LABORATORY DATA:  Today, white count 5.8, ANC 3.8, hemoglobin 15.8, hematocrit 44.2, platelets 211,000.  Chemistries today are pending. Chemistries from 03/30/2012 were normal, except for slightly elevated glucose of 108.  IMAGING STUDIES:  1. Digital screening mammograms were carried out on 12/06/2010 and  were negative.  2. Chest x-ray, 2 view on 06/18/2010 showed no significant  abnormalities. The patient will have a chest x-ray today. 3. Chest x-ray, 2 view, from 08/25/2011 showed no active cardiopulmonary abnormalities. 4. Digital screening mammogram on 01/26/2012 showed no  specific mammographic evidence of malignancy.   IMPRESSION:  Gabriela Carrillo is now out almost 10 years from the time of diagnosis.  She continues to do well without any signs of recurrent disease.  She will be due for another mammogram in June of this year. We will plan to see her again in 1 year, at which time we will check CBC and chemistries.    ______________________________ Samul Dada,  M.D. DSM/MEDQ  D:  09/28/2012  T:  09/28/2012  Job:  [id number]

## 2012-09-28 NOTE — Progress Notes (Signed)
This office note has been dictated.  #[id number]

## 2012-09-30 ENCOUNTER — Telehealth: Payer: Self-pay | Admitting: Oncology

## 2012-09-30 NOTE — Telephone Encounter (Signed)
lmonvm for pt re appt for 09/27/13 and mailed schedule.

## 2012-11-12 MED ORDER — MONTELUKAST SODIUM 10 MG PO TABS
10 MG | ORAL_TABLET | ORAL | Status: DC
Start: 2012-11-12 — End: 2012-11-27

## 2012-11-12 NOTE — Telephone Encounter (Signed)
Last routine appointment 06/15/2011

## 2012-11-18 ENCOUNTER — Other Ambulatory Visit: Payer: Self-pay | Admitting: Internal Medicine

## 2012-11-27 MED ORDER — AZITHROMYCIN 250 MG PO TABS
250 MG | PACK | ORAL | Status: DC
Start: 2012-11-27 — End: 2013-06-17

## 2012-11-27 MED ORDER — ZOLPIDEM TARTRATE 10 MG PO TABS
10 MG | ORAL_TABLET | Freq: Every evening | ORAL | Status: DC | PRN
Start: 2012-11-27 — End: 2013-06-17

## 2012-11-27 MED ORDER — HYDROXYZINE HCL 50 MG PO TABS
50 MG | ORAL_TABLET | ORAL | Status: DC
Start: 2012-11-27 — End: 2013-06-17

## 2012-11-27 MED ORDER — MONTELUKAST SODIUM 10 MG PO TABS
10 MG | ORAL_TABLET | ORAL | Status: DC
Start: 2012-11-27 — End: 2013-06-17

## 2012-11-27 MED ORDER — PROMETHAZINE-CODEINE 6.25-10 MG/5ML PO SYRP
Freq: Four times a day (QID) | ORAL | Status: DC | PRN
Start: 2012-11-27 — End: 2013-06-17

## 2012-11-27 MED ORDER — HYDROCODONE-ACETAMINOPHEN 5-325 MG PO TABS
5-325 MG | ORAL_TABLET | Freq: Four times a day (QID) | ORAL | Status: DC | PRN
Start: 2012-11-27 — End: 2013-06-17

## 2012-11-27 NOTE — Progress Notes (Signed)
Subjective:      Patient ID: Cassandra Copeland is a 57 y.o. female.    HPI   Chief Complaint   Patient presents with   ??? Cough     productive yellowish color x 3-4 days   ??? Head Congestion   ??? Medication Refill     all of them;all conditions stable;see list;         Review of Systems   Constitutional: Negative.    Gastrointestinal: Negative.    Genitourinary: Negative.    Skin: Negative.        Objective:   Physical Exam   Nursing note and vitals reviewed.  Constitutional: She appears well-developed and well-nourished.   HENT:   Head: Normocephalic.   Right Ear: Tympanic membrane and external ear normal.   Left Ear: Tympanic membrane and external ear normal.   Nose: Nose normal. No mucosal edema.   Mouth/Throat: Oropharynx is clear and moist.   Eyes: Conjunctivae are normal. No scleral icterus.   Neck: Neck supple. No thyromegaly present.   Cardiovascular: Normal rate, regular rhythm and normal heart sounds.    Pulmonary/Chest: Effort normal. She has no wheezes. She has no rales.   Scattered rhonchi   Musculoskeletal: She exhibits no edema.   Lymphadenopathy:     She has no cervical adenopathy.     She has no axillary adenopathy.        Right: No supraclavicular adenopathy present.        Left: No supraclavicular adenopathy present.   Skin: Skin is warm. No rash noted. No cyanosis. Nails show no clubbing.    BP 114/74   Pulse 80   Temp(Src) 98.5 ??F (36.9 ??C) (Oral)   Resp 16   Ht 5\' 4"  (1.626 m)   Wt 199 lb (90.266 kg)   BMI 34.14 kg/m2   SpO2 98%      Assessment:      Cassandra Copeland was seen today for cough, head congestion and medication refill.    Diagnoses and associated orders for this visit:    Cough  - 94760 - PR NONINVASV OXYGEN SATUR;SINGLE    Fibromyalgia  - hydrOXYzine (ATARAX) 50 MG tablet; TAKE 1 TABLET EVERY 8 HOURS AS NEEDED FOR ITCHING    Acute bronchitis    Other Orders  - HYDROcodone-acetaminophen (NORCO) 5-325 MG per tablet; Take 1 tablet by mouth every 6 hours as needed for Pain.  - zolpidem (AMBIEN) 10 MG  tablet; Take 1 tablet by mouth nightly as needed.  - montelukast (SINGULAIR) 10 MG tablet; TAKE 1 TABLET NIGHTLY  - azithromycin (ZITHROMAX) 250 MG tablet; Take 2 tabs (500 mg) on Day 1, and take 1 tab (250 mg) on days 2 through 5.  - promethazine-codeine (PHENERGAN WITH CODEINE) 6.25-10 MG/5ML syrup; Take 10 mLs by mouth every 6 hours as needed for Cough.              Plan:      Return if symptoms worsen or fail to improve.   There are no Patient Instructions on file for this visit.

## 2013-01-15 ENCOUNTER — Other Ambulatory Visit: Payer: Self-pay

## 2013-01-15 DIAGNOSIS — Z1231 Encounter for screening mammogram for malignant neoplasm of breast: Secondary | ICD-10-CM

## 2013-01-16 ENCOUNTER — Encounter

## 2013-02-13 ENCOUNTER — Other Ambulatory Visit: Payer: Self-pay

## 2013-02-13 ENCOUNTER — Ambulatory Visit
Admission: RE | Admit: 2013-02-13 | Discharge: 2013-02-13 | Disposition: A | Discharge status: Home / Self Care | Payer: BC Managed Care – PPO | Source: Ambulatory Visit

## 2013-02-13 DIAGNOSIS — Z1231 Encounter for screening mammogram for malignant neoplasm of breast: Secondary | ICD-10-CM

## 2013-02-13 DIAGNOSIS — Z853 Personal history of malignant neoplasm of breast: Secondary | ICD-10-CM

## 2013-02-28 ENCOUNTER — Other Ambulatory Visit: Payer: Self-pay

## 2013-02-28 MED ORDER — FLUOXETINE HCL 20 MG PO CAPS: ORAL_CAPSULE | ORAL | Status: DC

## 2013-02-28 NOTE — Telephone Encounter (Signed)
Last seen 07/12/12 and filled 08/22/12 #90 with 1 refill. Please advise      KP

## 2013-05-29 ENCOUNTER — Other Ambulatory Visit: Payer: Self-pay | Admitting: *Deleted

## 2013-05-29 ENCOUNTER — Other Ambulatory Visit: Payer: Self-pay | Admitting: Internal Medicine

## 2013-05-29 MED ORDER — FLUOXETINE HCL 20 MG PO CAPS: ORAL_CAPSULE | ORAL | Status: DC

## 2013-05-29 NOTE — Telephone Encounter (Signed)
#  30 , needs OV before next refill

## 2013-05-29 NOTE — Telephone Encounter (Signed)
Prozac refilled.

## 2013-05-29 NOTE — Telephone Encounter (Signed)
Refill requested for Prozac. Patient has not had an OV since 07/2012. Please advise.

## 2013-06-13 ENCOUNTER — Other Ambulatory Visit: Payer: Self-pay

## 2013-06-17 MED ORDER — HYDROCODONE-ACETAMINOPHEN 5-325 MG PO TABS
5-325 MG | ORAL_TABLET | Freq: Four times a day (QID) | ORAL | Status: DC | PRN
Start: 2013-06-17 — End: 2013-12-05

## 2013-06-17 MED ORDER — ZOLPIDEM TARTRATE 10 MG PO TABS
10 MG | ORAL_TABLET | Freq: Every evening | ORAL | Status: DC | PRN
Start: 2013-06-17 — End: 2013-12-05

## 2013-06-17 MED ORDER — HYDROXYZINE HCL 50 MG PO TABS
50 MG | ORAL_TABLET | ORAL | Status: DC
Start: 2013-06-17 — End: 2013-12-05

## 2013-06-17 MED ORDER — NYSTATIN 100000 UNIT/GM EX POWD
100000 UNIT/GM | CUTANEOUS | Status: DC
Start: 2013-06-17 — End: 2015-03-02

## 2013-06-17 MED ORDER — CITALOPRAM HYDROBROMIDE 40 MG PO TABS
40 MG | ORAL_TABLET | ORAL | Status: DC
Start: 2013-06-17 — End: 2013-12-05

## 2013-06-17 MED ORDER — GABAPENTIN 600 MG PO TABS
600 MG | ORAL_TABLET | ORAL | Status: DC
Start: 2013-06-17 — End: 2013-12-05

## 2013-06-17 MED ORDER — MONTELUKAST SODIUM 10 MG PO TABS
10 MG | ORAL_TABLET | ORAL | Status: DC
Start: 2013-06-17 — End: 2013-12-05

## 2013-06-17 MED ORDER — CYCLOBENZAPRINE HCL 10 MG PO TABS
10 MG | ORAL_TABLET | Freq: Three times a day (TID) | ORAL | Status: DC | PRN
Start: 2013-06-17 — End: 2013-09-23

## 2013-06-17 MED ORDER — MELOXICAM 15 MG PO TABS
15 MG | ORAL_TABLET | ORAL | Status: DC
Start: 2013-06-17 — End: 2013-12-05

## 2013-06-17 NOTE — Progress Notes (Signed)
Subjective:      Patient ID: Cassandra Copeland is a 57 y.o. female.    HPI  Chief Complaint   Patient presents with   ??? Anxiety     doijng well;   ??? Insomnia     not sleeping well;can't stop her brain from working-chores and tasks;       Review of Systems   Respiratory: Negative.    Cardiovascular: Negative.    Gastrointestinal: Negative.    Musculoskeletal: Positive for myalgias, back pain and arthralgias.       Objective:   Physical Exam   Constitutional: She appears well-developed and well-nourished.   HENT:   Head: Normocephalic.   Eyes: Conjunctivae are normal.   Neck: Neck supple.   Cardiovascular: Normal rate, regular rhythm and normal heart sounds.    Pulmonary/Chest: Effort normal and breath sounds normal. She has no wheezes. She has no rales.   Lymphadenopathy:     She has no cervical adenopathy.   Skin: Skin is warm. No rash noted.   Psychiatric: She has a normal mood and affect. Her behavior is normal. Thought content normal.   Nursing note and vitals reviewed.    BP 120/78   Pulse 69   Temp(Src) 98 ??F (36.7 ??C) (Oral)   Ht 5\' 4"  (1.626 m)   Wt 197 lb 9.6 oz (89.631 kg)   BMI 33.9 kg/m2   SpO2 98%    Assessment:      Cassandra Copeland was seen today for anxiety and insomnia.    Diagnoses and associated orders for this visit:    Fibromyalgia  - hydrOXYzine (ATARAX) 50 MG tablet; TAKE 1 TABLET EVERY 8 HOURS AS NEEDED FOR ITCHING  - meloxicam (MOBIC) 15 MG tablet; TAKE 1 TABLET DAILY    Other Orders  - HYDROcodone-acetaminophen (NORCO) 5-325 MG per tablet; Take 1 tablet by mouth every 6 hours as needed for Pain.  - zolpidem (AMBIEN) 10 MG tablet; Take 1 tablet by mouth nightly as needed.  - montelukast (SINGULAIR) 10 MG tablet; TAKE 1 TABLET NIGHTLY  - cyclobenzaprine (FLEXERIL) 10 MG tablet; Take 1 tablet by mouth 3 times daily as needed for Muscle spasms.  - gabapentin (NEURONTIN) 600 MG tablet; TAKE 1 TABLET TWICE A DAY AND ONE AND ONE-HALF TABLETS AT BEDTIME (UNUSED 1/2 TABLET SHOULD BE TAKEN AS NEXT  DOSE)  - citalopram (CELEXA) 40 MG tablet; TAKE 1 TABLET DAILY  - nystatin (MYCOSTATIN) 100000 UNIT/GM powder; Apply once a day and prn              Plan:      Return in about 6 months (around 12/15/2013).  There are no Patient Instructions on file for this visit.

## 2013-07-03 NOTE — Progress Notes (Signed)
Chief Complaint:  Shoulder Pain  (right)    History of Present Illness:  Cassandra Copeland is a 57 y.o. LHD female here regarding right shoulder pain. The pain is located in the anterior glenohumeral region. She describes the symptoms as aching. Symptoms improve with rest, avoiding the painful activities. The symptoms are worse with sleeping and trying to carry anything.  Patient reports her pain began approximately in September 2014 where she was pulling we then felt a pop and pain in her shoulder.  Since then she has been having a gradual ET pain that she describes like a toothache.  She has had a previous surgery approximate 7 years ago possible labral repair she is not sure exactly what it was.  Now she denies any numbness or tingling in her hand or fingers.     Medical History:  Patient's medications, allergies, past medical, surgical, social and family histories were reviewed and updated as appropriate.    Review of Systems:  Pertinent items are noted in HPI.  Relevant review of systems reviewed and available in the patient's chart    Vital Signs:  Filed Vitals:    07/03/13 1044   BP: 123/79   Pulse: 68       Pain Assessment:  Location of Pain: Shoulder  Location Modifiers: Right  Severity of Pain: 4  Quality of Pain: Aching  Duration of Pain: Persistent  Frequency of Pain: Constant  Aggravating Factors: Stretching  Relieving Factors: Rest  Result of Injury: Yes  Work-Related Injury: No  Are there other pain locations you wish to document?: No    General Exam:   Constitutional: Patient is adequately groomed with no evidence of malnutrition  Mental Status: The patient is oriented to time, place and person.  The patient's mood and affect are appropriate.  Neurological: The patient has good coordination.  There is no weakness or sensory deficit.    Shoulder Examination:  Inspection:  No gross deformities noted. No atrophy, erythema or ecchymosis. Skin is intact.    Palpation:  Tender to palpation in the anterior  aspect    Range of Motion:  Essentially full range of motion with slight deficit and internal rotation to back pocket    Strength:  Slght weakness with forward flexion    Special Tests:  Pain with Obriens    Skin: There are no rashes, ulcerations or lesions.    Additional Comments: Sensation is intact to light touch throughout the median, ulnar and radial nerve distribution. Able to wiggle fingers, give thumbs up, A-OK and cross index and middle fingers.     Additional Examinations:  Left Upper Extremity: Examination of the left upper extremity does not show any tenderness, deformity or injury.  Range of motion is unremarkable.  There is no gross instability.  There are no rashes, ulcerations or lesions.  Strength and tone are normal.    None  Diagnostic Test Findings:    4 views of the right shoulderMinimal arthritic changes, no fracture or dislocation    Assessment: right shoulder biceps tendinitis     Impression:  Encounter Diagnoses   Name Primary?   ??? Pain    ??? Biceps tendinitis on right Yes       Office Procedures:  Orders Placed This Encounter   Procedures   ??? Shoulder Right 4V     73030     Standing Status: Future      Number of Occurrences: 1      Standing Expiration Date: 07/03/2014  Order Specific Question:  Reason for exam:     Answer:  Pain       Treatment Plan:  We will plan on injecting her right shoulder near her biceps tendon.  She will follow-up with [place] on a p.r.n. basis if she continues to have trouble we may refer her for an MRI at that point. The risks and benefits of an injection were discussed with the patient.  The patient had full opportunity to ask questions and all were answered.  The patient then provided verbal informed consent.  The skin was then prepped with betadine solution and alcohol.  Under aseptic conditions, the  right shoulder, biceps tendon, was injected with 2cc of 1% xylocaine then a mixture of 2cc of 0.5% marcaine, and 1cc (40mg ) of Depomedrol.  There were no immediate  complications following the injection.  The patient was advised of the possibility of injection site reaction and instructed to apply ice to the area and take NSAIDs if able.     Eustaquio Boyden, PA-C, scribing for Dr. Warnell Forester

## 2013-07-03 NOTE — Progress Notes (Signed)
SITE: LEFT SHOULDER    MARCAINE  NDC# [id number]  LOT NUMBER#  [id number]    DEPO 40MG   NDC# 1610-9604-54  LOT NUMBER#  [id number]    LIDOCAINE    NDC# 1478-2956-21  LOT NUMBER#  [id number]

## 2013-07-04 ENCOUNTER — Other Ambulatory Visit: Payer: Self-pay | Admitting: Internal Medicine

## 2013-07-05 NOTE — Telephone Encounter (Signed)
Clidinium-chlordiazepoxide refilled per protocol

## 2013-07-31 ENCOUNTER — Other Ambulatory Visit: Payer: Self-pay | Admitting: Internal Medicine

## 2013-07-31 NOTE — Telephone Encounter (Signed)
Fluoxetine refilled. OV due. JG//CMA

## 2013-09-04 ENCOUNTER — Other Ambulatory Visit: Payer: Self-pay | Admitting: Internal Medicine

## 2013-09-05 NOTE — Telephone Encounter (Signed)
Requesting Prozac 20mg   Last refill:07-31-13:#30-pt was informed last refill that he needs an appt Last OV:07-12-12 Please advise.//AB/CMA

## 2013-09-05 NOTE — Telephone Encounter (Signed)
OK X1 but this can not be refilled w/o OV ; last OV 12/13

## 2013-09-06 ENCOUNTER — Telehealth: Payer: Self-pay | Admitting: *Deleted

## 2013-09-06 NOTE — Telephone Encounter (Signed)
LM @ (9:28am) asking the pt to RTC regarding refill request.//AB/CMA

## 2013-09-06 NOTE — Telephone Encounter (Signed)
Pt called back and spoke with [facility] and she informed her of the note below regarding scheduling appt.  Pt agreed and scheduled an appt for (09-09-13 ) for CPE.//AB/CMA

## 2013-09-09 ENCOUNTER — Encounter: Payer: Self-pay | Admitting: Internal Medicine

## 2013-09-09 ENCOUNTER — Other Ambulatory Visit: Payer: Self-pay | Admitting: Internal Medicine

## 2013-09-09 ENCOUNTER — Other Ambulatory Visit (INDEPENDENT_AMBULATORY_CARE_PROVIDER_SITE_OTHER): Payer: BC Managed Care – PPO

## 2013-09-09 ENCOUNTER — Ambulatory Visit (INDEPENDENT_AMBULATORY_CARE_PROVIDER_SITE_OTHER): Payer: BC Managed Care – PPO | Admitting: Internal Medicine

## 2013-09-09 VITALS — BP 148/90 | HR 79 | Temp 99.0°F | Ht 66.25 in | Wt 139.0 lb

## 2013-09-09 DIAGNOSIS — R740 Nonspecific elevation of levels of transaminase and lactic acid dehydrogenase [LDH]: Principal | ICD-10-CM

## 2013-09-09 DIAGNOSIS — M949 Disorder of cartilage, unspecified: Secondary | ICD-10-CM

## 2013-09-09 DIAGNOSIS — Z0000 Encounter for general adult medical examination without abnormal findings: Secondary | ICD-10-CM

## 2013-09-09 DIAGNOSIS — M8580 Other specified disorders of bone density and structure, unspecified site: Secondary | ICD-10-CM

## 2013-09-09 DIAGNOSIS — R7401 Elevation of levels of liver transaminase levels: Secondary | ICD-10-CM

## 2013-09-09 DIAGNOSIS — M899 Disorder of bone, unspecified: Secondary | ICD-10-CM

## 2013-09-09 DIAGNOSIS — M810 Age-related osteoporosis without current pathological fracture: Secondary | ICD-10-CM | POA: Insufficient documentation

## 2013-09-09 LAB — LIPID PANEL
CHOL/HDL RATIO: 3
CHOLESTEROL: 164 mg/dL (ref 0–200)
HDL: 54.9 mg/dL (ref >39.00)
LDL Cholesterol: 93 mg/dL (ref 0–99)
TRIGLYCERIDES: 83 mg/dL (ref 0.0–149.0)
VLDL: 16.6 mg/dL (ref 0.0–40.0)

## 2013-09-09 LAB — BASIC METABOLIC PANEL
BUN: 6 mg/dL (ref 6–23)
CALCIUM: 9 mg/dL (ref 8.4–10.5)
CO2: 28 mEq/L (ref 19–32)
Chloride: 107 mEq/L (ref 96–112)
Creatinine, Ser: 0.6 mg/dL (ref 0.4–1.2)
GFR: 115.55 mL/min (ref >60.00)
GLUCOSE: 84 mg/dL (ref 70–99)
POTASSIUM: 4.2 meq/L (ref 3.5–5.1)
Sodium: 143 mEq/L (ref 135–145)

## 2013-09-09 LAB — HEPATIC FUNCTION PANEL
ALBUMIN: 4 g/dL (ref 3.5–5.2)
ALT: 26 U/L (ref 0–35)
AST: 39 U/L — ABNORMAL HIGH (ref 0–37)
Alkaline Phosphatase: 68 U/L (ref 39–117)
Bilirubin, Direct: 0.1 mg/dL (ref 0.0–0.3)
TOTAL PROTEIN: 6.9 g/dL (ref 6.0–8.3)
Total Bilirubin: 0.7 mg/dL (ref 0.3–1.2)

## 2013-09-09 LAB — CBC WITH DIFFERENTIAL/PLATELET
BASOS ABS: 0 10*3/uL (ref 0.0–0.1)
BASOS PCT: 0.7 % (ref 0.0–3.0)
EOS ABS: 0 10*3/uL (ref 0.0–0.7)
Eosinophils Relative: 0.5 % (ref 0.0–5.0)
HEMATOCRIT: 44.7 % (ref 36.0–46.0)
HEMOGLOBIN: 14.9 g/dL (ref 12.0–15.0)
LYMPHS ABS: 1.5 10*3/uL (ref 0.7–4.0)
Lymphocytes Relative: 26 % (ref 12.0–46.0)
MCHC: 33.4 g/dL (ref 30.0–36.0)
MCV: 92.8 fl (ref 78.0–100.0)
Monocytes Absolute: 0.3 10*3/uL (ref 0.1–1.0)
Monocytes Relative: 5.3 % (ref 3.0–12.0)
NEUTROS ABS: 4 10*3/uL (ref 1.4–7.7)
Neutrophils Relative %: 67.5 % (ref 43.0–77.0)
Platelets: 226 10*3/uL (ref 150.0–400.0)
RBC: 4.81 Mil/uL (ref 3.87–5.11)
RDW: 12.7 % (ref 11.5–14.6)
WBC: 5.9 10*3/uL (ref 4.5–10.5)

## 2013-09-09 LAB — TSH: TSH: 0.71 u[IU]/mL (ref 0.35–5.50)

## 2013-09-09 NOTE — Progress Notes (Signed)
   Subjective:    Patient ID: Gabriela Carrillo, female    DOB: 1956-06-30, 58 y.o.   MRN: [id number]  HPI  She is here for a physical;acute issues include IBS     Review of Systems  She denies significant  dyspepsia, dysphagia, unexplained weight loss, abdominal pain, melena, rectal bleeding, or small caliber stools.She has intermittent  loose stool; she feels Lactose free milk has helped. Colonoscopy up to date. See BP ; BP @ home 130/75.     Objective:   Physical Exam Gen.: Healthy and well-nourished in appearance. Alert, appropriate and cooperative throughout exam.Appears younger than stated age  Head: Normocephalic without obvious abnormalities  Eyes: No corneal or conjunctival inflammation noted. Pupils equal round reactive to light and accommodation. Extraocular motion intact. Ears: External  ear exam reveals no significant lesions or deformities. Canals clear .TMs normal. Hearing is grossly normal bilaterally. Nose: External nasal exam reveals no deformity or inflammation. Nasal mucosa are pink and moist. No lesions or exudates noted.   Mouth: Oral mucosa and oropharynx reveal no lesions or exudates. Teeth in good repair. Neck: No deformities, masses, or tenderness noted. Range of motion & Thyroid normal.? L cervical rib Lungs: Normal respiratory effort; chest expands symmetrically. Lungs are clear to auscultation without rales, wheezes, or increased work of breathing. Heart: Normal rate and rhythm. Normal S1 and S2. No gallop, click, or rub. No murmur. Abdomen: Bowel sounds normal; abdomen soft and nontender. No masses, organomegaly or hernias noted. Genitalia: as per Gyn                                  Musculoskeletal/extremities: No deformity or scoliosis noted of  the thoracic or lumbar spine.   No clubbing, cyanosis, edema, or significant extremity  deformity noted. Range of motion normal .Tone & strength normal. Hand joints normal . Fingernail  health good.Minor crepitus of  knees. Able to lie down & sit up w/o help. Negative SLR bilaterally Vascular: Carotid, radial artery, dorsalis pedis and  posterior tibial pulses are full and equal. No bruits present. Neurologic: Alert and oriented x3. Deep tendon reflexes symmetrical and normal.        Skin: Intact without suspicious lesions or rashes. Lymph: No cervical, axillary lymphadenopathy present. Psych: Mood and affect are normal. Normally interactive                                                                                        Assessment & Plan:  #1 comprehensive physical exam; no acute findings  Plan: see Orders  & Recommendations

## 2013-09-09 NOTE — Patient Instructions (Addendum)
Your next office appointment will be determined based upon review of your pending labs . Those instructions will be transmitted to you through My Chart . Align or Florastor daily if the bowels are loose. This will replace the normal bacteria which  are necessary for formation of normal stool and processing of food.  Minimal Blood Pressure Goal= AVERAGE < 140/90;  Ideal is an AVERAGE < 135/85. This AVERAGE should be calculated from @ least 5-7 BP readings taken @ different times of day on different days of week. You should not respond to isolated BP readings , but rather the AVERAGE for that week .Please bring your  blood pressure cuff to office visits to verify that it is reliable.It  can also be checked against the blood pressure device at the pharmacy. Finger or wrist cuffs are not dependable; an arm cuff is. BMD with next mammograms.

## 2013-09-09 NOTE — Addendum Note (Signed)
Addended by: Harl Bowie on: 09/09/2013 10:33 AM   Modules accepted: Orders

## 2013-09-09 NOTE — Progress Notes (Signed)
Pre visit review using our clinic review tool, if applicable. No additional management support is needed unless otherwise documented below in the visit note. 

## 2013-09-12 LAB — VITAMIN D 1,25 DIHYDROXY
Vitamin D 1, 25 (OH)2 Total: 52 pg/mL (ref 18–72)
Vitamin D2 1, 25 (OH)2: <8 pg/mL
Vitamin D3 1, 25 (OH)2: 52 pg/mL

## 2013-09-23 NOTE — Telephone Encounter (Signed)
Last seen 06/17/13, told to fu 6 months.

## 2013-09-24 MED ORDER — CYCLOBENZAPRINE HCL 10 MG PO TABS
10 MG | ORAL_TABLET | ORAL | Status: DC
Start: 2013-09-24 — End: 2014-05-19

## 2013-09-27 ENCOUNTER — Ambulatory Visit: Payer: Self-pay

## 2013-09-27 ENCOUNTER — Other Ambulatory Visit: Payer: Self-pay

## 2013-10-06 ENCOUNTER — Other Ambulatory Visit: Payer: Self-pay | Admitting: Internal Medicine

## 2013-10-15 ENCOUNTER — Encounter: Payer: Self-pay | Admitting: Internal Medicine

## 2013-10-15 ENCOUNTER — Ambulatory Visit (INDEPENDENT_AMBULATORY_CARE_PROVIDER_SITE_OTHER): Payer: BC Managed Care – PPO | Admitting: Internal Medicine

## 2013-10-15 VITALS — BP 110/80 | HR 81 | Temp 98.2°F | Wt 137.6 lb

## 2013-10-15 DIAGNOSIS — R05 Cough: Secondary | ICD-10-CM

## 2013-10-15 DIAGNOSIS — J310 Chronic rhinitis: Secondary | ICD-10-CM

## 2013-10-15 DIAGNOSIS — R059 Cough, unspecified: Secondary | ICD-10-CM

## 2013-10-15 MED ORDER — HYDROCODONE-HOMATROPINE 5-1.5 MG/5ML PO SYRP: 5.0000 mL | ORAL_SOLUTION | Freq: Four times a day (QID) | ORAL | Status: DC | PRN

## 2013-10-15 NOTE — Progress Notes (Signed)
   Subjective:    Patient ID: Gabriela Carrillo, female    DOB: 1956/01/22, 58 y.o.   MRN: [id number]  HPI   Her symptoms began 10/07/13 as sore throat, rhinitis, and head congestion. She also had some secretions in the throat; this has persisted . She had a temperature up to 102.513/5 and 3/6. She had significant myalgias and arthralgias last week.  Her husband had a similar illness  She's taken NyQuil as well as nonsteroidals with minimal response  At this time she has some frontal headache; it was severe last night. She is minor sore throat she also complains of cough which is mainly dry.  She did take the flu shot.    Review of Systems  She denies facial pain, dental pain, nasal purulence, otic pain, otic discharge, wheezing, or shortness of breath.  She's had no extrinsic symptoms of itchy, watery eyes. Mild sneezing last week.     Objective:   Physical Exam General appearance:good health ;well nourished; no acute distress or increased work of breathing is present.  No  lymphadenopathy about the head, neck, or axilla noted.   Eyes: No conjunctival inflammation or lid edema is present. There is no scleral icterus.  Ears:  External ear exam shows no significant lesions or deformities.  Otoscopic examination reveals wax bilaterally Nose:  External nasal examination shows no deformity or inflammation. Nasal mucosa are pink and moist without lesions or exudates. No septal dislocation or deviation.No obstruction to airflow.   Oral exam: Dental hygiene is good; lips and gums are healthy appearing.There is no oropharyngeal erythema or exudate noted.   Neck:  No deformities, thyromegaly, masses, or tenderness noted.      Heart:  Normal rate and regular rhythm. S1 and S2 normal without gallop, murmur, click, rub or other extra sounds.   Lungs:Chest clear to auscultation; no wheezes, rhonchi,rales ,or rubs present.No increased work of breathing.  Brassy cough  Extremities:  No cyanosis,  edema, or clubbing  noted    Skin: Warm & dry          Assessment & Plan:  #1 post flu syndrome with residual frontal sinus edema. Tamiflu would not be indicated after this interval and in the absence of fever and significant myalgias/arthralgias.  #2 cough; this is most likely related to postnasal drainage  Plan: See orders

## 2013-10-15 NOTE — Progress Notes (Signed)
Pre visit review using our clinic review tool, if applicable. No additional management support is needed unless otherwise documented below in the visit note. 

## 2013-10-15 NOTE — Patient Instructions (Signed)
Plain Mucinex (NOT D) for thick secretions ;force NON dairy fluids .   Nasal cleansing in the shower as discussed with lather of mild shampoo.After 10 seconds wash off lather while  exhaling through nostrils. Make sure that all residual soap is removed to prevent irritation.  Flonase OR Nasacort AQ 1 spray in each nostril twice a day as needed. Use the "crossover" technique into opposite nostril spraying toward opposite ear @ 45 degree angle, not straight up into nostril.  Use a Neti pot daily only  as needed for significant sinus congestion; going from open side to congested side . Plain Allegra (NOT D )  160 daily , Loratidine 10 mg , OR Zyrtec 10 mg @ bedtime  as needed for itchy eyes & sneezing.  Qvar sample one - two inhalations every 12 hours; gargle and spit after use

## 2013-10-18 ENCOUNTER — Telehealth: Payer: Self-pay | Admitting: Internal Medicine

## 2013-10-18 NOTE — Telephone Encounter (Signed)
   She stated the headache was better with nonsteroidals. She was told to use Eucerin twice a day for drying of nasal mucosa

## 2013-10-18 NOTE — Telephone Encounter (Signed)
Patient states that she has a really bad headache and when she blows her nose she sees blood. Wants to know if Dr. Linna Darner would recommend anything else besides what was already prescribed. Please advise.

## 2013-10-21 ENCOUNTER — Encounter: Payer: Self-pay | Admitting: Internal Medicine

## 2013-10-21 ENCOUNTER — Ambulatory Visit (INDEPENDENT_AMBULATORY_CARE_PROVIDER_SITE_OTHER): Payer: BC Managed Care – PPO | Admitting: Internal Medicine

## 2013-10-21 VITALS — BP 138/110 | HR 70 | Temp 97.9°F | Resp 14 | Wt 137.6 lb

## 2013-10-21 DIAGNOSIS — I10 Essential (primary) hypertension: Secondary | ICD-10-CM

## 2013-10-21 DIAGNOSIS — R51 Headache: Secondary | ICD-10-CM

## 2013-10-21 DIAGNOSIS — R519 Headache, unspecified: Secondary | ICD-10-CM

## 2013-10-21 MED ORDER — LOSARTAN POTASSIUM 100 MG PO TABS: 100.0000 mg | ORAL_TABLET | Freq: Every day | ORAL | Status: DC

## 2013-10-21 MED ORDER — TRAMADOL HCL 50 MG PO TABS: 50.0000 mg | ORAL_TABLET | Freq: Four times a day (QID) | ORAL | Status: DC | PRN

## 2013-10-21 NOTE — Progress Notes (Signed)
   Subjective:    Patient ID: Gabriela Carrillo, female    DOB: [birth year], 58 y.o.   MRN: [id number]  HPI 58 year old female began checking BP Friday because of headache with values ranging from 140/83 to 183/94. Has dull aching right frontal headache during the day but it eases when she is lying down, applies pressure or employs cool compresses. Has tried ibuprofen without relief. States right ear feels stopped up and she has bad post nasal drip that makes her feel nauseous at times.Has clear drainage from nose.   She denies paroxysmal tachycardia or headache.   Review of Systems  Constitutional: Positive for fever (around 99.). Negative for chills and fatigue.  HENT: Positive for congestion (especially on right side.), ear pain (and right ear pressure.), postnasal drip, rhinorrhea (clear drainage) and sinus pressure (above right eye.). Negative for hearing loss, nosebleeds, sneezing and sore throat.  Respiratory: Positive for cough (non-productive.). Negative for chest tightness and shortness of breath.  Gastrointestinal: Positive for loose stool w/o frank diarrhea (last two days.). No constellation of flushing and diarrhea. Neurological: Positive for light-headedness.      Objective:   Physical Exam   She appears well-developed and well-nourished. No distress.  HENT: Wax present in both canals, greater on the right than the left. An attempt was made to remove this with a speculum; this resulted in pain. Head: Normocephalic and atraumatic.  Mouth/Throat: No oropharyngeal exudate.  Eyes: Pupils are equal, round, and reactive to light. No scleral icterus. Field of vision and vision intact. Extraocular motion normal. Slight arteriolar narrowing on fundal exam. Neck: Neck supple. Carotid bruit is not present.  Cardiovascular: Normal rate, regular rhythm and intact distal pulses. Exam reveals no gallop and no friction rub.  No murmur heard.  Pulmonary/Chest: Effort normal and breath sounds  normal.  Abdominal: Soft. Bowel sounds are normal. She exhibits no abdominal bruit and no mass. There is no tenderness.  Lymphadenopathy:  She has no cervical or axillary adenopathy.  Neurological: She is alert and oriented to person, place, and time. No cranial nerve deficit. Strength, tone, deep tendon reflexes are normal. Romberg and finger to nose testing are normal except for slight tremor of her hands. Skin: Skin is warm and dry.  Psychiatric: She is anxious . Her behavior is normal.           Assessment & Plan:  #1 hypertension  #2 headache with no neurologic deficit or localizing signs  See orders

## 2013-10-21 NOTE — Progress Notes (Signed)
Pre visit review using our clinic review tool, if applicable. No additional management support is needed unless otherwise documented below in the visit note. 

## 2013-10-21 NOTE — Patient Instructions (Signed)
Minimal Blood Pressure Goal= AVERAGE < 140/90;  Ideal is an AVERAGE < 135/85. This AVERAGE should be calculated from @ least 5-7 BP readings taken @ different times of day on different days of week. You should not respond to isolated BP readings , but rather the AVERAGE for that week .Please bring your  blood pressure cuff to office visits to verify that it is reliable.It  can also be checked against the blood pressure device at the pharmacy. Finger or wrist cuffs are not dependable; an arm cuff is Please do not use Q-tips as we discussed. Should wax build up occur, please put 2-3 drops of mineral oil in the affected  ear at night to soften the wax .Cover the canal with a  cotton ball to prevent the oil from staining bed linens. In the morning fill the ear canal with hydrogen peroxide & lie in the opposite lateral decubitus position(on the side opposite the affected ear)  for 10-15 minutes. After allowing this period of time for the peroxide to dissolve the wax ;shower and use the thinnest washrag available to wick out the wax. If both ears are involved ; alternate this treatment from ear to ear each night until no wax is found on the washrag.

## 2013-10-21 NOTE — Progress Notes (Signed)
   Subjective:    Patient ID: Gabriela Carrillo, female    DOB: 30-Dec-1955, 58 y.o.   MRN: [id number]  HPI Comments: 58 year old female began checking BP Friday and has been checking since then values ranging from 140/83 to 183/94. Has dull aching right frontal headache during the day but it eases when she is lying down.Marland Kitchen Has tried ibuprofen without relief. States right ear feels stopped up and she has bad post nasal drip that makes her feel nauseous at times.Has clear drainage from nose.   Hypertension Pertinent negatives include no shortness of breath. Headaches: over right eye which eases when supine.      Review of Systems  Constitutional: Positive for fever (around 99.). Negative for chills and fatigue.  HENT: Positive for congestion (especially on right side.), ear pain (and right ear pressure.), postnasal drip, rhinorrhea (clear drainage) and sinus pressure (above right eye.). Negative for hearing loss, nosebleeds, sneezing and sore throat.   Respiratory: Positive for cough (non-productive.). Negative for chest tightness and shortness of breath.   Gastrointestinal: Positive for diarrhea (last two days.).  Neurological: Positive for light-headedness. Headaches: over right eye which eases when supine.       Objective:   Physical Exam  Constitutional: She is oriented to person, place, and time. She appears well-developed and well-nourished. No distress.  HENT:  Head: Normocephalic and atraumatic.  Mouth/Throat: No oropharyngeal exudate.  Eyes: Pupils are equal, round, and reactive to light. No scleral icterus.  Neck: Neck supple. Carotid bruit is not present.  Cardiovascular: Normal rate, regular rhythm and intact distal pulses.  Exam reveals no gallop and no friction rub.   No murmur heard. Pulmonary/Chest: Effort normal and breath sounds normal.  Abdominal: Soft. Bowel sounds are normal. She exhibits no abdominal bruit and no mass. There is no tenderness.  Lymphadenopathy:    She  has no cervical adenopathy.  Neurological: She is alert and oriented to person, place, and time.  Skin: Skin is warm and dry.  Psychiatric: She has a normal mood and affect. Her behavior is normal.          Assessment & Plan:

## 2013-11-21 NOTE — Telephone Encounter (Signed)
Last visit 06/17/13, told to fu in 6 months.  Pt does not have appt scheduled

## 2013-12-05 MED ORDER — MONTELUKAST SODIUM 10 MG PO TABS
10 MG | ORAL_TABLET | ORAL | Status: DC
Start: 2013-12-05 — End: 2015-03-02

## 2013-12-05 MED ORDER — CITALOPRAM HYDROBROMIDE 40 MG PO TABS
40 MG | ORAL_TABLET | ORAL | Status: DC
Start: 2013-12-05 — End: 2014-05-19

## 2013-12-05 MED ORDER — HYDROXYZINE HCL 50 MG PO TABS
50 MG | ORAL_TABLET | ORAL | Status: DC
Start: 2013-12-05 — End: 2014-05-19

## 2013-12-05 MED ORDER — ZOLPIDEM TARTRATE 10 MG PO TABS
10 MG | ORAL_TABLET | Freq: Every evening | ORAL | Status: DC | PRN
Start: 2013-12-05 — End: 2014-05-19

## 2013-12-05 MED ORDER — MELOXICAM 15 MG PO TABS
15 MG | ORAL_TABLET | ORAL | Status: DC
Start: 2013-12-05 — End: 2014-05-19

## 2013-12-05 MED ORDER — LORAZEPAM 1 MG PO TABS
1 MG | ORAL_TABLET | Freq: Four times a day (QID) | ORAL | Status: DC | PRN
Start: 2013-12-05 — End: 2017-02-15

## 2013-12-05 MED ORDER — HYDROCODONE-ACETAMINOPHEN 5-325 MG PO TABS
5-325 MG | ORAL_TABLET | Freq: Four times a day (QID) | ORAL | Status: DC | PRN
Start: 2013-12-05 — End: 2014-05-19

## 2013-12-05 NOTE — Progress Notes (Signed)
Subjective:      Patient ID: Cassandra Copeland is a 58 y.o. female.    HPI   Chief Complaint   Patient presents with   ??? Back Pain     uses hydrocodone for this ; takes 1 pill at night so that she can sleep.  Reviewed O a RRS.     ??? Insomnia     uses zolpidem nightly    ??? Asthma-quiesced and    ??? Anxiety-fear of flying-going out the visit L father, recently had CVA.     ??? Pruritis   ??? Other     she will be flying to [place] in a couple of days so she would like to get something to help her calm her nerves but not knock her out         Review of Systems    Objective:   Physical Exam  BP 122/88    Pulse 73    Ht 5\' 4"  (1.626 m)    Wt 199 lb (90.266 kg)    BMI 34.14 kg/m2      SpO2 98%    Breastfeeding? No     >20 minutes with patient all one on one re illnesses  Assessment:      Cassandra Copeland was seen today for back pain, insomnia, asthma, anxiety, pruritis and other.    Diagnoses and associated orders for this visit:    Fibromyalgia  - hydrOXYzine (ATARAX) 50 MG tablet; TAKE 1 TABLET EVERY 8 HOURS AS NEEDED FOR ITCHING  - meloxicam (MOBIC) 15 MG tablet; TAKE 1 TABLET DAILY    Sciatica  - HYDROcodone-acetaminophen (NORCO) 5-325 MG per tablet; Take 1 tablet by mouth every 6 hours as needed for Pain To be used at night for back pain for sleep. No more than one a day.    Insomnia  - zolpidem (AMBIEN) 10 MG tablet; Take 1 tablet by mouth nightly as needed    Fear of flying  - LORazepam (ATIVAN) 1 MG tablet; Take 1 tablet by mouth every 6 hours as needed for Anxiety    Other Orders  - montelukast (SINGULAIR) 10 MG tablet; TAKE 1 TABLET NIGHTLY  - citalopram (CELEXA) 40 MG tablet; TAKE 1 TABLET DAILY              Plan:      Return in about 6 months (around 06/06/2014).   There are no Patient Instructions on file for this visit.

## 2014-04-08 ENCOUNTER — Other Ambulatory Visit: Payer: Self-pay | Admitting: Internal Medicine

## 2014-04-09 ENCOUNTER — Telehealth: Payer: Self-pay | Admitting: *Deleted

## 2014-04-09 MED ORDER — LOSARTAN POTASSIUM 100 MG PO TABS: 50.0000 mg | ORAL_TABLET | Freq: Every day | ORAL | Status: DC

## 2014-04-09 NOTE — Telephone Encounter (Signed)
Left msg on triage stating received e-script on pt losartan. Need to clarify if pt suppose to take 1/2 or whole pill. Per chart pt suppose to take 1/2 of Losartan 100 mg. Updated script & resent to [facility]...Johny Chess

## 2014-04-11 ENCOUNTER — Other Ambulatory Visit: Payer: Self-pay

## 2014-04-11 ENCOUNTER — Ambulatory Visit
Admission: RE | Admit: 2014-04-11 | Discharge: 2014-04-11 | Disposition: A | Discharge status: Home / Self Care | Payer: BC Managed Care – PPO | Source: Ambulatory Visit

## 2014-04-11 DIAGNOSIS — Z1231 Encounter for screening mammogram for malignant neoplasm of breast: Secondary | ICD-10-CM

## 2014-04-11 DIAGNOSIS — Z853 Personal history of malignant neoplasm of breast: Secondary | ICD-10-CM

## 2014-04-11 MED ORDER — CILIDINIUM-CHLORDIAZEPOXIDE 2.5-5 MG PO CAPS: ORAL_CAPSULE | ORAL | Status: DC

## 2014-05-19 ENCOUNTER — Ambulatory Visit: Admit: 2014-05-19 | Discharge: 2014-05-19 | Payer: PRIVATE HEALTH INSURANCE | Attending: Family Medicine

## 2014-05-19 DIAGNOSIS — Z23 Encounter for immunization: Secondary | ICD-10-CM

## 2014-05-19 MED ORDER — CITALOPRAM HYDROBROMIDE 40 MG PO TABS
40 MG | ORAL_TABLET | ORAL | Status: DC
Start: 2014-05-19 — End: 2015-03-02

## 2014-05-19 MED ORDER — ZOLPIDEM TARTRATE 10 MG PO TABS
10 MG | ORAL_TABLET | Freq: Every evening | ORAL | Status: DC | PRN
Start: 2014-05-19 — End: 2015-03-02

## 2014-05-19 MED ORDER — TETANUS-DIPHTH-ACELL PERTUSSIS 5-2-15.5 LF-MCG/0.5 IM SUSP
Freq: Once | INTRAMUSCULAR | Status: DC
Start: 2014-05-19 — End: 2014-05-19

## 2014-05-19 MED ORDER — HYDROCODONE-ACETAMINOPHEN 5-325 MG PO TABS
5-325 MG | ORAL_TABLET | Freq: Four times a day (QID) | ORAL | Status: DC | PRN
Start: 2014-05-19 — End: 2014-09-17

## 2014-05-19 MED ORDER — HYDROXYZINE HCL 50 MG PO TABS
50 MG | ORAL_TABLET | ORAL | Status: DC
Start: 2014-05-19 — End: 2015-03-02

## 2014-05-19 MED ORDER — CYCLOBENZAPRINE HCL 10 MG PO TABS
10 MG | ORAL_TABLET | ORAL | Status: DC
Start: 2014-05-19 — End: 2015-03-02

## 2014-05-19 MED ORDER — ALBUTEROL SULFATE HFA 108 (90 BASE) MCG/ACT IN AERS
108 (90 Base) MCG/ACT | Freq: Four times a day (QID) | RESPIRATORY_TRACT | Status: DC | PRN
Start: 2014-05-19 — End: 2015-03-02

## 2014-05-19 MED ORDER — MELOXICAM 15 MG PO TABS
15 MG | ORAL_TABLET | ORAL | Status: DC
Start: 2014-05-19 — End: 2015-03-02

## 2014-05-19 NOTE — Patient Instructions (Signed)
On next visit, we will want to get fasting blood work

## 2014-05-19 NOTE — Progress Notes (Signed)
Subjective:      Patient ID: Cassandra Copeland is a 58 y.o. female.    HPI   Chief Complaint   Patient presents with   ??? Back Pain     Fibromyalgia primarily expressed itself is back pain; recent flare with stress in life-mother either ill or recently died   ??? Insomnia     See above   ??? Anxiety     Ditto   ??? Medication Refill   ??? Other     when she start pneumonia vaccines?  Discussed         Review of Systems   Gastrointestinal: Negative.    Genitourinary: Negative.    Neurological: Negative.        Objective:   Physical Exam   Constitutional: She appears well-developed and well-nourished. No distress.   Cardiovascular: Normal rate, regular rhythm and normal heart sounds.    Pulmonary/Chest: Effort normal and breath sounds normal.   Musculoskeletal: She exhibits no edema.   Nursing note and vitals reviewed.   BP 136/80 mmHg   Pulse 68   Temp(Src) 97.4 ??F (36.3 ??C) (Oral)   Resp 12   Ht 5\' 4"  (1.626 m)   Wt 197 lb (89.359 kg)   BMI 33.80 kg/m2  Controlled Substances Monitoring: Attestation: The Prescription Monitoring Report for this patient was reviewed today. Everardo Beals(Daequan Kozma F Ayse Mccartin, MD)  Documentation: Possible medication side effects, risk of tolerance and/or dependence, and alternative treatments discussed, No signs of potential drug abuse or diversion identified. Everardo Beals(Jenner Rosier F Ajla Mcgeachy, MD)      Assessment:      Cassandra Copeland was seen today for back pain, insomnia, anxiety, medication refill and other.    Diagnoses and associated orders for this visit:    Needs flu shot  - Flu Vaccine greater than or = 59yo IM    Fibromyalgia  - hydrOXYzine (ATARAX) 50 MG tablet; TAKE 1 TABLET EVERY 8 HOURS AS NEEDED FOR ITCHING  - HYDROcodone-acetaminophen (NORCO) 5-325 MG per tablet; Take 1 tablet by mouth every 6 hours as needed for Pain To be used at night for back pain for sleep. No more than one a day.  - citalopram (CELEXA) 40 MG tablet; TAKE 1 TABLET DAILY  - meloxicam (MOBIC) 15 MG tablet; TAKE 1 TABLET DAILY  - cyclobenzaprine  (FLEXERIL) 10 MG tablet; TAKE 1 TABLET THREE TIMES A DAY AS NEEDED FOR MUSCLE SPASMS    Need for Tdap vaccination  - Discontinue: Tdap (ADACEL) 12-07-13.5 LF-MCG/0.5 injection; Inject 0.5 mLs into the muscle once for 1 dose  - Tdap vaccine greater than or equal to 7yo IM (ADACEL;BOOSTRIX)    Other Orders  - albuterol (PROVENTIL HFA;VENTOLIN HFA) 108 (90 BASE) MCG/ACT inhaler; Inhale 2 puffs into the lungs every 6 hours as needed for Wheezing  - zolpidem (AMBIEN) 10 MG tablet; Take 1 tablet by mouth nightly as needed              Plan:      Return in about 6 months (around 11/18/2014).  Patient Instructions   On next visit, we will want to get fasting blood work

## 2014-06-06 NOTE — Telephone Encounter (Signed)
OTC Robitussin-DM

## 2014-06-06 NOTE — Telephone Encounter (Signed)
Patient notified

## 2014-06-06 NOTE — Telephone Encounter (Signed)
I left message on answering machine for patient to call office at [phone] Monday through Friday, 8:00  a.m. To 5:30 pm.

## 2014-06-06 NOTE — Telephone Encounter (Signed)
Patient says that she is having sinus drainage with cough. Asking if dr. Salome Spottedspaccarelli would prescribe something for cough and send to [facility] on [address].

## 2014-07-07 ENCOUNTER — Ambulatory Visit (INDEPENDENT_AMBULATORY_CARE_PROVIDER_SITE_OTHER): Payer: 59 | Admitting: Internal Medicine

## 2014-07-07 ENCOUNTER — Encounter: Payer: Self-pay | Admitting: Internal Medicine

## 2014-07-07 VITALS — BP 160/96 | HR 79 | Temp 98.7°F | Wt 143.2 lb

## 2014-07-07 DIAGNOSIS — L299 Pruritus, unspecified: Secondary | ICD-10-CM

## 2014-07-07 NOTE — Patient Instructions (Signed)
Dip gauze in  sterile saline and applied to the area twice a day. Cover the wound with Telfa , non stick dressing  without any antibiotic ointment. The saline can be purchased at the drugstore or you can make your own .Boil cup of salt in a gallon of water. Store mixture  in a clean container. Report Warning  signs as discussed (red streaks, pus, fever, increasing pain). Apply Cort Aid OTC twice a day to the involved tissues if itching.

## 2014-07-07 NOTE — Progress Notes (Signed)
   Subjective:    Patient ID: Gabriela Carrillo, female    DOB: 1956-05-05, 58 y.o.   MRN: [id number]  HPI   After working in the yard decorating 11/28 she noticed some itching over the right shin.  She did apply Neosporin and there was  Progressive redness & itching.  She's had some nausea but no abdominal pain or nausea/ vomiting. She denies other constitutional symptoms.    Review of Systems  No associated itchy, watery eyes.  Swelling of the lips or tongue denied.  Shortness of breath, wheezing, or cough absent.  No rash or urticaria noted.  Fever ,chills , or sweats denied. Purulence absent.  Diarrhea not present.     Objective:   Physical Exam   Positive or pertinent findings include: There is a 1 x 1.5 cm triangular-shaped erythematous lesion over the right medial shin. There is a central papule without pustule formation. There is no clinical evidence of cellulitis Pedal pulses are intact. Skin appears healthy otherwise without ischemic changes.  General appearance :adequately nourished; in no distress. Eyes: No conjunctival inflammation or scleral icterus is present. Oral exam: Dental hygiene is good. Lips and gums are healthy appearing.There is no oropharyngeal erythema or exudate noted.  Heart:  Normal rate and regular rhythm. S1 and S2 normal without gallop, murmur, click, rub or other extra sounds   Lungs:Chest clear to auscultation; no wheezes, rhonchi,rales ,or rubs present.No increased work of breathing.  Abdomen: bowel sounds normal, soft and non-tender without masses, organomegaly or hernias noted.  No guarding or rebound.  Vascular : all pulses equal ; no bruits present. Lymphatic: No lymphadenopathy is noted about the head, neck, axilla           Assessment & Plan:  #1 pruritic dermatitis; possible component of contact dermatitis from Neosporin  Plan: See after visit summary

## 2014-07-07 NOTE — Progress Notes (Signed)
Pre visit review using our clinic review tool, if applicable. No additional management support is needed unless otherwise documented below in the visit note. 

## 2014-09-17 ENCOUNTER — Ambulatory Visit: Admit: 2014-09-17 | Discharge: 2014-09-17 | Payer: PRIVATE HEALTH INSURANCE | Attending: Family Medicine

## 2014-09-17 DIAGNOSIS — J0100 Acute maxillary sinusitis, unspecified: Secondary | ICD-10-CM

## 2014-09-17 MED ORDER — PROMETHAZINE-CODEINE 6.25-10 MG/5ML PO SYRP
Freq: Four times a day (QID) | ORAL | Status: DC | PRN
Start: 2014-09-17 — End: 2015-03-02

## 2014-09-17 MED ORDER — HYDROCODONE-ACETAMINOPHEN 5-325 MG PO TABS
5-325 MG | ORAL_TABLET | Freq: Four times a day (QID) | ORAL | Status: DC | PRN
Start: 2014-09-17 — End: 2015-03-02

## 2014-09-17 MED ORDER — AZITHROMYCIN 250 MG PO TABS
250 MG | PACK | ORAL | Status: DC
Start: 2014-09-17 — End: 2015-03-02

## 2014-09-17 NOTE — Progress Notes (Signed)
Subjective:      Patient ID: Cassandra Copeland is a 59 y.o. female.    HPI   Chief Complaint   Patient presents with   ??? Cough     productive brownish discharge x 3 days   ??? Sinus Problem   ??? Headache   ??? Ear Fullness     Chief complaint and present illness: 59 year old white female presents unaccompanied with 3 day history of bilateral temporal and maxillary facial discomfort associated with clear rhinorrhea and a cough productive of brown sputum.  Associated with this has been some dysphonia.  No fever no vomiting.  Contact-60 year old boy who lives in her home.  Patient did get the flu vaccine this year.  Denies antibiotics or travel.  Does complain of a postnasal drip.     Review of Systems   Constitutional: Positive for fatigue. Negative for fever, activity change, appetite change and unexpected weight change.   HENT: Positive for congestion, postnasal drip, sore throat and voice change. Negative for dental problem, ear discharge, facial swelling, hearing loss, nosebleeds, rhinorrhea, sinus pressure, sneezing, tinnitus and trouble swallowing.    Eyes: Negative.    Respiratory: Positive for cough. Negative for shortness of breath.    Cardiovascular: Negative.    Gastrointestinal: Negative.    Musculoskeletal: Positive for arthralgias.   Skin: Negative.        Objective:   Physical Exam   Constitutional: She appears well-developed and well-nourished.   HENT:   Head: Normocephalic.   Right Ear: Tympanic membrane and external ear normal.   Left Ear: Tympanic membrane and external ear normal.   Nose: Mucosal edema present. Right sinus exhibits no maxillary sinus tenderness and no frontal sinus tenderness. Left sinus exhibits no maxillary sinus tenderness and no frontal sinus tenderness.   Mouth/Throat: Oropharynx is clear and moist.   Eyes: Conjunctivae are normal.   Neck: Neck supple.   Cardiovascular: Normal rate, regular rhythm and normal heart sounds.    Pulmonary/Chest: Breath sounds normal. She has no wheezes. She  has no rales.   Abdominal: Soft. Bowel sounds are normal. She exhibits no mass.   Lymphadenopathy:     She has no cervical adenopathy.   Nursing note and vitals reviewed.   BP 126/88 mmHg   Pulse 115   Temp(Src) 97.4 ??F (36.3 ??C) (Oral)   Resp 20   Ht 5\' 4"  (1.626 m)   Wt 201 lb (91.173 kg)   BMI 34.48 kg/m2   SpO2 98%      Assessment:      Cassandra Copeland was seen today for cough, sinus problem, headache and ear fullness.    Diagnoses and associated orders for this visit:    Acute maxillary sinusitis, recurrence not specified  - azithromycin (ZITHROMAX) 250 MG tablet; Take 2 tabs (500 mg) on Day 1, and take 1 tab (250 mg) on days 2 through 5.  - promethazine-codeine (PHENERGAN WITH CODEINE) 6.25-10 MG/5ML syrup; Take 5 mLs by mouth every 6 hours as needed for Cough    Fibromyalgia  - HYDROcodone-acetaminophen (NORCO) 5-325 MG per tablet; Take 1 tablet by mouth every 6 hours as needed for Pain To be used at night for back pain for sleep. No more than one a day.              Plan:      Return if symptoms worsen or fail to improve.  Patient Instructions   Use the inhaler while coughing

## 2014-09-17 NOTE — Patient Instructions (Signed)
Use the inhaler while coughing

## 2014-09-17 NOTE — Telephone Encounter (Signed)
Controlled Substances Monitoring: Attestation: The Prescription Monitoring Report for this patient was reviewed today. (Zykera Abella, MA)

## 2014-10-23 NOTE — Telephone Encounter (Signed)
Last seen 05/19/14, told to fu in 6 months.  No appt on file

## 2014-10-23 NOTE — Telephone Encounter (Signed)
Patient notified, she stated she did not need a refill anyways.  The pharmacy sent it by mistake

## 2014-10-24 ENCOUNTER — Telehealth: Payer: Self-pay | Admitting: Internal Medicine

## 2014-10-24 MED ORDER — FLUOXETINE HCL 20 MG PO CAPS: 20.0000 mg | ORAL_CAPSULE | Freq: Every day | ORAL | Status: DC

## 2014-10-24 NOTE — Telephone Encounter (Signed)
Last seen 05/19/2014 advised to return in 6 months

## 2014-10-24 NOTE — Telephone Encounter (Signed)
Patient notified she needs to schedule an appointment

## 2014-10-24 NOTE — Telephone Encounter (Signed)
Sent 30 day only pt is due for yearly physical.../lmb

## 2014-10-24 NOTE — Telephone Encounter (Signed)
Pt request refill for FLUoxetine (PROZAC) 20 MG to be send to [facility]. Please help she is trying to get this refill since last week.

## 2014-10-27 ENCOUNTER — Other Ambulatory Visit: Payer: Self-pay

## 2014-10-27 NOTE — Telephone Encounter (Signed)
Opened in error

## 2014-11-06 ENCOUNTER — Other Ambulatory Visit: Payer: Self-pay | Admitting: Gynecology

## 2014-11-07 LAB — CYTOLOGY - PAP

## 2014-11-24 ENCOUNTER — Other Ambulatory Visit: Payer: Self-pay | Admitting: Internal Medicine

## 2014-11-24 NOTE — Telephone Encounter (Signed)
OK X 3 mos 

## 2014-12-08 ENCOUNTER — Other Ambulatory Visit (HOSPITAL_COMMUNITY): Payer: Self-pay | Admitting: Plastic Surgery

## 2014-12-09 NOTE — Pre-Procedure Instructions (Signed)
Chanon Loney  12/09/2014   Your procedure is scheduled on:  Thurs, May 12 @ 12:00 PM  Report to [facility]  at 10:00 AM.  Call this number if you have problems the morning of surgery: [phone]   Remember:   Do not eat food or drink liquids after midnight.   Take these medicines the morning of surgery with A SIP OF WATER: Prozac(Fluoxetine)               No Goody's,BC's,Aleve,Aspirin,Ibuprofen,Fish Oil,or any Herbal Medications.    Do not wear jewelry, make-up or nail polish.  Do not wear lotions, powders, or perfumes.   Do not shave 48 hours prior to surgery.   Do not bring valuables to the hospital.  [facility] is not responsible                  for any belongings or valuables.               Contacts, dentures or bridgework may not be worn into surgery.  Leave suitcase in the car. After surgery it may be brought to your room.  For patients admitted to the hospital, discharge time is determined by your                treatment team.                 Special Instructions:  [place] - Preparing for Surgery  Before surgery, you can play an important role.  Because skin is not sterile, your skin needs to be as free of germs as possible.  You can reduce the number of germs on you skin by washing with CHG (chlorahexidine gluconate) soap before surgery.  CHG is an antiseptic cleaner which kills germs and bonds with the skin to continue killing germs even after washing.  Please DO NOT use if you have an allergy to CHG or antibacterial soaps.  If your skin becomes reddened/irritated stop using the CHG and inform your nurse when you arrive at Short Stay.  Do not shave (including legs and underarms) for at least 48 hours prior to the first CHG shower.  You may shave your face.  Please follow these instructions carefully:   1.  Shower with CHG Soap the night before surgery and the                                morning of Surgery.  2.  If you choose to wash your hair, wash  your hair first as usual with your       normal shampoo.  3.  After you shampoo, rinse your hair and body thoroughly to remove the                      Shampoo.  4.  Use CHG as you would any other liquid soap.  You can apply chg directly       to the skin and wash gently with scrungie or a clean washcloth.  5.  Apply the CHG Soap to your body ONLY FROM THE NECK DOWN.        Do not use on open wounds or open sores.  Avoid contact with your eyes,       ears, mouth and genitals (private parts).  Wash genitals (private parts)       with your normal soap.  6.  Wash thoroughly, paying special attention to the area where your surgery        will be performed.  7.  Thoroughly rinse your body with warm water from the neck down.  8.  DO NOT shower/wash with your normal soap after using and rinsing off       the CHG Soap.  9.  Pat yourself dry with a clean towel.            10.  Wear clean pajamas.            11.  Place clean sheets on your bed the night of your first shower and do not        sleep with pets.  Day of Surgery  Do not apply any lotions/deoderants the morning of surgery.  Please wear clean clothes to the hospital/surgery center.    Please read over the following fact sheets that you were given: Pain Booklet, Coughing and Deep Breathing and Surgical Site Infection Prevention

## 2014-12-10 ENCOUNTER — Encounter (HOSPITAL_COMMUNITY)
Admission: RE | Admit: 2014-12-10 | Discharge: 2014-12-10 | Disposition: A | Discharge status: Home / Self Care | Payer: 59 | Source: Ambulatory Visit | Attending: Plastic Surgery | Admitting: Plastic Surgery

## 2014-12-10 ENCOUNTER — Encounter (HOSPITAL_COMMUNITY): Payer: Self-pay

## 2014-12-10 DIAGNOSIS — M8580 Other specified disorders of bone density and structure, unspecified site: Secondary | ICD-10-CM | POA: Diagnosis not present

## 2014-12-10 DIAGNOSIS — Z9012 Acquired absence of left breast and nipple: Secondary | ICD-10-CM | POA: Diagnosis present

## 2014-12-10 DIAGNOSIS — F329 Major depressive disorder, single episode, unspecified: Secondary | ICD-10-CM | POA: Diagnosis not present

## 2014-12-10 DIAGNOSIS — Z882 Allergy status to sulfonamides status: Secondary | ICD-10-CM | POA: Diagnosis not present

## 2014-12-10 DIAGNOSIS — E876 Hypokalemia: Secondary | ICD-10-CM | POA: Diagnosis not present

## 2014-12-10 DIAGNOSIS — I10 Essential (primary) hypertension: Secondary | ICD-10-CM | POA: Diagnosis not present

## 2014-12-10 DIAGNOSIS — F419 Anxiety disorder, unspecified: Secondary | ICD-10-CM | POA: Diagnosis not present

## 2014-12-10 DIAGNOSIS — Z853 Personal history of malignant neoplasm of breast: Secondary | ICD-10-CM | POA: Diagnosis not present

## 2014-12-10 LAB — BASIC METABOLIC PANEL
ANION GAP: 12 (ref 5–15)
BUN: 6 mg/dL (ref 6–20)
CALCIUM: 9.2 mg/dL (ref 8.9–10.3)
CO2: 26 mmol/L (ref 22–32)
Chloride: 104 mmol/L (ref 101–111)
Creatinine, Ser: 0.63 mg/dL (ref 0.44–1.00)
GFR calc Af Amer: >60 mL/min (ref >60)
GFR calc non Af Amer: >60 mL/min (ref >60)
Glucose, Bld: 86 mg/dL (ref 70–99)
Potassium: 3.7 mmol/L (ref 3.5–5.1)
SODIUM: 142 mmol/L (ref 135–145)

## 2014-12-10 LAB — CBC
HCT: 43.8 % (ref 36.0–46.0)
Hemoglobin: 15.5 g/dL — ABNORMAL HIGH (ref 12.0–15.0)
MCH: 31.6 pg (ref 26.0–34.0)
MCHC: 35.4 g/dL (ref 30.0–36.0)
MCV: 89.4 fL (ref 78.0–100.0)
PLATELETS: 233 10*3/uL (ref 150–400)
RBC: 4.9 MIL/uL (ref 3.87–5.11)
RDW: 12.2 % (ref 11.5–15.5)
WBC: 6.6 10*3/uL (ref 4.0–10.5)

## 2014-12-11 NOTE — Progress Notes (Signed)
Anesthesia Chart Review:  Pt is 59 year old female scheduled for delayed L breast reconstruction with placement of implant on 12/18/2014 with Dr. Harlow Mares.   PMH include: HTN, hypokalemia, breast cancer. Never smoker. BMI 59.   Medications include: losartan  Preoperative labs reviewed.    EKG: NSR. Minimal voltage criteria for LVH, may be normal variant. Nonspecific ST and T wave abnormality. No significant change since 2004.   Echo 12/10/2002: - Overall left ventricular systolic function was normal. Left ventricular ejection fraction was estimated, range being 55% to 65%. Although no diagnostic left ventricular regional wall motion abnormality was identified, this possibility cannot be completely excluded on the basis of this study.  If no changes, I anticipate pt can proceed with surgery as scheduled.   Willeen Cass, FNP-BC [facility]/Anesthesiology Phone: [phone] 12/11/2014 2:25 PM

## 2014-12-17 MED ORDER — HEPARIN SODIUM (PORCINE) 5000 UNIT/ML IJ SOLN
5000.0000 [IU] | INTRAMUSCULAR | Status: AC
  Administered 2014-12-18: 5000 [IU] via SUBCUTANEOUS
  Filled 2014-12-17: qty 1

## 2014-12-17 MED ORDER — CEFAZOLIN SODIUM-DEXTROSE 2-3 GM-% IV SOLR
2.0000 g | INTRAVENOUS | Status: AC
  Administered 2014-12-18: 2 g via INTRAVENOUS
  Filled 2014-12-17: qty 50

## 2014-12-17 NOTE — Progress Notes (Signed)
Patient to be here at 230

## 2014-12-18 ENCOUNTER — Ambulatory Visit (HOSPITAL_COMMUNITY)
Admission: RE | Admit: 2014-12-18 | Discharge: 2014-12-18 | Disposition: A | Discharge status: Home / Self Care | Payer: 59 | Source: Ambulatory Visit | Attending: Plastic Surgery | Admitting: Plastic Surgery

## 2014-12-18 ENCOUNTER — Encounter (HOSPITAL_COMMUNITY): Payer: Self-pay | Admitting: Critical Care Medicine

## 2014-12-18 ENCOUNTER — Ambulatory Visit (HOSPITAL_COMMUNITY): Payer: 59 | Admitting: Emergency Medicine

## 2014-12-18 ENCOUNTER — Encounter (HOSPITAL_COMMUNITY)
Admission: RE | Disposition: A | Discharge status: Home / Self Care | Payer: Self-pay | Source: Ambulatory Visit | Attending: Plastic Surgery

## 2014-12-18 ENCOUNTER — Ambulatory Visit (HOSPITAL_COMMUNITY): Payer: 59 | Admitting: Critical Care Medicine

## 2014-12-18 DIAGNOSIS — Z9012 Acquired absence of left breast and nipple: Secondary | ICD-10-CM | POA: Diagnosis not present

## 2014-12-18 DIAGNOSIS — F419 Anxiety disorder, unspecified: Secondary | ICD-10-CM | POA: Insufficient documentation

## 2014-12-18 DIAGNOSIS — Z882 Allergy status to sulfonamides status: Secondary | ICD-10-CM | POA: Insufficient documentation

## 2014-12-18 DIAGNOSIS — M8580 Other specified disorders of bone density and structure, unspecified site: Secondary | ICD-10-CM | POA: Insufficient documentation

## 2014-12-18 DIAGNOSIS — I10 Essential (primary) hypertension: Secondary | ICD-10-CM | POA: Insufficient documentation

## 2014-12-18 DIAGNOSIS — Z853 Personal history of malignant neoplasm of breast: Secondary | ICD-10-CM | POA: Insufficient documentation

## 2014-12-18 DIAGNOSIS — E876 Hypokalemia: Secondary | ICD-10-CM | POA: Insufficient documentation

## 2014-12-18 DIAGNOSIS — F329 Major depressive disorder, single episode, unspecified: Secondary | ICD-10-CM | POA: Insufficient documentation

## 2014-12-18 HISTORY — DX: Essential (primary) hypertension: I10

## 2014-12-18 HISTORY — PX: BREAST RECONSTRUCTION: SHX9

## 2014-12-18 SURGERY — RECONSTRUCTION, BREAST
Anesthesia: General | Site: Breast | Laterality: Left

## 2014-12-18 MED ORDER — SODIUM CHLORIDE 0.9 % IV SOLN
INTRAVENOUS | Status: AC
  Administered 2014-12-18: 1000 mL
  Filled 2014-12-18: qty 1

## 2014-12-18 MED ORDER — PROPOFOL 10 MG/ML IV BOLUS
INTRAVENOUS | Status: DC | PRN
  Administered 2014-12-18: 150 mg via INTRAVENOUS

## 2014-12-18 MED ORDER — METHOCARBAMOL 500 MG PO TABS
500.0000 mg | ORAL_TABLET | Freq: Four times a day (QID) | ORAL | Status: DC | PRN
  Administered 2014-12-18: 500 mg via ORAL
  Filled 2014-12-18: qty 1

## 2014-12-18 MED ORDER — EPHEDRINE SULFATE 50 MG/ML IJ SOLN
INTRAMUSCULAR | Status: DC | PRN
  Administered 2014-12-18 (×2): 5 mg via INTRAVENOUS

## 2014-12-18 MED ORDER — ROCURONIUM BROMIDE 100 MG/10ML IV SOLN
INTRAVENOUS | Status: DC | PRN
  Administered 2014-12-18: 10 mg via INTRAVENOUS
  Administered 2014-12-18: 30 mg via INTRAVENOUS

## 2014-12-18 MED ORDER — METHOCARBAMOL 500 MG PO TABS: 500.0000 mg | ORAL_TABLET | Freq: Four times a day (QID) | ORAL | Status: DC | PRN

## 2014-12-18 MED ORDER — SODIUM CHLORIDE 0.9 % IJ SOLN
INTRAMUSCULAR | Status: AC
  Filled 2014-12-18: qty 10

## 2014-12-18 MED ORDER — METHOCARBAMOL 500 MG PO TABS
ORAL_TABLET | ORAL | Status: AC
  Administered 2014-12-18: 500 mg via ORAL
  Filled 2014-12-18: qty 1

## 2014-12-18 MED ORDER — HYDROMORPHONE HCL 1 MG/ML IJ SOLN
0.2500 mg | INTRAMUSCULAR | Status: DC | PRN
  Administered 2014-12-18 (×2): 0.5 mg via INTRAVENOUS

## 2014-12-18 MED ORDER — OXYCODONE HCL 5 MG/5ML PO SOLN: 5.0000 mg | Freq: Once | ORAL | Status: AC | PRN

## 2014-12-18 MED ORDER — FENTANYL CITRATE (PF) 100 MCG/2ML IJ SOLN
INTRAMUSCULAR | Status: DC | PRN
  Administered 2014-12-18: 100 ug via INTRAVENOUS
  Administered 2014-12-18 (×2): 25 ug via INTRAVENOUS
  Administered 2014-12-18: 50 ug via INTRAVENOUS

## 2014-12-18 MED ORDER — CEPHALEXIN 500 MG PO CAPS: 500.0000 mg | ORAL_CAPSULE | Freq: Four times a day (QID) | ORAL | Status: DC

## 2014-12-18 MED ORDER — FENTANYL CITRATE (PF) 250 MCG/5ML IJ SOLN
INTRAMUSCULAR | Status: AC
  Filled 2014-12-18: qty 5

## 2014-12-18 MED ORDER — NEOSTIGMINE METHYLSULFATE 10 MG/10ML IV SOLN
INTRAVENOUS | Status: DC | PRN
  Administered 2014-12-18: 4 mg via INTRAVENOUS

## 2014-12-18 MED ORDER — HYDROMORPHONE HCL 1 MG/ML IJ SOLN
INTRAMUSCULAR | Status: AC
  Administered 2014-12-18: 0.5 mg via INTRAVENOUS
  Filled 2014-12-18: qty 1

## 2014-12-18 MED ORDER — GLYCOPYRROLATE 0.2 MG/ML IJ SOLN
INTRAMUSCULAR | Status: AC
  Filled 2014-12-18: qty 2

## 2014-12-18 MED ORDER — ONDANSETRON HCL 4 MG/2ML IJ SOLN
INTRAMUSCULAR | Status: AC
  Filled 2014-12-18: qty 2

## 2014-12-18 MED ORDER — LACTATED RINGERS IV SOLN
INTRAVENOUS | Status: DC
  Administered 2014-12-18 (×2): via INTRAVENOUS

## 2014-12-18 MED ORDER — ONDANSETRON HCL 4 MG/2ML IJ SOLN
INTRAMUSCULAR | Status: DC | PRN
  Administered 2014-12-18: 4 mg via INTRAVENOUS

## 2014-12-18 MED ORDER — OXYCODONE HCL 5 MG PO TABS
5.0000 mg | ORAL_TABLET | Freq: Once | ORAL | Status: AC | PRN
  Administered 2014-12-18: 5 mg via ORAL

## 2014-12-18 MED ORDER — LIDOCAINE HCL (CARDIAC) 20 MG/ML IV SOLN
INTRAVENOUS | Status: DC | PRN
  Administered 2014-12-18: 60 mg via INTRAVENOUS

## 2014-12-18 MED ORDER — GLYCOPYRROLATE 0.2 MG/ML IJ SOLN
INTRAMUSCULAR | Status: DC | PRN
Start: 2014-12-18 — End: 2014-12-18
  Administered 2014-12-18: 0.6 mg via INTRAVENOUS

## 2014-12-18 MED ORDER — HYDROMORPHONE HCL 2 MG PO TABS: 2.0000 mg | ORAL_TABLET | ORAL | Status: DC | PRN

## 2014-12-18 MED ORDER — 0.9 % SODIUM CHLORIDE (POUR BTL) OPTIME
TOPICAL | Status: DC | PRN
  Administered 2014-12-18 (×2): 1000 mL

## 2014-12-18 MED ORDER — MIDAZOLAM HCL 2 MG/2ML IJ SOLN
INTRAMUSCULAR | Status: AC
  Filled 2014-12-18: qty 2

## 2014-12-18 MED ORDER — PROMETHAZINE HCL 25 MG/ML IJ SOLN: 6.2500 mg | INTRAMUSCULAR | Status: DC | PRN

## 2014-12-18 MED ORDER — EPHEDRINE SULFATE 50 MG/ML IJ SOLN
INTRAMUSCULAR | Status: AC
  Filled 2014-12-18: qty 1

## 2014-12-18 MED ORDER — OXYCODONE HCL 5 MG PO TABS
ORAL_TABLET | ORAL | Status: AC
Start: 2014-12-18 — End: 2014-12-18
  Administered 2014-12-18: 5 mg via ORAL
  Filled 2014-12-18: qty 1

## 2014-12-18 MED ORDER — DEXAMETHASONE SODIUM PHOSPHATE 4 MG/ML IJ SOLN
INTRAMUSCULAR | Status: DC | PRN
  Administered 2014-12-18: 4 mg via INTRAVENOUS

## 2014-12-18 MED ORDER — ARTIFICIAL TEARS OP OINT
TOPICAL_OINTMENT | OPHTHALMIC | Status: AC
  Filled 2014-12-18: qty 3.5

## 2014-12-18 MED ORDER — MIDAZOLAM HCL 5 MG/5ML IJ SOLN
INTRAMUSCULAR | Status: DC | PRN
  Administered 2014-12-18: 2 mg via INTRAVENOUS

## 2014-12-18 SURGICAL SUPPLY — 47 items
ADH SKN CLS APL DERMABOND .7 (GAUZE/BANDAGES/DRESSINGS) ×1
BAG DECANTER FOR FLEXI CONT (MISCELLANEOUS) ×2 IMPLANT
BINDER BREAST MEDIUM (GAUZE/BANDAGES/DRESSINGS) ×2 IMPLANT
CANISTER SUCTION 2500CC (MISCELLANEOUS) ×2 IMPLANT
CHLORAPREP W/TINT 26ML (MISCELLANEOUS) ×2 IMPLANT
COVER SURGICAL LIGHT HANDLE (MISCELLANEOUS) ×2 IMPLANT
DERMABOND ADVANCED (GAUZE/BANDAGES/DRESSINGS) ×1
DERMABOND ADVANCED .7 DNX12 (GAUZE/BANDAGES/DRESSINGS) ×1 IMPLANT
DRAPE ORTHO SPLIT 77X108 STRL (DRAPES) ×4
DRAPE PROXIMA HALF (DRAPES) ×6 IMPLANT
DRAPE SURG 17X23 STRL (DRAPES) ×4 IMPLANT
DRAPE SURG ORHT 6 SPLT 77X108 (DRAPES) ×2 IMPLANT
DRAPE WARM FLUID 44X44 (DRAPE) ×2 IMPLANT
DRSG PAD ABDOMINAL 8X10 ST (GAUZE/BANDAGES/DRESSINGS) ×4 IMPLANT
DRSG TEGADERM 2-3/8X2-3/4 SM (GAUZE/BANDAGES/DRESSINGS) ×4 IMPLANT
ELECT BLADE 6.5 EXT (BLADE) ×2 IMPLANT
ELECT CAUTERY BLADE 6.4 (BLADE) ×2 IMPLANT
ELECT REM PT RETURN 9FT ADLT (ELECTROSURGICAL) ×2
ELECTRODE REM PT RTRN 9FT ADLT (ELECTROSURGICAL) ×1 IMPLANT
GLOVE BIO SURGEON STRL SZ7.5 (GLOVE) ×2 IMPLANT
GLOVE BIOGEL PI IND STRL 7.0 (GLOVE) ×1 IMPLANT
GLOVE BIOGEL PI IND STRL 8 (GLOVE) ×1 IMPLANT
GLOVE BIOGEL PI INDICATOR 7.0 (GLOVE) ×1
GLOVE BIOGEL PI INDICATOR 8 (GLOVE) ×1
GLOVE ECLIPSE 7.0 STRL STRAW (GLOVE) ×2 IMPLANT
GLOVE ECLIPSE 7.5 STRL STRAW (GLOVE) ×2 IMPLANT
GLOVE ECLIPSE 8.0 STRL XLNG CF (GLOVE) ×1 IMPLANT
GOWN STRL REUS W/ TWL LRG LVL3 (GOWN DISPOSABLE) ×1 IMPLANT
GOWN STRL REUS W/ TWL XL LVL3 (GOWN DISPOSABLE) ×1 IMPLANT
GOWN STRL REUS W/TWL LRG LVL3 (GOWN DISPOSABLE) ×4
GOWN STRL REUS W/TWL XL LVL3 (GOWN DISPOSABLE) ×2
IMPLANT BREAST GEL 170CC (Breast) ×1 IMPLANT
KIT BASIN OR (CUSTOM PROCEDURE TRAY) ×2 IMPLANT
KIT ROOM TURNOVER OR (KITS) ×2 IMPLANT
MARKER SKIN DUAL TIP RULER LAB (MISCELLANEOUS) ×2 IMPLANT
NS IRRIG 1000ML POUR BTL (IV SOLUTION) ×4 IMPLANT
PACK GENERAL/GYN (CUSTOM PROCEDURE TRAY) ×2 IMPLANT
PAD ARMBOARD 7.5X6 YLW CONV (MISCELLANEOUS) ×2 IMPLANT
SPONGE GAUZE 4X4 12PLY STER LF (GAUZE/BANDAGES/DRESSINGS) ×2 IMPLANT
STAPLER VISISTAT 35W (STAPLE) ×1 IMPLANT
STRIP CLOSURE SKIN 1/2X4 (GAUZE/BANDAGES/DRESSINGS) ×2 IMPLANT
SUT MNCRL AB 3-0 PS2 18 (SUTURE) ×3 IMPLANT
SUT PROLENE 3 0 PS 2 (SUTURE) ×2 IMPLANT
SUT VIC AB 3-0 SH 18 (SUTURE) ×2 IMPLANT
SYR BULB IRRIGATION 50ML (SYRINGE) ×2 IMPLANT
TOWEL OR 17X26 10 PK STRL BLUE (TOWEL DISPOSABLE) ×2 IMPLANT
TUBE CONNECTING 12X1/4 (SUCTIONS) ×2 IMPLANT

## 2014-12-18 NOTE — Anesthesia Preprocedure Evaluation (Addendum)
Anesthesia Evaluation  Patient identified by MRN, date of birth, ID band Patient awake    Reviewed: Allergy & Precautions, NPO status , Patient's Chart, lab work & pertinent test results  Airway Mallampati: I  TM Distance: >3 FB Neck ROM: Full    Dental  (+) Dental Advisory Given   Pulmonary neg pulmonary ROS,  breath sounds clear to auscultation        Cardiovascular hypertension, Pt. on medications Rhythm:Regular Rate:Normal     Neuro/Psych Anxiety Depression negative neurological ROS     GI/Hepatic negative GI ROS, Neg liver ROS,   Endo/Other  negative endocrine ROS  Renal/GU negative Renal ROS     Musculoskeletal   Abdominal   Peds  Hematology negative hematology ROS (+)   Anesthesia Other Findings   Reproductive/Obstetrics                           Anesthesia Physical Anesthesia Plan  ASA: II  Anesthesia Plan: General   Post-op Pain Management:    Induction: Intravenous  Airway Management Planned: Oral ETT  Additional Equipment:   Intra-op Plan:   Post-operative Plan: Extubation in OR  Informed Consent: I have reviewed the patients History and Physical, chart, labs and discussed the procedure including the risks, benefits and alternatives for the proposed anesthesia with the patient or authorized representative who has indicated his/her understanding and acceptance.   Dental advisory given  Plan Discussed with: CRNA  Anesthesia Plan Comments:         Anesthesia Quick Evaluation

## 2014-12-18 NOTE — Anesthesia Procedure Notes (Signed)
Procedure Name: Intubation Date/Time: 12/18/2014 2:13 PM Performed by: Merrilyn Puma B Pre-anesthesia Checklist: Patient identified, Timeout performed, Emergency Drugs available, Suction available and Patient being monitored Patient Re-evaluated:Patient Re-evaluated prior to inductionOxygen Delivery Method: Circle system utilized Preoxygenation: Pre-oxygenation with 100% oxygen Intubation Type: IV induction Ventilation: Mask ventilation without difficulty Laryngoscope Size: Miller and 2 Grade View: Grade I Tube type: Oral Tube size: 7.0 mm Number of attempts: 1 Airway Equipment and Method: Stylet and LTA kit utilized Placement Confirmation: CO2 detector,  positive ETCO2,  ETT inserted through vocal cords under direct vision and breath sounds checked- equal and bilateral Secured at: 21 cm Tube secured with: Tape Dental Injury: Teeth and Oropharynx as per pre-operative assessment  Comments: DLx1 by Augusto Gamble CRNA

## 2014-12-18 NOTE — H&P (Signed)
I have re-examined and re-evaluated the patient and there are no changes. See office notes in paper chart for H&P.  Planned Procedure: Left breast reconstruction with implant

## 2014-12-18 NOTE — Anesthesia Postprocedure Evaluation (Signed)
  Anesthesia Post-op Note  Patient: Gabriela Carrillo  Procedure(s) Performed: Procedure(s): DELAYED LEFT BREAST RECONSTRUCTION WITH PLACEMENT OF GEL  IMPLANT (Left)  Patient Location: PACU  Anesthesia Type:General  Level of Consciousness: awake and alert   Airway and Oxygen Therapy: Patient Spontanous Breathing  Post-op Pain: mild  Post-op Assessment: Post-op Vital signs reviewed  Post-op Vital Signs: Reviewed  Last Vitals:  Filed Vitals:   12/18/14 1540  BP:   Pulse: 89  Temp:   Resp: 11    Complications: No apparent anesthesia complications

## 2014-12-18 NOTE — Brief Op Note (Signed)
12/18/2014  3:08 PM  PATIENT:  Gabriela Carrillo  59 y.o. female  PRE-OPERATIVE DIAGNOSIS:  BREAST CANCER  POST-OPERATIVE DIAGNOSIS:  BREAST CANCER  PROCEDURE:  Procedure(s): DELAYED LEFT BREAST RECONSTRUCTION WITH PLACEMENT OF GEL  IMPLANT (Left)  SURGEON:  Surgeon(s) and Role:    * Crissie Reese, MD - Primary  PHYSICIAN ASSISTANT:   ASSISTANTS: Judyann Munson, RNFA   ANESTHESIA:   general  EBL:  Total I/O In: 1000 [I.V.:1000] Out: 25 [Blood:25]  BLOOD ADMINISTERED:none  DRAINS: none   LOCAL MEDICATIONS USED:  NONE  SPECIMEN:  No Specimen  DISPOSITION OF SPECIMEN:  N/A  COUNTS:  YES  TOURNIQUET:  * No tourniquets in log *  DICTATION: .Other Dictation: Dictation Number [id number]  PLAN OF CARE: Discharge to home after PACU  PATIENT DISPOSITION:  PACU - hemodynamically stable.   Delay start of Pharmacological VTE agent (>24hrs) due to surgical blood loss or risk of bleeding: not applicable

## 2014-12-18 NOTE — Discharge Instructions (Addendum)
No lifting, no exercising, no raising arms overhead, no walking for exercise  No shower yet  No diet restrictions  Stay propped up  See Dr. Harlow Mares in office in am  For questions call [phone] or [phone]  General Anesthesia, Care After Refer to this sheet in the next few weeks. These instructions provide you with information on caring for yourself after your procedure. Your health care provider may also give you more specific instructions. Your treatment has been planned according to current medical practices, but problems sometimes occur. Call your health care provider if you have any problems or questions after your procedure. WHAT TO EXPECT AFTER THE PROCEDURE After the procedure, it is typical to experience:  Sleepiness.  Nausea and vomiting. HOME CARE INSTRUCTIONS  For the first 24 hours after general anesthesia:  Have a responsible person with you.  Do not drive a car. If you are alone, do not take public transportation.  Do not drink alcohol.  Do not take medicine that has not been prescribed by your health care provider.  Do not sign important papers or make important decisions.  You may resume a normal diet and activities as directed by your health care provider.  Change bandages (dressings) as directed.  If you have questions or problems that seem related to general anesthesia, call the hospital and ask for the anesthetist or anesthesiologist on call. SEEK MEDICAL CARE IF:  You have nausea and vomiting that continue the day after anesthesia.  You develop a rash. SEEK IMMEDIATE MEDICAL CARE IF:   You have difficulty breathing.  You have chest pain.  You have any allergic problems. Document Released: 10/31/2000 Document Revised: 07/30/2013 Document Reviewed: 02/07/2013 [facility] Patient Information 2015 [place], [place]. This information is not intended to replace advice given to you by your health care provider. Make sure you discuss any questions you have  with your health care provider.

## 2014-12-18 NOTE — Transfer of Care (Signed)
Immediate Anesthesia Transfer of Care Note  Patient: Gabriela Carrillo  Procedure(s) Performed: Procedure(s): DELAYED LEFT BREAST RECONSTRUCTION WITH PLACEMENT OF GEL  IMPLANT (Left)  Patient Location: PACU  Anesthesia Type:General  Level of Consciousness: awake, alert  and oriented  Airway & Oxygen Therapy: Patient Spontanous Breathing and Patient connected to nasal cannula oxygen  Post-op Assessment: Report given to RN, Post -op Vital signs reviewed and stable and Patient moving all extremities X 4  Post vital signs: Reviewed and stable  Last Vitals:  Filed Vitals:   12/18/14 1243  BP: 168/81  Pulse: 102  Temp: 36.9 C  Resp: 18  1521 HR 87, RR 8, Sats 99%, BP 786/76  Complications: No apparent anesthesia complications

## 2014-12-19 NOTE — Op Note (Signed)
NAME:  Gabriela Carrillo, Gabriela Carrillo               ACCOUNT NO.:  [id number]  MEDICAL RECORD NO.:  [id number]  LOCATION:  [facility]                         FACILITY:  [facility]  PHYSICIAN:  Crissie Reese, M.D.     DATE OF BIRTH:  04/05/1956  DATE OF PROCEDURE:  12/18/2014 DATE OF DISCHARGE:  12/18/2014                              OPERATIVE REPORT   PREOPERATIVE DIAGNOSES:  Left breast cancer, personal history with significant acquired asymmetry of the breast.  POSTOPERATIVE DIAGNOSES:  Left breast cancer, personal history with significant acquired asymmetry of the breast.  PROCEDURE PERFORMED:  Left delayed breast reconstruction with silicone gel implant.  SURGEON:  Crissie Reese, M.D.  ASSISTANT:  Judyann Munson, RNFA.  ANESTHESIA:  General.  ESTIMATED BLOOD LOSS:  2 mL.  CLINICAL NOTE:  A 59 year old woman who has had left breast cancer and underwent lumpectomy and radiation.  Over the years, she has developed a significant discrepancy in terms of size and position, left breast much smaller and elevated compared with the right.  She desired correction of this with a delayed breast reconstruction.  She viewed different types of implants; saline and silicone and elected the silicone, and using the sizing system in the office, she selected a 170 mL implant.  She agreed with submuscular placement and she also agreed with an inframammary crease approach.  Petra Kuba of the procedure, the risks and possible complications were discussed with her in detail.  These risks included, but were not limited to, bleeding, infection, healing problems, scarring, loss of sensation, loss of sensation in the nipple, fluid accumulations, anesthesia-related complications, pneumothorax, DVT, PE, failure of the device, capsular contracture, displacement of the device, wrinkles and ripples, asymmetry and chronic pain, and overall disappointment.  She clearly understood that the left breast would not be lowered any using this  operation, but that it would help to balance her volume with her opposite side.  She voiced understanding of all this and wished to proceed.  DESCRIPTION OF PROCEDURE:  The patient has marked in full standing position in the holding area for the breast reconstruction.  She was then taken to the operating room and placed supine.  After successful induction of general anesthesia, she was prepped with ChloraPrep and after waiting the full 3 minutes for drying, she was draped with sterile drapes including impervious drapes.  Nipple shields were used for this procedure.  An inframammary crease incision was made and dissection carried into the subcutaneous tissue and the underlying pectoralis major muscle was identified and elevated.  The muscle was released along the inframammary crease only with great care taken to avoid any release along the lateral border of the sternum.  The space was developed to the dimensions of the implant.  The implant was soaked in antibiotic solution.  This was a 191 mL Mentor silicone gel implant.  After careful check and meticulous hemostasis, antibiotic solution was placed in the space and allowed to dwell there.  After thoroughly cleaning gloves, the implant was then positioned after first prepping the skin around the incision with Betadine.  With the implant in position, care was taken to make sure it was oriented properly without any folds and it was  seated in the inferior aspect of the submuscular space.  Antibiotic solution again placed and the closure with 3-0 Vicryl simple interrupted sutures with great care taken to avoid damage to the underlying implant, which was kept under direct vision at all times.  Antibiotic solution again was used to irrigate the wound and 3-0 Monocryl interrupted inverted deep dermal sutures and running 4-0 Monocryl subcuticular suture completed the closure.  Dermabond, dry sterile dressing and the chest vest completed the  dressing and she was transferred to the recovery room stable having tolerated the procedure well.  DISPOSITION:  She will follow up in the office tomorrow morning.     Crissie Reese, M.D.     DB/MEDQ  D:  12/18/2014  T:  12/19/2014  Job:  [id number]

## 2014-12-22 ENCOUNTER — Encounter (HOSPITAL_COMMUNITY): Payer: Self-pay | Admitting: Plastic Surgery

## 2014-12-29 ENCOUNTER — Encounter (HOSPITAL_COMMUNITY): Payer: Self-pay | Admitting: Plastic Surgery

## 2015-03-02 ENCOUNTER — Ambulatory Visit: Admit: 2015-03-02 | Discharge: 2015-03-02 | Payer: PRIVATE HEALTH INSURANCE | Attending: Family Medicine

## 2015-03-02 DIAGNOSIS — F419 Anxiety disorder, unspecified: Secondary | ICD-10-CM

## 2015-03-02 MED ORDER — GABAPENTIN 600 MG PO TABS
600 MG | ORAL_TABLET | Freq: Every evening | ORAL | 1 refills | Status: DC
Start: 2015-03-02 — End: 2015-08-17

## 2015-03-02 MED ORDER — OXYCODONE-ACETAMINOPHEN 5-325 MG PO TABS
5-325 MG | ORAL_TABLET | Freq: Every evening | ORAL | 0 refills | Status: DC
Start: 2015-03-02 — End: 2015-08-17

## 2015-03-02 MED ORDER — CYCLOBENZAPRINE HCL 10 MG PO TABS
10 MG | ORAL_TABLET | ORAL | 1 refills | Status: DC
Start: 2015-03-02 — End: 2015-08-17

## 2015-03-02 MED ORDER — MONTELUKAST SODIUM 10 MG PO TABS
10 MG | ORAL_TABLET | ORAL | 1 refills | Status: DC
Start: 2015-03-02 — End: 2015-08-17

## 2015-03-02 MED ORDER — ZOLPIDEM TARTRATE 10 MG PO TABS
10 MG | ORAL_TABLET | Freq: Every evening | ORAL | 1 refills | Status: DC | PRN
Start: 2015-03-02 — End: 2015-08-17

## 2015-03-02 MED ORDER — ALBUTEROL SULFATE HFA 108 (90 BASE) MCG/ACT IN AERS
108 (90 Base) MCG/ACT | Freq: Four times a day (QID) | RESPIRATORY_TRACT | 1 refills | Status: DC | PRN
Start: 2015-03-02 — End: 2015-08-29

## 2015-03-02 MED ORDER — NYSTATIN 100000 UNIT/GM EX POWD
100000 UNIT/GM | CUTANEOUS | 3 refills | Status: DC
Start: 2015-03-02 — End: 2016-05-25

## 2015-03-02 MED ORDER — HYDROXYZINE HCL 50 MG PO TABS
50 MG | ORAL_TABLET | ORAL | 1 refills | Status: DC
Start: 2015-03-02 — End: 2015-08-17

## 2015-03-02 MED ORDER — CITALOPRAM HYDROBROMIDE 40 MG PO TABS
40 MG | ORAL_TABLET | ORAL | 1 refills | Status: DC
Start: 2015-03-02 — End: 2015-08-29

## 2015-03-02 MED ORDER — MELOXICAM 15 MG PO TABS
15 MG | ORAL_TABLET | ORAL | 1 refills | Status: DC
Start: 2015-03-02 — End: 2015-08-29

## 2015-03-02 NOTE — Progress Notes (Addendum)
Subjective:      Patient ID: Cassandra Copeland is a 59 y.o. female.    HPI  Chief Complaint   Patient presents with   ??? Back Pain     states the hydrocodone makes her itch, should like something different,  she thinks maybe a better muscle relaxer would be better    ??? Arthritis     takes the mobic    ??? Anxiety     has custody of 59 year old;   ??? Allergies     takes the singular and zyrtec ;nasal and sinuses;   ??? Asthma     doing well inspite of humidity;   ??? Other     nonfasting   ??? Other     ref all other health maint tests/screens   ??? Other     Nodule on left buccal mucosa ??2 weeks     see orders    Review of Systems   Constitutional: Negative.        Objective:   Physical Exam   Constitutional: She appears well-developed and well-nourished.   HENT:   Left buccal mucosa: Small pea-sized nodule flesh colored near mouth   Neck: Neck supple. Carotid bruit is not present. No thyromegaly present.   Cardiovascular: Normal rate, regular rhythm and normal heart sounds.    Pulmonary/Chest: Effort normal and breath sounds normal. She has no wheezes. She has no rales.   Musculoskeletal: She exhibits no edema.   Lymphadenopathy:     She has no cervical adenopathy.   Skin: Skin is warm.   Psychiatric: She has a normal mood and affect. Her behavior is normal.   Nursing note and vitals reviewed.    Visit Vitals   ??? BP 118/86   ??? Pulse 56   ??? Ht 5\' 3"  (1.6 m)   ??? Wt 199 lb (90.3 kg)   ??? SpO2 98%   ??? Breastfeeding No   ??? BMI 35.25 kg/m2       Assessment:      Cassandra Copeland was seen today for back pain, arthritis, anxiety, allergies, asthma, other, other and other.    Diagnoses and all orders for this visit:    Anxiety    Fibromyalgia  Orders:  -     hydrOXYzine (ATARAX) 50 MG tablet; TAKE 1 TABLET EVERY 8 HOURS AS NEEDED FOR ITCHING  -     citalopram (CELEXA) 40 MG tablet; TAKE 1 TABLET DAILY  -     meloxicam (MOBIC) 15 MG tablet; TAKE 1 TABLET DAILY  -     gabapentin (NEURONTIN) 600 MG tablet; Take 1.5 tablets by mouth nightly  -      cyclobenzaprine (FLEXERIL) 10 MG tablet; TAKE 1 TABLET THREE TIMES A DAY AS NEEDED FOR MUSCLE SPASMS  -     oxyCODONE-acetaminophen (PERCOCET) 5-325 MG per tablet; Take 1 tablet by mouth nightly    Depression, unspecified depression type  Orders:  -     citalopram (CELEXA) 40 MG tablet; TAKE 1 TABLET DAILY    Mild intermittent asthma without complication  Orders:  -     albuterol sulfate HFA 108 (90 BASE) MCG/ACT inhaler; Inhale 2 puffs into the lungs every 6 hours as needed for Wheezing    Primary insomnia  Orders:  -     zolpidem (AMBIEN) 10 MG tablet; Take 1 tablet by mouth nightly as needed for Sleep    Screening for lipid disorders  Orders:  -     LIPID PANEL  Oral mucosal lesion-observe ??2-3 weeks.  If it does not go away, call for ENT referral for biopsy    Other orders  -     nystatin (MYCOSTATIN) 100000 UNIT/GM powder; Apply once a day and prn  -     montelukast (SINGULAIR) 10 MG tablet; TAKE 1 TABLET NIGHTLY           Plan:      Return in about 6 months (around 09/02/2015).  There are no Patient Instructions on file for this visit.

## 2015-03-02 NOTE — Addendum Note (Signed)
Addended by: Charlestine MassedLLE, Malissie Musgrave LYNN on: 03/02/2015 12:19 PM     Modules accepted: Orders

## 2015-03-03 LAB — LIPID PANEL
Cholesterol, Total: 237 mg/dL — ABNORMAL HIGH (ref 0–199)
HDL: 52 mg/dL (ref 40–60)
LDL Calculated: 138 mg/dL — ABNORMAL HIGH (ref <100)
Triglycerides: 234 mg/dL — ABNORMAL HIGH (ref 0–150)
VLDL Cholesterol Calculated: 47 mg/dL

## 2015-03-08 NOTE — Other (Unsigned)
CURRENT STATUS IS EMERGENCY .      :     Any questions regarding PATIENT CLASS/STATUS should be directed to the Care   Management Departments: [facility] [phone] or [facility] [phone] or [facility]  [phone].         _________________________________  Signed byJamison Neighbor:    [facility]  NOTIFY    ZZ    D: 03/08/2015 08:08 PM  T:    This document is confidential medical information.  Unauthorized disclosure or   use of this information is prohibited by law.  If you are not the intended recipient of this document, please advise us by   calling immediately [phone].

## 2015-03-08 NOTE — Other (Unsigned)
[facility] Emergency Department ED Encounter Arrival Date: 03/08/15 2008   Cassandra Copeland                            DOB: 1956/01/03 [address]   [place] [address] MRN: [id number] CSN: [id number]     HAR: [id number]     EMERGENCY DEPARTMENT -  Extremity NOTE     CHIEF COMPLAINT Chief Complaint Patient presents with   Fall     HPI Cassandra Copeland is a 59 year old female who presents with chief complaint   of a fall landing on her left knee. The patient reports she fell directly on   her left anterior knee Schwartz severe pain and inability to straighten her   left leg Schwartz or numbness tingling or other complaints this time she   denies any loss consciousness she reports she has been unable to bear weight   since the fall she reports that she is not taking anything for pain she denies   any lacerations or lesions or other complaints at this time     REVIEW OF SYSTEMS See HPI for further details. All other review of systems   otherwise negative     PAST MEDICAL HISTORY Past Medical History Diagnosis Date   GERD   (gastroesophageal reflux disease)   Seasonal allergies   Unspecified   asthma(493.90)     SURGICAL HISTORY Past Surgical History Procedure Laterality Date     Cholecystectomy   Bladder surgery   *wrist-bilateral   Elbow surgery     CURRENT MEDICATIONS Prior to Admission medications Medication Sig Start Date   End Date Taking? Authorizing Provider cyclobenzaprine (FLEXERIL) 10 MG TABS   Take 10 mg by mouth 3 (three) times daily as needed.   Yes HISTORICAL MED   hydrOXYzine (ATARAX) 50 MG TABS Take 50 mg by mouth 3 (three) times daily as   needed.     Yes HISTORICAL MED montelukast (SINGULAIR) 10 MG TABS Take 10 mg   by mouth at bedtime.     Yes HISTORICAL MED zolpidem (AMBIEN) 5 MG tablet Take   5 mg by mouth at bedtime as needed.     Yes HISTORICAL MED cetirizine   (ZYRTEC) 10 MG TABS Take 10 mg by mouth daily.     Yes HISTORICAL MED   omeprazole (PRILOSEC) 20 MG CPDR Take 20 mg by mouth  daily.     Yes HISTORICAL   MED predniSONE (DELTASONE) 10 MG TABS Take 1 tablet by mouth See Admin   Instructions. Take 6 tabs on day one, Take 5 tabs on day two, Take 4 tabs on   day three, Take 3 tabs on day four, Take 2 tabs on day five and Take 1 tab on   day six.  02/13/11   Valinda Party, MD     ALLERGIES Allergies Allergen Reactions   Ceclor [Cefaclor] Not Recorded     Norco [Hydrocodone-Acetaminophen] Itch   Penicillins Not Recorded     FAMILY HISTORY No family history on file.     SOCIAL HISTORY Social History     Social History   Marital status: Married   Spouse name: N/A   Number of   children: N/A   Years of education: N/A     Social History Main Topics   Smoking status: Never Smoker   Smokeless   tobacco: None   Alcohol use Yes  Comment: occasionally   Drug use: No     Sexual activity: No     Other Topics Concern   None     Social History Narrative     PHYSICAL EXAM VITAL SIGNS: Visit Vitals   BP 130/52   Pulse 72   Temp 98.4  F   (36.9  C) (Oral)   Resp 16   Ht 64" (162.6 cm)   Wt 200 lb (90.7 kg)   SpO2   99%   BMI 34.33 kg/m2     Constitutional:  Well developed, well nourished, no acute distress, non-toxic   appearance HENT:  Atraumatic, external ears normal, nose normal, oropharynx   moist.  Neck- normal range of motion, no tenderness, supple Respiratory:  No   respiratory distress, normal breath sounds. Cardiovascular:  Normal rate,   normal rhythm, no murmurs, no gallops, no rubs GI:  Soft, nondistended, normal   bowel sounds, nontender Musculoskeletal: Tenderness noted to the anterior   aspect of the left knee with an obvious deformity to the patella pulses are   equal and symmetric on exam no obvious ligament laxity is noted Integument:    Well hydrated, no rash Neurologic: Sensation is equal and symmetric no focal   deficits are noted     LABORATORY No results found for this or any previous visit (from the past 24   hour(s)).     RADIOLOGY XR KNEE LEFT AP AND LATERAL Final Nada Godley     Complete  patellar fracture inferior one third with displacement and   retraction of the proximal patella.         Approximately 2-4% of significant acute fractures cannot be recognized on   initial radiographs. For this reason, clinical correlation is recommended and   if symptoms persist, repeat x-rays are recommended in 3-5 days. Other   modalities such as CT, bone scan  and MRI images can show fractures which   cannot be visualized on routine x-rays.         PROCEDURES     ED COURSE & MEDICAL DECISION MAKING     Active Problems:   * No active hospital problems. *     Pertinent Labs & Imaging studies reviewed. (See chart for details) Patient   does have a fracture involving the patella it is a transverse fracture she is   given intravenous medication for pain as well as for muscle spasm she is   placed in a knee immobilizer given crutches referral for orthopedic surgery   told ice and elevate given prescription medication for pain and told to return   for any worsening symptoms     Type of Fracture Care: Supportive Care     Fracture: closed  longitudinal  patella on the left     yes : Provided temporary stabilization only of fracture /dislocation     yes : Included order for and application of immobilization device    Application of a knee immobilizer and crutches were provided for ambulatory   support was applied by nursing and post-splint application exam was performed   by Beryl MeagerJarrad N. Burt KnackLifshitz, MD     yes Patients definitive or restorative treatment will be provided by a   specialist or other practitioner.             The patient was given the following medication in the Emergency department:     Medication Administration from 03/08/2015 2008 to 03/08/2015 2119      Date/Time Order Dose  Route Action Action by Comments   03/08/2015 2024   morphine injection 4 mg 4 mg Intravenous Given Dorethea Clan   03/08/2015 2024   ondansetron (ZOFRAN) injection 4 mg 4 mg Intravenous Given Dorethea Clan     03/08/2015 2102 HYDROmorphone  (DILAUDID) injection 1 mg 1 mg Intravenous Given   Wylie Hail   03/08/2015 2102 diazepam (VALIUM) injection 2.5 mg 2.5 mg   Intravenous Given Wylie Hail         FINAL DIAGNOSIS:     1. Traumatic closed fracture of patella with minimal displacement, left,   initial encounter         The patient was given the following medications to go home with.     New Prescriptions  METHOCARBAMOL (ROBAXIN) 750 MG TABS    Take 1 tablet by   mouth every 6 (six) hours as needed.  OXYCODONE-ACETAMINOPHEN (PERCOCET) 5-325   MG TABS    Take 1-2 tablets by mouth every 6 (six) hours as needed.             Lajuana Carry., MD 03/08/15 2120         _________________________________  Signed by:  Clarice Pole    D: 03/08/2015 08:55 PM  T: 03/08/2015 08:55 PM    This document is confidential medical information.  Unauthorized disclosure or   use of this information is prohibited by law.  If you are not the intended recipient of this document, please advise [place] by   calling immediately [phone].

## 2015-03-10 ENCOUNTER — Ambulatory Visit: Admit: 2015-03-10 | Discharge: 2015-03-10 | Payer: PRIVATE HEALTH INSURANCE | Attending: Family Medicine

## 2015-03-10 DIAGNOSIS — Z01818 Encounter for other preprocedural examination: Secondary | ICD-10-CM

## 2015-03-10 NOTE — Progress Notes (Signed)
Subjective:      Patient ID: Clarita CraneJoanna Brothers is a 59 y.o. female.    HPI   Chief Complaint   Patient presents with   ??? Pre-op Exam     left patella fracture x 3 days ago,03/11/15,Dr.Shockley,[facility]     Chief complaint present illness: 59 year old white female presents accompanied by husband for pre-op clearance for surgery tomorrow on 8 fracture of the left patella.  Patient broke it 3 days ago when she tripped on a gate at the bottom of the steps and fell over the gait onto the step.    See scanned note     Review of Systems  See scanned note     Objective:   Physical Exam   Visit Vitals   ??? BP 120/78 (Site: Left Arm, Position: Sitting, Cuff Size: Medium Adult)   ??? Pulse 62   ??? Temp 97.6 ??F (36.4 ??C) (Oral)   ??? Resp 16   ??? Ht 5\' 4"  (1.626 m)   ??? Wt 199 lb (90.3 kg)   ??? SpO2 98%   ??? BMI 34.16 kg/m2     See scanned note       Assessment:      [name] was seen today for pre-op exam.    Diagnoses and all orders for this visit:    Pre-op testing  Orders:  -     EKG 12 lead unit performed    Closed nondisplaced fracture of left patella, unspecified fracture morphology, initial encounter           Plan:      No Follow-up on file.  There are no Patient Instructions on file for this visit.

## 2015-03-11 NOTE — Telephone Encounter (Signed)
Yes it is okay to give her the prescription.

## 2015-03-11 NOTE — Telephone Encounter (Signed)
Pharmacy advised

## 2015-03-11 NOTE — Telephone Encounter (Signed)
Pharmacy called to clarify if it is ok to fill the oxycodone since it is noted that she has a reaction to the hydrocodone since they both have acetamoinophen in them?

## 2015-04-16 ENCOUNTER — Other Ambulatory Visit: Payer: Self-pay | Admitting: Internal Medicine

## 2015-04-17 ENCOUNTER — Other Ambulatory Visit: Payer: Self-pay

## 2015-04-17 MED ORDER — LOSARTAN POTASSIUM 100 MG PO TABS
50.0000 mg | ORAL_TABLET | Freq: Every day | ORAL | Status: DC
Start: 2015-04-17 — End: 2015-04-20

## 2015-04-20 ENCOUNTER — Other Ambulatory Visit: Payer: Self-pay | Admitting: Internal Medicine

## 2015-04-21 ENCOUNTER — Other Ambulatory Visit: Payer: Self-pay | Admitting: Emergency Medicine

## 2015-04-21 MED ORDER — FLUOXETINE HCL 20 MG PO CAPS: 20.0000 mg | ORAL_CAPSULE | Freq: Every day | ORAL | Status: DC

## 2015-04-30 ENCOUNTER — Other Ambulatory Visit (INDEPENDENT_AMBULATORY_CARE_PROVIDER_SITE_OTHER): Payer: 59

## 2015-04-30 ENCOUNTER — Ambulatory Visit (INDEPENDENT_AMBULATORY_CARE_PROVIDER_SITE_OTHER): Payer: 59 | Admitting: Internal Medicine

## 2015-04-30 ENCOUNTER — Encounter: Payer: Self-pay | Admitting: Internal Medicine

## 2015-04-30 VITALS — BP 130/84 | HR 82 | Temp 98.3°F | Resp 16 | Ht 64.5 in | Wt 144.0 lb

## 2015-04-30 DIAGNOSIS — Z0100 Encounter for examination of eyes and vision without abnormal findings: Secondary | ICD-10-CM

## 2015-04-30 DIAGNOSIS — Z0000 Encounter for general adult medical examination without abnormal findings: Secondary | ICD-10-CM

## 2015-04-30 DIAGNOSIS — Z0189 Encounter for other specified special examinations: Secondary | ICD-10-CM

## 2015-04-30 LAB — LIPID PANEL
CHOLESTEROL: 167 mg/dL (ref 0–200)
HDL: 54.9 mg/dL (ref >39.00)
LDL Cholesterol: 92 mg/dL (ref 0–99)
NonHDL: 111.61
Total CHOL/HDL Ratio: 3
Triglycerides: 96 mg/dL (ref 0.0–149.0)
VLDL: 19.2 mg/dL (ref 0.0–40.0)

## 2015-04-30 LAB — HEPATIC FUNCTION PANEL
ALT: 20 U/L (ref 0–35)
AST: 25 U/L (ref 0–37)
Albumin: 4.4 g/dL (ref 3.5–5.2)
Alkaline Phosphatase: 83 U/L (ref 39–117)
Bilirubin, Direct: 0.1 mg/dL (ref 0.0–0.3)
Total Bilirubin: 0.5 mg/dL (ref 0.2–1.2)
Total Protein: 7.3 g/dL (ref 6.0–8.3)

## 2015-04-30 LAB — TSH: TSH: 0.99 u[IU]/mL (ref 0.35–4.50)

## 2015-04-30 MED ORDER — FLUOXETINE HCL 20 MG PO CAPS: 20.0000 mg | ORAL_CAPSULE | Freq: Every day | ORAL | Status: DC

## 2015-04-30 MED ORDER — LOSARTAN POTASSIUM 100 MG PO TABS: 50.0000 mg | ORAL_TABLET | Freq: Every day | ORAL | Status: DC
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
## 2015-04-30 NOTE — Patient Instructions (Signed)
Minimal Blood Pressure Goal= AVERAGE < 140/90;  Ideal is an AVERAGE < 135/85. This AVERAGE should be calculated from @ least 5-7 BP readings taken @ different times of day on different days of week. You should not respond to isolated BP readings , but rather the AVERAGE for that week .Please bring your  blood pressure cuff to office visits to verify that it is reliable.It  can also be checked against the blood pressure device at the pharmacy. Finger or wrist cuffs are not dependable; an arm cuff is.   Your next office appointment will be determined based upon review of your pending labs  and  xrays  Those written interpretation of the lab results and instructions will be transmitted to you by My Chart   Critical results will be called.   Followup as needed for any active or acute issue. Please report any significant change in your symptoms. 

## 2015-04-30 NOTE — Progress Notes (Signed)
Pre visit review using our clinic review tool, if applicable. No additional management support is needed unless otherwise documented below in the visit note. 

## 2015-05-25 ENCOUNTER — Other Ambulatory Visit: Payer: Self-pay

## 2015-05-25 DIAGNOSIS — Z9889 Other specified postprocedural states: Secondary | ICD-10-CM

## 2015-05-25 DIAGNOSIS — Z1231 Encounter for screening mammogram for malignant neoplasm of breast: Secondary | ICD-10-CM

## 2015-05-25 DIAGNOSIS — Z9882 Breast implant status: Secondary | ICD-10-CM

## 2015-06-16 ENCOUNTER — Ambulatory Visit
Admission: RE | Admit: 2015-06-16 | Discharge: 2015-06-16 | Disposition: A | Discharge status: Home / Self Care | Payer: Managed Care, Other (non HMO) | Source: Ambulatory Visit

## 2015-06-16 DIAGNOSIS — Z1231 Encounter for screening mammogram for malignant neoplasm of breast: Secondary | ICD-10-CM

## 2015-06-16 DIAGNOSIS — Z9882 Breast implant status: Secondary | ICD-10-CM

## 2015-06-16 DIAGNOSIS — Z9889 Other specified postprocedural states: Secondary | ICD-10-CM

## 2015-06-18 ENCOUNTER — Other Ambulatory Visit: Payer: Self-pay | Admitting: Internal Medicine

## 2015-08-11 ENCOUNTER — Encounter

## 2015-08-11 NOTE — Telephone Encounter (Signed)
Last seen 03/02/15, told to fu in 6 months.  No appt on file

## 2015-08-11 NOTE — Telephone Encounter (Signed)
Patient notified she needs appt

## 2015-08-17 ENCOUNTER — Ambulatory Visit: Admit: 2015-08-17 | Discharge: 2015-08-17 | Payer: PRIVATE HEALTH INSURANCE | Attending: Family Medicine

## 2015-08-17 DIAGNOSIS — M797 Fibromyalgia: Secondary | ICD-10-CM

## 2015-08-17 MED ORDER — CYCLOBENZAPRINE HCL 10 MG PO TABS
10 MG | ORAL_TABLET | Freq: Every evening | ORAL | 1 refills | Status: DC
Start: 2015-08-17 — End: 2015-12-15

## 2015-08-17 MED ORDER — OXYCODONE-ACETAMINOPHEN 5-325 MG PO TABS
5-325 MG | ORAL_TABLET | Freq: Every evening | ORAL | 0 refills | Status: DC
Start: 2015-08-17 — End: 2015-12-15

## 2015-08-17 MED ORDER — HYDROXYZINE HCL 50 MG PO TABS
50 MG | ORAL_TABLET | ORAL | 1 refills | Status: DC
Start: 2015-08-17 — End: 2015-12-15

## 2015-08-17 MED ORDER — AMLODIPINE BESYLATE 2.5 MG PO TABS
2.5 MG | ORAL_TABLET | Freq: Every day | ORAL | 1 refills | Status: DC
Start: 2015-08-17 — End: 2015-12-15

## 2015-08-17 MED ORDER — MONTELUKAST SODIUM 10 MG PO TABS
10 MG | ORAL_TABLET | ORAL | 1 refills | Status: DC
Start: 2015-08-17 — End: 2016-02-21

## 2015-08-17 MED ORDER — ZOLPIDEM TARTRATE 10 MG PO TABS
10 MG | ORAL_TABLET | Freq: Every evening | ORAL | 1 refills | Status: DC | PRN
Start: 2015-08-17 — End: 2015-12-15

## 2015-08-17 NOTE — Patient Instructions (Signed)
Decrease gabapentin to 600 mgm nightly for one month. Then 1/2 tablet nightly for one month. Then stop.

## 2015-08-17 NOTE — Progress Notes (Signed)
Subjective:      Patient ID: Cassandra Copeland is a 60 y.o. female.    HPI   Chief Complaint   Patient presents with   ??? Anxiety     OARRS ran 08/14/15   ??? Depression   ??? Pain     fibromyalgia   ??? Insomnia   ??? Asthma     Uses albuterol once or twice a month in the summertime only      Chief Complaint   Patient presents with   ??? Anxiety     OARRS ran 08/14/15   ??? Depression-stress;grandson lives with pt;also,has nph;   ??? Pain     fibromyalgia   ??? Insomnia   Get rif of gaba for strawberry allergy;  ASTHMA-JUST IN SUMMER;RARE;  Review of Systems   Constitutional: Negative.    Respiratory: Negative.    Cardiovascular: Negative.    Neurological: Negative.        Objective:   Physical Exam   Constitutional: She appears well-developed and well-nourished. No distress.   Eyes: Conjunctivae are normal. No scleral icterus.   Neck: Neck supple. Carotid bruit is not present. No thyromegaly present.   Cardiovascular: Normal rate, regular rhythm, normal heart sounds and intact distal pulses.    Pulmonary/Chest: Effort normal and breath sounds normal. She has no wheezes. She has no rales.   Musculoskeletal: She exhibits edema (tracece pretibv on L;site of tkr). She exhibits no tenderness.   Lymphadenopathy:     She has no cervical adenopathy.   Skin: Skin is warm. No pallor.   Psychiatric: She has a normal mood and affect. Her behavior is normal.   Nursing note and vitals reviewed.    Visit Vitals   ??? BP (!) 150/94   ??? Pulse 56   ??? Ht 5\' 3"  (1.6 m)   ??? Wt 200 lb (90.7 kg)   ??? SpO2 96%   ??? Breastfeeding No   ??? BMI 35.43 kg/m2     Controlled Substances Monitoring: Attestation: The Prescription Monitoring Report for this patient was reviewed today. Cassandra Copeland(Cassandra Iribe F Quandre Polinski, MD)  Documentation: Possible medication side effects, risk of tolerance and/or dependence, and alternative treatments discussed, No signs of potential drug abuse or diversion identified. Cassandra Copeland(Cassandra Cordone F Dyana Magner, MD)   Assessment:      Cassandra Copeland was seen today for anxiety, depression,  pain, insomnia and asthma.    Diagnoses and all orders for this visit:    Fibromyalgia  -     hydrOXYzine (ATARAX) 50 MG tablet; TAKE 1 TABLET EVERY 8 HOURS AS NEEDED FOR ITCHING  -     cyclobenzaprine (FLEXERIL) 10 MG tablet; Take 1 tablet by mouth nightly TAKE 1 TABLET THREE TIMES A DAY AS NEEDED FOR MUSCLE SPASMS  -     oxyCODONE-acetaminophen (PERCOCET) 5-325 MG per tablet; Take 1 tablet by mouth nightly .    Depression, unspecified depression type    Primary insomnia  -     zolpidem (AMBIEN) 10 MG tablet; Take 1 tablet by mouth nightly as needed for Sleep    Mild intermittent asthma without complication    Essential hypertension  -     amLODIPine (NORVASC) 2.5 MG tablet; Take 2 tablets by mouth daily    Other orders  -     montelukast (SINGULAIR) 10 MG tablet; TAKE 1 TABLET NIGHTLY           Plan:      Return in about 3 months (around 11/15/2015).  Patient Instructions  Decrease gabapentin to 600 mgm nightly for one month. Then 1/2 tablet nightly for one month. Then stop.

## 2015-08-29 ENCOUNTER — Encounter

## 2015-08-31 MED ORDER — MELOXICAM 15 MG PO TABS
15 MG | ORAL_TABLET | ORAL | 0 refills | Status: DC
Start: 2015-08-31 — End: 2015-09-08

## 2015-08-31 MED ORDER — PROAIR HFA 108 (90 BASE) MCG/ACT IN AERS
108 (90 Base) MCG/ACT | RESPIRATORY_TRACT | 0 refills | Status: DC
Start: 2015-08-31 — End: 2015-09-08

## 2015-08-31 MED ORDER — CITALOPRAM HYDROBROMIDE 40 MG PO TABS
40 MG | ORAL_TABLET | ORAL | 0 refills | Status: DC
Start: 2015-08-31 — End: 2015-09-08

## 2015-08-31 NOTE — Telephone Encounter (Signed)
Patient last seen on 08/17/15. Advised to follow up 3 months. Next appointment none.

## 2015-09-08 ENCOUNTER — Encounter

## 2015-09-08 MED ORDER — PROAIR HFA 108 (90 BASE) MCG/ACT IN AERS
108 (90 Base) MCG/ACT | RESPIRATORY_TRACT | 0 refills | Status: DC
Start: 2015-09-08 — End: 2016-02-04

## 2015-09-08 MED ORDER — CITALOPRAM HYDROBROMIDE 40 MG PO TABS
40 MG | ORAL_TABLET | ORAL | 0 refills | Status: DC
Start: 2015-09-08 — End: 2016-02-04

## 2015-09-08 MED ORDER — MELOXICAM 15 MG PO TABS
15 MG | ORAL_TABLET | ORAL | 0 refills | Status: DC
Start: 2015-09-08 — End: 2016-02-04

## 2015-09-08 NOTE — Telephone Encounter (Signed)
Last seen 08/17/15, told to fu in 3 months.  No appt on file

## 2015-09-28 ENCOUNTER — Ambulatory Visit: Admit: 2015-09-28 | Discharge: 2015-09-28 | Payer: PRIVATE HEALTH INSURANCE | Attending: Physician Assistant

## 2015-09-28 ENCOUNTER — Ambulatory Visit: Admit: 2015-09-28 | Payer: PRIVATE HEALTH INSURANCE

## 2015-09-28 DIAGNOSIS — M25511 Pain in right shoulder: Secondary | ICD-10-CM

## 2015-09-28 MED ORDER — METHYLPREDNISOLONE 4 MG PO TBPK
4 MG | PACK | ORAL | 0 refills | Status: AC
Start: 2015-09-28 — End: 2015-10-04

## 2015-09-28 NOTE — Progress Notes (Signed)
Chief Complaint:  Shoulder Pain (RIGHT   )  (right)    History of Present Illness:  Cassandra Copeland is a 60 y.o. LHD female here regarding right shoulder pain. The pain is located in the anterior glenohumeral region. She describes the symptoms as aching. Symptoms improve with rest, avoiding the painful activities. The symptoms are worse with certain motions of the arm.  Patient reports her pain began approximately one month ago, when she felt a pop and pain in her shoulder.  Since then she has had some catching and popping in her shoulder.  She has had injection in her biceps tendon approximately 2 years ago which helped.  She has had a previous surgery approximately 10 years ago possible labral repair she is not sure exactly what it was.  Now she denies any numbness or tingling in her hand or fingers.     Medical History:  Patient's medications, allergies, past medical, surgical, social and family histories were reviewed and updated as appropriate.    REVIEW OF SYSTEMS:   Pertinent items are noted in HPI  Review of systems reviewed from Patient History Form dated on 09/28/2015 and available in the patient's chart under the Media tab.       Vital Signs:  Vitals:    09/28/15 1009   BP: 131/75   Pulse: 86       Pain Assessment:       General Exam:   Constitutional: Patient is adequately groomed with no evidence of malnutrition  Mental Status: The patient is oriented to time, place and person.  The patient's mood and affect are appropriate.  Neurological: The patient has good coordination.  There is no weakness or sensory deficit.    Shoulder Examination:  Inspection:  No gross deformities noted. No atrophy, erythema or ecchymosis. Skin is intact.    Palpation:  Tender to palpation in the anterior aspect    Range of Motion:  Essentially full range of motion with some discomfort at the extremes of forward flexion and abduction    Strength: Adequate strength tested in all directions    Special Tests:  Positive  Yergason's    Skin: There are no rashes, ulcerations or lesions.    Additional Comments: Sensation is intact to light touch throughout the median, ulnar and radial nerve distribution. Able to wiggle fingers, give thumbs up, A-OK and cross index and middle fingers.     Additional Examinations:  Left Upper Extremity: Examination of the left upper extremity does not show any tenderness, deformity or injury.  Range of motion is within normal limits.  There is no gross instability.  There are no rashes, ulcerations or lesions.  Strength and tone are normal.    XR SHOULDER RIGHT STANDARD  Diagnostic Test Findings:    4 views of the right shoulder Minimal arthritic changes, no fracture or dislocation    Assessment: right shoulder biceps tendinitis     Impression:  Encounter Diagnoses   Name Primary?   ??? Right shoulder pain, unspecified chronicity Yes   ??? Biceps tendinitis, right        Office Procedures:  Orders Placed This Encounter   Procedures   ??? XR Shoulder Right Standard     Order Specific Question:   Reason for exam:     Answer:   pain       Treatment Plan:  At this time we'll plan on proceeding the patient a Medrol Dosepak to take for the next 5-6 days.  She'll follow-up  with [place] in approximately 3-4 weeks and we can reevaluate again at that time.  We discussed the possibility of injecting her biceps tendon but would like to hold off on that for now.        This dictation was performed with a verbal recognition program (DRAGON) and it was checked for errors. It is possible that there are still dictated errors within this office note. If so, please bring any errors to my attention for an addendum. All efforts were made to ensure that this office note is accurate.

## 2015-10-20 ENCOUNTER — Other Ambulatory Visit: Payer: Self-pay | Admitting: Internal Medicine

## 2015-10-20 NOTE — Telephone Encounter (Signed)
Left message advising patient to call back to schedule appt/get established with new pcp---appt can be in aug/sept 2017----let Curstin Schmale know when appt is made so that losartan refill can be sent to pharm

## 2015-10-20 NOTE — Telephone Encounter (Signed)
Pt has an appt with Dr. Quay Burow on 05/03/16.

## 2015-10-20 NOTE — Telephone Encounter (Signed)
Sent refill to pharmacy.../lmb 

## 2015-10-22 ENCOUNTER — Encounter: Attending: Physician Assistant

## 2015-11-10 ENCOUNTER — Ambulatory Visit (INDEPENDENT_AMBULATORY_CARE_PROVIDER_SITE_OTHER): Payer: Managed Care, Other (non HMO) | Admitting: Internal Medicine

## 2015-11-10 ENCOUNTER — Encounter: Payer: Self-pay | Admitting: Internal Medicine

## 2015-11-10 VITALS — BP 140/78 | HR 102 | Resp 20 | Wt 148.0 lb

## 2015-11-10 DIAGNOSIS — M26629 Arthralgia of temporomandibular joint, unspecified side: Secondary | ICD-10-CM

## 2015-11-10 DIAGNOSIS — J309 Allergic rhinitis, unspecified: Secondary | ICD-10-CM

## 2015-11-10 DIAGNOSIS — F411 Generalized anxiety disorder: Secondary | ICD-10-CM

## 2015-11-10 DIAGNOSIS — H9192 Unspecified hearing loss, left ear: Secondary | ICD-10-CM | POA: Diagnosis not present

## 2015-11-10 NOTE — Patient Instructions (Signed)
OK to take OTC zyrtec and /or mucinex for allergies and congestion   OK to take OTC alleve or tylenol for pain  Please continue all other medications as before, and refills have been done if requested.  Please have the pharmacy call with any other refills you may need.  Please keep your appointments with your specialists as you may have planned

## 2015-11-11 DIAGNOSIS — H9192 Unspecified hearing loss, left ear: Secondary | ICD-10-CM | POA: Insufficient documentation

## 2015-11-11 DIAGNOSIS — M26629 Arthralgia of temporomandibular joint, unspecified side: Secondary | ICD-10-CM | POA: Insufficient documentation

## 2015-11-11 DIAGNOSIS — J309 Allergic rhinitis, unspecified: Secondary | ICD-10-CM | POA: Insufficient documentation

## 2015-11-11 NOTE — Assessment & Plan Note (Signed)
Mild persistent, o/w stable overall by history and exam, recent data reviewed with pt, and pt to continue medical treatment as before,  to f/u any worsening symptoms or concerns Lab Results  Component Value Date   WBC 6.6 12/10/2014   HGB 15.5* 12/10/2014   HCT 43.8 12/10/2014   PLT 233 12/10/2014   GLUCOSE 86 12/10/2014   CHOL 167 04/30/2015   TRIG 96.0 04/30/2015   HDL 54.90 04/30/2015   LDLCALC 92 04/30/2015   ALT 20 04/30/2015   AST 25 04/30/2015   NA 142 12/10/2014   K 3.7 12/10/2014   CL 104 12/10/2014   CREATININE 0.63 12/10/2014   BUN 6 12/10/2014   CO2 26 12/10/2014   TSH 0.99 04/30/2015   HGBA1C 4.8 07/25/2008

## 2015-11-11 NOTE — Progress Notes (Signed)
Subjective:    Patient ID: Gabriela Carrillo, female    DOB: 1956/03/30, 60 y.o.   MRN: [id number]  HPI  Here with 3 days onset acute on recurrent achy and sharp pain interrmittent mild to mod to the left preauricular area, worse with yawning, talking, and chewing, better with not doing these.  Wants to r/o ear infection, but no specific ear pain, vertigo, canal d/c, fever. Does have some reduced hearing in the past week, ? Wax impaction.  Denies worsening depressive symptoms, suicidal ideation, or panic; has ongoing anxiety, assoc with recurring headaches.  Also Does have several wks ongoing nasal allergy symptoms with clearish congestion, itch and sneezing, without fever, pain, ST, cough, swelling or wheezing. Pt denies chest pain, increased sob or doe, wheezing, orthopnea, PND, increased LE swelling, palpitations, dizziness or syncope.  Pt denies new neurological symptoms such as new headache, or facial or extremity weakness or numbness   Pt denies polydipsia, polyuria.   Past Medical History  Diagnosis Date  . Hypokalemia     PMH OF  . Cancer (HCC)     BREAST,PMH OF  . 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 Depression     PMH of  . Hypertension     Has been taking Losartan for about a year.   Past Surgical History  Procedure Laterality Date  . Breast lumpectomy  2004    CHEMOTHERAPY  FOLLOWED BY RADIATION  . Cesarean section    . G3 p2    . Bunionectomy bilaterally    . Colonoscopy  2011    negative  . Breast reconstruction Left 12/18/2014    Procedure: DELAYED LEFT BREAST RECONSTRUCTION WITH PLACEMENT OF GEL  IMPLANT;  Surgeon: Crissie Reese, MD;  Location: [facility];  Service: Plastics;  Laterality: Left;    reports that she has never smoked. She has never used smokeless tobacco. She reports that she drinks alcohol. Her drug history is not on file. family history includes Anxiety disorder in her mother; Arthritis in her sister;  Diabetes in her mother; Goiter in her mother; Hypertension in her sister. There is no history of Cancer, Heart disease, or Stroke. Allergies  Allergen Reactions  . Sulfonamide Derivatives     REACTION: ITCHING   Current Outpatient Prescriptions on File Prior to Visit  Medication Sig Dispense Refill  . FLUoxetine (PROZAC) 20 MG capsule Take 1 capsule (20 mg total) by mouth daily. 90 capsule 3  . losartan (COZAAR) 100 MG tablet Take 0.5 tablets (50 mg total) by mouth daily. Must keep appt w/new provider for future refills 90 tablet 0   No current facility-administered medications on file prior to visit.   Review of Systems  Constitutional: Negative for unusual diaphoresis or night sweats HENT: Negative for ear swelling or discharge Eyes: Negative for worsening visual haziness  Respiratory: Negative for choking and stridor.   Gastrointestinal: Negative for distension or worsening eructation Genitourinary: Negative for retention or change in urine volume.  Musculoskeletal: Negative for other MSK pain or swelling Skin: Negative for color change and worsening wound Neurological: Negative for tremors and numbness other than noted  Psychiatric/Behavioral: Negative for decreased concentration or agitation other than above       Objective:   Physical Exam BP 140/78 mmHg  Pulse 102  Resp 20  Wt 148 lb (67.132 kg)  SpO2 97% VS noted,  Constitutional: Pt appears in  no apparent distress HENT: Head: NCAT.  Right Ear: External ear normal. Bilat canals without erythema,, swelling, d/c Left Ear: External ear normal. , left canal clear after wax impaction irrigated, hearing improved Bilat tm's with mild erythema.  Max sinus areas non tender.  Pharynx with mild erythema, no exudate Left TMJ mild tender, no palpable click on opening/closing Eyes: . Pupils are equal, round, and reactive to light. Conjunctivae and EOM are normal Neck: Normal range of motion. Neck supple.  Cardiovascular: Normal  rate and regular rhythm.   Pulmonary/Chest: Effort normal and breath sounds without rales or wheezing.  Neurological: Pt is alert. Not confused , motor grossly intact Skin: Skin is warm. No rash, no LE edema Psychiatric: Pt behavior is normal. No agitation. 1+ nervous    Assessment & Plan:

## 2015-11-11 NOTE — Assessment & Plan Note (Signed)
,  Mild to mod, for tylenol prn,  to f/u any worsening symptoms or concerns, denies teeth grinding at night

## 2015-11-11 NOTE — Assessment & Plan Note (Signed)
Mild due to wax impaction, improved s/p irrigation today,  to f/u any worsening symptoms or concerns

## 2015-11-11 NOTE — Assessment & Plan Note (Signed)
Mild, d/w pt, for OTC allegra prn,  to f/u any worsening symptoms or concerns

## 2015-12-15 ENCOUNTER — Ambulatory Visit: Admit: 2015-12-15 | Discharge: 2015-12-15 | Payer: PRIVATE HEALTH INSURANCE | Attending: Family Medicine

## 2015-12-15 DIAGNOSIS — J301 Allergic rhinitis due to pollen: Secondary | ICD-10-CM

## 2015-12-15 MED ORDER — HYDROXYZINE HCL 50 MG PO TABS
50 | ORAL_TABLET | ORAL | 1 refills | Status: DC
Start: 2015-12-15 — End: 2016-09-01

## 2015-12-15 MED ORDER — LISINOPRIL 5 MG PO TABS
5 MG | ORAL_TABLET | Freq: Every day | ORAL | 1 refills | Status: DC
Start: 2015-12-15 — End: 2016-05-25

## 2015-12-15 MED ORDER — AMLODIPINE BESYLATE 5 MG PO TABS
5 MG | ORAL_TABLET | Freq: Every day | ORAL | 1 refills | Status: DC
Start: 2015-12-15 — End: 2016-05-25

## 2015-12-15 MED ORDER — OXYCODONE-ACETAMINOPHEN 5-325 MG PO TABS
5-325 | ORAL_TABLET | Freq: Every evening | ORAL | 0 refills | Status: DC
Start: 2015-12-15 — End: 2016-09-01

## 2015-12-15 MED ORDER — ZOLPIDEM TARTRATE 10 MG PO TABS
10 | ORAL_TABLET | Freq: Every evening | ORAL | 1 refills | Status: DC | PRN
Start: 2015-12-15 — End: 2016-06-10

## 2015-12-15 NOTE — Progress Notes (Signed)
Subjective:      Patient ID: Cassandra Copeland is a 60 y.o. female.    HPI   Chief Complaint   Patient presents with   ??? Hypertension-Denies cv/cns/claud sx    ??? Back Pain-stable;     OARRS ran today    ??? Insomnia-uses ambien;   ??? Depression-controlled;   ??? Sinus Problem     states her face and head feel funny-few dayhs in the tunnel;some rhinorhea;+seasonal allergies;no fever;feels soem post nasal drip;     Chief complaint and present illness: 60 year old white female presents unaccompanied for routine follow-up but also some sinus issues.  Some component of seasonal allergies.    Review of Systems   All other systems reviewed and are negative.      Objective:   Physical Exam   Constitutional: She appears well-developed and well-nourished.   HENT:   Head: Normocephalic.   Right Ear: Tympanic membrane and external ear normal.   Left Ear: Tympanic membrane and external ear normal.   Nose: Nose normal.   Mouth/Throat: Oropharynx is clear and moist.   Eyes: Conjunctivae are normal.   Neck: Neck supple.   Cardiovascular: Normal rate, regular rhythm and normal heart sounds.    Pulmonary/Chest: Effort normal and breath sounds normal. She has no wheezes. She has no rales.   Lymphadenopathy:     She has no cervical adenopathy.     She has no axillary adenopathy.        Right: No supraclavicular adenopathy present.        Left: No supraclavicular adenopathy present.   Skin: Skin is warm. No rash noted.   Psychiatric: She has a normal mood and affect. Her behavior is normal. Thought content normal.   Nursing note and vitals reviewed.    BP (!) 144/90   Pulse 55   Ht 5\' 4"  (1.626 m)   Wt 198 lb (89.8 kg)   SpO2 98%   BMI 33.99 kg/m2  Controlled Substances Monitoring: Attestation: The Prescription Monitoring Report for this patient was reviewed today. Everardo Beals(Harlem Bula F Carson Meche, MD)  Documentation: Possible medication side effects, risk of tolerance and/or dependence, and alternative treatments discussed, No signs of potential drug abuse  or diversion identified. Everardo Beals(Vassie Kugel F Maxamus Colao, MD)   Assessment:      Cassandra Copeland was seen today for hypertension, back pain, insomnia, depression and sinus problem.    Diagnoses and all orders for this visit:    Seasonal allergic rhinitis due to pollen    Fibromyalgia  -     oxyCODONE-acetaminophen (PERCOCET) 5-325 MG per tablet; Take 1 tablet by mouth nightly .  -     hydrOXYzine (ATARAX) 50 MG tablet; TAKE 1 TABLET EVERY 8 HOURS AS NEEDED FOR ITCHING    Primary insomnia  -     zolpidem (AMBIEN) 10 MG tablet; Take 1 tablet by mouth nightly as needed for Sleep    Mild intermittent asthma without complication    Essential hypertension  -     amLODIPine (NORVASC) 5 MG tablet; Take 1 tablet by mouth daily  -     lisinopril (PRINIVIL;ZESTRIL) 5 MG tablet; Take 1 tablet by mouth daily    Other orders  -     Cancel: amLODIPine (NORVASC) 2.5 MG tablet; Take 2 tablets by mouth daily           Plan:      Return in about 4 weeks (around 01/12/2016).  There are no Patient Instructions on file for this visit.

## 2016-01-15 ENCOUNTER — Ambulatory Visit: Admit: 2016-01-15 | Discharge: 2016-01-15 | Payer: PRIVATE HEALTH INSURANCE | Attending: Family Medicine

## 2016-01-15 DIAGNOSIS — I10 Essential (primary) hypertension: Secondary | ICD-10-CM

## 2016-01-15 MED ORDER — PREDNISONE 10 MG PO TABS
10 MG | ORAL_TABLET | Freq: Every day | ORAL | 0 refills | Status: AC
Start: 2016-01-15 — End: 2016-01-22

## 2016-01-15 NOTE — Progress Notes (Signed)
Subjective:      Patient ID: Cassandra Copeland is a 60 y.o. female.    HPI  Chief Complaint   Patient presents with   ??? Hypertension-now on amlodipine and lisinopril.  No real complaints about medicine. Denies cv/cns/claud sx j     1 mo fu since increasing her bp med    ??? Ear Fullness-no vertigo; Mucinex does not work; does have allergies.  No pain      states her ears are still having alot of pressure      Chief complaint and present illness: 60 year old white female presents unaccompanied for recheck of blood pressure since being placed on amlodipine 5 and lisinopril 5.  No dizziness.  Is still complaining of ears feeling plugged.  Denies pruritus or discharge.      Review of Systems   Constitutional: Negative.    Respiratory: Negative.        Objective:   Physical Exam   HENT:   Bilateral serous otitis, right less than left   Nursing note and vitals reviewed.    Ht 5\' 4"  (1.626 m)   Wt 198 lb (89.8 kg)   Breastfeeding? No   BMI 33.99 kg/m2    Assessment:      Cassandra Copeland was seen today for hypertension and ear fullness.    Diagnoses and all orders for this visit:    Essential hypertension    Seromucinous otitis media, bilateral  -     predniSONE (DELTASONE) 10 MG tablet; Take 2 tablets by mouth daily for 7 days           Plan:      .follouw  Return in about 6 months (around 07/16/2016).   There are no Patient Instructions on file for this visit.

## 2016-02-04 ENCOUNTER — Encounter

## 2016-02-04 MED ORDER — CITALOPRAM HYDROBROMIDE 40 MG PO TABS
40 MG | ORAL_TABLET | ORAL | 1 refills | Status: DC
Start: 2016-02-04 — End: 2016-09-01

## 2016-02-04 MED ORDER — MELOXICAM 15 MG PO TABS
15 MG | ORAL_TABLET | ORAL | 1 refills | Status: DC
Start: 2016-02-04 — End: 2017-02-15

## 2016-02-04 MED ORDER — PROAIR HFA 108 (90 BASE) MCG/ACT IN AERS
108 (90 Base) MCG/ACT | RESPIRATORY_TRACT | 1 refills | Status: AC
Start: 2016-02-04 — End: ?

## 2016-02-04 NOTE — Telephone Encounter (Signed)
Refill Request for - Citalopram 40, Proair HFA, Meloxicam 15    Last visit- 01/15/16    Told to follow up- 6 months 07/16/16    Pending appointment- none    Additional Comments -

## 2016-02-22 MED ORDER — MONTELUKAST SODIUM 10 MG PO TABS
10 MG | ORAL_TABLET | ORAL | 3 refills | Status: DC
Start: 2016-02-22 — End: 2017-02-15

## 2016-02-22 NOTE — Telephone Encounter (Signed)
Patient last seen on 01/15/16. Advised to follow up 6 months. Next appointment none.

## 2016-04-01 ENCOUNTER — Encounter

## 2016-05-03 ENCOUNTER — Other Ambulatory Visit (INDEPENDENT_AMBULATORY_CARE_PROVIDER_SITE_OTHER): Payer: Managed Care, Other (non HMO)

## 2016-05-03 ENCOUNTER — Ambulatory Visit (INDEPENDENT_AMBULATORY_CARE_PROVIDER_SITE_OTHER): Payer: Managed Care, Other (non HMO) | Admitting: Internal Medicine

## 2016-05-03 ENCOUNTER — Encounter: Payer: Self-pay | Admitting: Internal Medicine

## 2016-05-03 VITALS — BP 164/94 | HR 91 | Temp 98.3°F | Resp 16 | Wt 146.0 lb

## 2016-05-03 DIAGNOSIS — Z0000 Encounter for general adult medical examination without abnormal findings: Secondary | ICD-10-CM

## 2016-05-03 DIAGNOSIS — I10 Essential (primary) hypertension: Secondary | ICD-10-CM | POA: Diagnosis not present

## 2016-05-03 DIAGNOSIS — M8580 Other specified disorders of bone density and structure, unspecified site: Secondary | ICD-10-CM

## 2016-05-03 DIAGNOSIS — Z833 Family history of diabetes mellitus: Secondary | ICD-10-CM

## 2016-05-03 DIAGNOSIS — Z23 Encounter for immunization: Secondary | ICD-10-CM

## 2016-05-03 DIAGNOSIS — Z853 Personal history of malignant neoplasm of breast: Secondary | ICD-10-CM

## 2016-05-03 DIAGNOSIS — F411 Generalized anxiety disorder: Secondary | ICD-10-CM

## 2016-05-03 LAB — COMPREHENSIVE METABOLIC PANEL
ALT: 24 U/L (ref 0–35)
AST: 27 U/L (ref 0–37)
Albumin: 4.2 g/dL (ref 3.5–5.2)
Alkaline Phosphatase: 75 U/L (ref 39–117)
BUN: 9 mg/dL (ref 6–23)
CHLORIDE: 105 meq/L (ref 96–112)
CO2: 31 meq/L (ref 19–32)
Calcium: 9.1 mg/dL (ref 8.4–10.5)
Creatinine, Ser: 0.67 mg/dL (ref 0.40–1.20)
GFR: 95.03 mL/min (ref >60.00)
Glucose, Bld: 88 mg/dL (ref 70–99)
Potassium: 4.8 mEq/L (ref 3.5–5.1)
Sodium: 143 mEq/L (ref 135–145)
Total Bilirubin: 0.5 mg/dL (ref 0.2–1.2)
Total Protein: 7.2 g/dL (ref 6.0–8.3)

## 2016-05-03 LAB — CBC WITH DIFFERENTIAL/PLATELET
BASOS PCT: 0.5 % (ref 0.0–3.0)
Basophils Absolute: 0 10*3/uL (ref 0.0–0.1)
EOS PCT: 0.7 % (ref 0.0–5.0)
Eosinophils Absolute: 0.1 10*3/uL (ref 0.0–0.7)
HEMATOCRIT: 45.1 % (ref 36.0–46.0)
Hemoglobin: 15.8 g/dL — ABNORMAL HIGH (ref 12.0–15.0)
LYMPHS PCT: 24 % (ref 12.0–46.0)
Lymphs Abs: 1.9 10*3/uL (ref 0.7–4.0)
MCHC: 34.9 g/dL (ref 30.0–36.0)
MCV: 89.9 fl (ref 78.0–100.0)
MONOS PCT: 6.5 % (ref 3.0–12.0)
Monocytes Absolute: 0.5 10*3/uL (ref 0.1–1.0)
NEUTROS ABS: 5.3 10*3/uL (ref 1.4–7.7)
Neutrophils Relative %: 68.3 % (ref 43.0–77.0)
PLATELETS: 248 10*3/uL (ref 150.0–400.0)
RBC: 5.02 Mil/uL (ref 3.87–5.11)
RDW: 12.5 % (ref 11.5–15.5)
WBC: 7.8 10*3/uL (ref 4.0–10.5)

## 2016-05-03 LAB — LIPID PANEL
CHOLESTEROL: 160 mg/dL (ref 0–200)
HDL: 50.5 mg/dL (ref >39.00)
LDL CALC: 80 mg/dL (ref 0–99)
NonHDL: 109.32
Total CHOL/HDL Ratio: 3
Triglycerides: 148 mg/dL (ref 0.0–149.0)
VLDL: 29.6 mg/dL (ref 0.0–40.0)

## 2016-05-03 LAB — HEMOGLOBIN A1C: HEMOGLOBIN A1C: 4.8 % (ref 4.6–6.5)

## 2016-05-03 LAB — TSH: TSH: 1.13 u[IU]/mL (ref 0.35–4.50)

## 2016-05-03 MED ORDER — THERA VITAL M PO TABS: 1.0000 | ORAL_TABLET | Freq: Every day | ORAL | Status: DC

## 2016-05-03 MED ORDER — FLUOXETINE HCL 20 MG PO CAPS: 20.0000 mg | ORAL_CAPSULE | Freq: Every day | ORAL | 3 refills | Status: DC

## 2016-05-03 MED ORDER — LOSARTAN POTASSIUM 50 MG PO TABS: 50.0000 mg | ORAL_TABLET | Freq: Every day | ORAL | 3 refills | Status: DC

## 2016-05-03 NOTE — Assessment & Plan Note (Signed)
dexa up to date Continue calcium and vitamin d Walking regularly 

## 2016-05-03 NOTE — Progress Notes (Signed)
Pre visit review using our clinic review tool, if applicable. No additional management support is needed unless otherwise documented below in the visit note. 

## 2016-05-03 NOTE — Patient Instructions (Addendum)
Test(s) ordered today. Your results will be released to MyChart (or called to you) after review, usually within 72hours after test completion. If any changes need to be made, you will be notified at that same time.  All other Health Maintenance issues reviewed.   All recommended immunizations and age-appropriate screenings are up-to-date or discussed.  Flu vaccine administered today.   Medications reviewed and updated.  No changes recommended at this time.  Your prescription(s) have been submitted to your pharmacy. Please take as directed and contact our office if you believe you are having problem(s) with the medication(s).   Please followup in one year for a physical   Health Maintenance, Female Adopting a healthy lifestyle and getting preventive care can go a long way to promote health and wellness. Talk with your health care provider about what schedule of regular examinations is right for you. This is a good chance for you to check in with your provider about disease prevention and staying healthy. In between checkups, there are plenty of things you can do on your own. Experts have done a lot of research about which lifestyle changes and preventive measures are most likely to keep you healthy. Ask your health care provider for more information. WEIGHT AND DIET  Eat a healthy diet  Be sure to include plenty of vegetables, fruits, low-fat dairy products, and lean protein.  Do not eat a lot of foods high in solid fats, added sugars, or salt.  Get regular exercise. This is one of the most important things you can do for your health.  Most adults should exercise for at least 150 minutes each week. The exercise should increase your heart rate and make you sweat (moderate-intensity exercise).  Most adults should also do strengthening exercises at least twice a week. This is in addition to the moderate-intensity exercise.  Maintain a healthy weight  Body mass index (BMI) is a  measurement that can be used to identify possible weight problems. It estimates body fat based on height and weight. Your health care provider can help determine your BMI and help you achieve or maintain a healthy weight.  For females 20 years of age and older:   A BMI below 18.5 is considered underweight.  A BMI of 18.5 to 24.9 is normal.  A BMI of 25 to 29.9 is considered overweight.  A BMI of 30 and above is considered obese.  Watch levels of cholesterol and blood lipids  You should start having your blood tested for lipids and cholesterol at 61 years of age, then have this test every 5 years.  You may need to have your cholesterol levels checked more often if:  Your lipid or cholesterol levels are high.  You are older than 61 years of age.  You are at high risk for heart disease.  CANCER SCREENING   Lung Cancer  Lung cancer screening is recommended for adults 55-80 years old who are at high risk for lung cancer because of a history of smoking.  A yearly low-dose CT scan of the lungs is recommended for people who:  Currently smoke.  Have quit within the past 15 years.  Have at least a 30-pack-year history of smoking. A pack year is smoking an average of one pack of cigarettes a day for 1 year.  Yearly screening should continue until it has been 15 years since you quit.  Yearly screening should stop if you develop a health problem that would prevent you from having lung cancer treatment.    Breast Cancer  Practice breast self-awareness. This means understanding how your breasts normally appear and feel.  It also means doing regular breast self-exams. Let your health care provider know about any changes, no matter how small.  If you are in your 20s or 30s, you should have a clinical breast exam (CBE) by a health care provider every 1-3 years as part of a regular health exam.  If you are 42 or older, have a CBE every year. Also consider having a breast X-ray  (mammogram) every year.  If you have a family history of breast cancer, talk to your health care provider about genetic screening.  If you are at high risk for breast cancer, talk to your health care provider about having an MRI and a mammogram every year.  Breast cancer gene (BRCA) assessment is recommended for women who have family members with BRCA-related cancers. BRCA-related cancers include:  Breast.  Ovarian.  Tubal.  Peritoneal cancers.  Results of the assessment will determine the need for genetic counseling and BRCA1 and BRCA2 testing. Cervical Cancer Your health care provider may recommend that you be screened regularly for cancer of the pelvic organs (ovaries, uterus, and vagina). This screening involves a pelvic examination, including checking for microscopic changes to the surface of your cervix (Pap test). You may be encouraged to have this screening done every 3 years, beginning at age 58.  For women ages 23-65, health care providers may recommend pelvic exams and Pap testing every 3 years, or they may recommend the Pap and pelvic exam, combined with testing for human papilloma virus (HPV), every 5 years. Some types of HPV increase your risk of cervical cancer. Testing for HPV may also be done on women of any age with unclear Pap test results.  Other health care providers may not recommend any screening for nonpregnant women who are considered low risk for pelvic cancer and who do not have symptoms. Ask your health care provider if a screening pelvic exam is right for you.  If you have had past treatment for cervical cancer or a condition that could lead to cancer, you need Pap tests and screening for cancer for at least 20 years after your treatment. If Pap tests have been discontinued, your risk factors (such as having a new sexual partner) need to be reassessed to determine if screening should resume. Some women have medical problems that increase the chance of getting  cervical cancer. In these cases, your health care provider may recommend more frequent screening and Pap tests. Colorectal Cancer  This type of cancer can be detected and often prevented.  Routine colorectal cancer screening usually begins at 61 years of age and continues through 61 years of age.  Your health care provider may recommend screening at an earlier age if you have risk factors for colon cancer.  Your health care provider may also recommend using home test kits to check for hidden blood in the stool.  A small camera at the end of a tube can be used to examine your colon directly (sigmoidoscopy or colonoscopy). This is done to check for the earliest forms of colorectal cancer.  Routine screening usually begins at age 77.  Direct examination of the colon should be repeated every 5-10 years through 61 years of age. However, you may need to be screened more often if early forms of precancerous polyps or small growths are found. Skin Cancer  Check your skin from head to toe regularly.  Tell your health care  provider about any new moles or changes in moles, especially if there is a change in a mole's shape or color.  Also tell your health care provider if you have a mole that is larger than the size of a pencil eraser.  Always use sunscreen. Apply sunscreen liberally and repeatedly throughout the day.  Protect yourself by wearing long sleeves, pants, a wide-brimmed hat, and sunglasses whenever you are outside. HEART DISEASE, DIABETES, AND HIGH BLOOD PRESSURE   High blood pressure causes heart disease and increases the risk of stroke. High blood pressure is more likely to develop in:  People who have blood pressure in the high end of the normal range (130-139/85-89 mm Hg).  People who are overweight or obese.  People who are African American.  If you are 20-79 years of age, have your blood pressure checked every 3-5 years. If you are 34 years of age or older, have your blood  pressure checked every year. You should have your blood pressure measured twice--once when you are at a hospital or clinic, and once when you are not at a hospital or clinic. Record the average of the two measurements. To check your blood pressure when you are not at a hospital or clinic, you can use:  An automated blood pressure machine at a pharmacy.  A home blood pressure monitor.  If you are between 73 years and 54 years old, ask your health care provider if you should take aspirin to prevent strokes.  Have regular diabetes screenings. This involves taking a blood sample to check your fasting blood sugar level.  If you are at a normal weight and have a low risk for diabetes, have this test once every three years after 61 years of age.  If you are overweight and have a high risk for diabetes, consider being tested at a younger age or more often. PREVENTING INFECTION  Hepatitis B  If you have a higher risk for hepatitis B, you should be screened for this virus. You are considered at high risk for hepatitis B if:  You were born in a country where hepatitis B is common. Ask your health care provider which countries are considered high risk.  Your parents were born in a high-risk country, and you have not been immunized against hepatitis B (hepatitis B vaccine).  You have HIV or AIDS.  You use needles to inject street drugs.  You live with someone who has hepatitis B.  You have had sex with someone who has hepatitis B.  You get hemodialysis treatment.  You take certain medicines for conditions, including cancer, organ transplantation, and autoimmune conditions. Hepatitis C  Blood testing is recommended for:  Everyone born from [birth year] through 1966.  Anyone with known risk factors for hepatitis C. Sexually transmitted infections (STIs)  You should be screened for sexually transmitted infections (STIs) including gonorrhea and chlamydia if:  You are sexually active and are  younger than 61 years of age.  You are older than 61 years of age and your health care provider tells you that you are at risk for this type of infection.  Your sexual activity has changed since you were last screened and you are at an increased risk for chlamydia or gonorrhea. Ask your health care provider if you are at risk.  If you do not have HIV, but are at risk, it may be recommended that you take a prescription medicine daily to prevent HIV infection. This is called pre-exposure prophylaxis (PrEP). You are  considered at risk if:  You are sexually active and do not regularly use condoms or know the HIV status of your partner(s).  You take drugs by injection.  You are sexually active with a partner who has HIV. Talk with your health care provider about whether you are at high risk of being infected with HIV. If you choose to begin PrEP, you should first be tested for HIV. You should then be tested every 3 months for as long as you are taking PrEP.  PREGNANCY   If you are premenopausal and you may become pregnant, ask your health care provider about preconception counseling.  If you may become pregnant, take 400 to 800 micrograms (mcg) of folic acid every day.  If you want to prevent pregnancy, talk to your health care provider about birth control (contraception). OSTEOPOROSIS AND MENOPAUSE   Osteoporosis is a disease in which the bones lose minerals and strength with aging. This can result in serious bone fractures. Your risk for osteoporosis can be identified using a bone density scan.  If you are 69 years of age or older, or if you are at risk for osteoporosis and fractures, ask your health care provider if you should be screened.  Ask your health care provider whether you should take a calcium or vitamin D supplement to lower your risk for osteoporosis.  Menopause may have certain physical symptoms and risks.  Hormone replacement therapy may reduce some of these symptoms and  risks. Talk to your health care provider about whether hormone replacement therapy is right for you.  HOME CARE INSTRUCTIONS   Schedule regular health, dental, and eye exams.  Stay current with your immunizations.   Do not use any tobacco products including cigarettes, chewing tobacco, or electronic cigarettes.  If you are pregnant, do not drink alcohol.  If you are breastfeeding, limit how much and how often you drink alcohol.  Limit alcohol intake to no more than 1 drink per day for nonpregnant women. One drink equals 12 ounces of beer, 5 ounces of wine, or 1 ounces of hard liquor.  Do not use street drugs.  Do not share needles.  Ask your health care provider for help if you need support or information about quitting drugs.  Tell your health care provider if you often feel depressed.  Tell your health care provider if you have ever been abused or do not feel safe at home.   This information is not intended to replace advice given to you by your health care provider. Make sure you discuss any questions you have with your health care provider.   Document Released: 02/07/2011 Document Revised: 08/15/2014 Document Reviewed: 06/26/2013 Elsevier Interactive Patient Education [employer].

## 2016-05-03 NOTE — Assessment & Plan Note (Signed)
Controlled, stable Continue current dose of medication  

## 2016-05-03 NOTE — Progress Notes (Signed)
Subjective:    Patient ID: Gabriela Carrillo, female    DOB: 1955-11-04, 60 y.o.   MRN: [id number]  HPI She is here to establish with a new pcp.  She is here for a physical exam.   He denies changes in history since she was here last.    Hypertension: She is taking her medication daily. She is compliant with a low sodium diet.  She is exercising regularly - walking.  She does monitor her blood pressure at home and it is well controlled.  It is typically high here.    Anxiety: She is taking her medication daily as prescribed. She denies any side effects from the medication. She feels her anxiety is well controlled and she is happy with her current dose of medication.    She is concerned about sugars  - she has a family history of diabetes.   Medications and allergies reviewed with patient and updated if appropriate.  Patient Active Problem List   Diagnosis Date Noted  . Essential hypertension, benign 05/03/2016  . 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 Left ear hearing loss 11/11/2015  . Allergic rhinitis 11/11/2015  . Osteopenia 09/09/2013  . Elevated AST (SGOT) 09/09/2013  . Anxiety state 09/04/2009  . Irritable bowel syndrome 09/04/2009  . Hypopotassemia 08/07/2008  . BREAST CANCER, HX OF 08/07/2008    No current outpatient prescriptions on file prior to visit.   No current facility-administered medications on file prior to visit.     Past Medical History:  Diagnosis Date  . Allergy to sulfa drugs    itching w/o rash  . Cancer (HCC)    BREAST,PMH OF  . Depression    PMH of  . 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 Hypertension    Has been taking Losartan for about a year.  . Hypokalemia    PMH OF    Past Surgical History:  Procedure Laterality Date  . BREAST LUMPECTOMY  2004   CHEMOTHERAPY  FOLLOWED BY RADIATION  . BREAST RECONSTRUCTION Left 12/18/2014   Procedure: DELAYED LEFT BREAST RECONSTRUCTION WITH PLACEMENT OF GEL  IMPLANT;  Surgeon: Crissie Reese, MD;   Location: [facility];  Service: Plastics;  Laterality: Left;  . BUNIONECTOMY BILATERALLY    . CESAREAN SECTION    . COLONOSCOPY  2011   negative  . G3 P2      Social History   Social History  . Marital status: Married    Spouse name: N/A  . Number of children: N/A  . Years of education: N/A   Social History Main Topics  . Smoking status: Never Smoker  . Smokeless tobacco: Never Used  . Alcohol use Yes     Comment:  2-3 per week  . Drug use: No  . Sexual activity: Not Asked   Other Topics Concern  . None   Social History Narrative   WALKS DAILY 40-45 MINS    Family History  Problem Relation Age of Onset  . Diabetes Mother   . Anxiety disorder Mother   . Goiter Mother   . Hypertension Sister     X51  . Arthritis Sister     Rheumatoid   . Cancer Neg Hx   . Heart disease Neg Hx   . Stroke Neg Hx     Review of Systems  Constitutional: Negative for appetite change, chills, fatigue, fever and unexpected weight change.  HENT: Positive for postnasal drip. Negative for hearing loss, sore throat and  tinnitus.   Eyes: Negative for visual disturbance.  Respiratory: Negative for cough, shortness of breath and wheezing.   Cardiovascular: Negative for chest pain, palpitations and leg swelling.  Gastrointestinal: Negative for abdominal pain, blood in stool, constipation, diarrhea and nausea.       No gerd  Endocrine: Negative for polyuria.  Genitourinary: Negative for dysuria and hematuria.  Musculoskeletal: Positive for arthralgias (mild knee knee pain occasionally). Negative for back pain.  Skin: Negative for color change and rash.  Neurological: Negative for dizziness, light-headedness and headaches.  Psychiatric/Behavioral: Negative for dysphoric mood. The patient is nervous/anxious (controlled).        Objective:   Vitals:   05/03/16 0758  BP: (!) 164/94  Pulse: 91  Resp: 16  Temp: 98.3 F (36.8 C)   Filed Weights   05/03/16 0758  Weight: 146 lb (66.2 kg)    Body mass index is 24.67 kg/m.   Physical Exam Constitutional: She appears well-developed and well-nourished. No distress.  HENT:  Head: Normocephalic and atraumatic.  Right Ear: External ear normal. Normal ear canal and TM Left Ear: External ear normal.  Normal ear canal and TM Mouth/Throat: Oropharynx is clear and moist.  Eyes: Conjunctivae and EOM are normal.  Neck: Neck supple. No tracheal deviation present. No thyromegaly present.  No carotid bruit  Cardiovascular: Normal rate, regular rhythm and normal heart sounds.   No murmur heard.  No edema. Pulmonary/Chest: Effort normal and breath sounds normal. No respiratory distress. She has no wheezes. She has no rales.  Breast: deferred to Gyn Abdominal: Soft. She exhibits no distension. There is no tenderness.  Lymphadenopathy: She has no cervical adenopathy.  Skin: Skin is warm and dry. She is not diaphoretic.  Psychiatric: She has a normal mood and affect. Her behavior is normal.         Assessment & Plan:   Physical exam: Screening blood work ordered Immunizations flu vaccine today,discussed shingles vaccine Colonoscopy  Up to date  Mammogram  Up to date  Gyn  Up to date  Dexa  Up to date Eye exams  Up to date  Exercise - walking regularly Weight normal BMI - working on weight loss Skin  - no concerns,no abnormalities seen in exposed skin - discussed skin check by derm Substance abuse  none  See Problem List for Assessment and Plan of chronic medical problems.  F/u annually

## 2016-05-03 NOTE — Assessment & Plan Note (Signed)
Controlled at home, typically elevated here Current regimen effective and well tolerated Continue current medications at current doses

## 2016-05-03 NOTE — Assessment & Plan Note (Signed)
Follows with gyn annually No evidence of recurrence

## 2016-05-04 ENCOUNTER — Encounter: Payer: Self-pay | Admitting: Internal Medicine

## 2016-05-04 ENCOUNTER — Other Ambulatory Visit: Payer: Self-pay | Admitting: Internal Medicine

## 2016-05-25 ENCOUNTER — Encounter

## 2016-05-25 MED ORDER — NYAMYC 100000 UNIT/GM EX POWD
100000 UNIT/GM | CUTANEOUS | 3 refills | Status: DC
Start: 2016-05-25 — End: 2017-09-07

## 2016-05-25 MED ORDER — LISINOPRIL 5 MG PO TABS
5 MG | ORAL_TABLET | ORAL | 0 refills | Status: DC
Start: 2016-05-25 — End: 2016-09-01

## 2016-05-25 MED ORDER — AMLODIPINE BESYLATE 5 MG PO TABS
5 MG | ORAL_TABLET | ORAL | 0 refills | Status: DC
Start: 2016-05-25 — End: 2016-09-01

## 2016-05-25 NOTE — Telephone Encounter (Signed)
Patient last seen on 01/15/16. Advised to follow up 6 months. Next appointment none.

## 2016-05-31 ENCOUNTER — Other Ambulatory Visit: Payer: Self-pay | Admitting: Internal Medicine

## 2016-05-31 DIAGNOSIS — Z1231 Encounter for screening mammogram for malignant neoplasm of breast: Secondary | ICD-10-CM

## 2016-06-10 ENCOUNTER — Encounter

## 2016-06-10 MED ORDER — ZOLPIDEM TARTRATE 10 MG PO TABS
10 | ORAL_TABLET | Freq: Every evening | ORAL | 0 refills | Status: DC | PRN
Start: 2016-06-10 — End: 2016-09-01

## 2016-06-10 NOTE — Telephone Encounter (Signed)
Patient last seen on 01/15/16. Advised to follow up 6 months. Next appointment none.       OARRS 06/10/16

## 2016-06-22 ENCOUNTER — Ambulatory Visit
Admission: RE | Admit: 2016-06-22 | Discharge: 2016-06-22 | Disposition: A | Discharge status: Home / Self Care | Payer: 59 | Source: Ambulatory Visit | Attending: Internal Medicine | Admitting: Internal Medicine

## 2016-06-22 DIAGNOSIS — Z1231 Encounter for screening mammogram for malignant neoplasm of breast: Secondary | ICD-10-CM

## 2016-08-02 ENCOUNTER — Encounter

## 2016-08-02 NOTE — Telephone Encounter (Signed)
Patient will call back to make appt when she knows her schedule

## 2016-08-02 NOTE — Telephone Encounter (Signed)
Last seen 01/15/16. Told to up in 6 months.  No fu on file

## 2016-08-23 ENCOUNTER — Encounter

## 2016-08-23 NOTE — Telephone Encounter (Signed)
Patient last seen on 01/15/16. Advised to follow up 6 months. Next appointment none.

## 2016-08-23 NOTE — Telephone Encounter (Signed)
Appointment made.

## 2016-09-01 ENCOUNTER — Ambulatory Visit: Admit: 2016-09-01 | Discharge: 2016-09-01 | Payer: PRIVATE HEALTH INSURANCE | Attending: Family Medicine

## 2016-09-01 DIAGNOSIS — I10 Essential (primary) hypertension: Secondary | ICD-10-CM

## 2016-09-01 LAB — COMPREHENSIVE METABOLIC PANEL
ALT: 9 U/L — ABNORMAL LOW (ref 10–40)
AST: 20 U/L (ref 15–37)
Albumin/Globulin Ratio: 2 (ref 1.1–2.2)
Albumin: 4.9 g/dL (ref 3.4–5.0)
Alkaline Phosphatase: 71 U/L (ref 40–129)
Anion Gap: 15 (ref 3–16)
BUN: 12 mg/dL (ref 7–20)
CO2: 24 mmol/L (ref 21–32)
Calcium: 10.3 mg/dL (ref 8.3–10.6)
Chloride: 104 mmol/L (ref 99–110)
Creatinine: 0.6 mg/dL (ref 0.6–1.2)
GFR African American: >60 (ref >60)
GFR Non-African American: >60 (ref >60)
Globulin: 2.4 g/dL
Glucose: 90 mg/dL (ref 70–99)
Potassium: 4.5 mmol/L (ref 3.5–5.1)
Sodium: 143 mmol/L (ref 136–145)
Total Bilirubin: 0.4 mg/dL (ref 0.0–1.0)
Total Protein: 7.3 g/dL (ref 6.4–8.2)

## 2016-09-01 LAB — LIPID PANEL
Cholesterol, Total: 260 mg/dL — ABNORMAL HIGH (ref 0–199)
HDL: 65 mg/dL — ABNORMAL HIGH (ref 40–60)
LDL Calculated: 147 mg/dL — ABNORMAL HIGH (ref <100)
Triglycerides: 239 mg/dL — ABNORMAL HIGH (ref 0–150)
VLDL Cholesterol Calculated: 48 mg/dL

## 2016-09-01 MED ORDER — LISINOPRIL 5 MG PO TABS
5 | ORAL_TABLET | ORAL | 1 refills | Status: DC
Start: 2016-09-01 — End: 2017-02-15

## 2016-09-01 MED ORDER — HYDROXYZINE HCL 50 MG PO TABS
50 | ORAL_TABLET | ORAL | 1 refills | Status: DC
Start: 2016-09-01 — End: 2017-02-15

## 2016-09-01 MED ORDER — CITALOPRAM HYDROBROMIDE 40 MG PO TABS
40 | ORAL_TABLET | ORAL | 1 refills | Status: DC
Start: 2016-09-01 — End: 2017-02-15

## 2016-09-01 MED ORDER — AMLODIPINE BESYLATE 5 MG PO TABS
5 | ORAL_TABLET | ORAL | 1 refills | Status: DC
Start: 2016-09-01 — End: 2017-02-15

## 2016-09-01 MED ORDER — ZOLPIDEM TARTRATE 5 MG PO TABS
5 | ORAL_TABLET | Freq: Every evening | ORAL | 1 refills | Status: DC | PRN
Start: 2016-09-01 — End: 2017-02-15

## 2016-09-01 NOTE — Progress Notes (Signed)
Subjective:      Patient ID: Cassandra Copeland is a 61 y.o. female.    HPI  Chief Complaint   Patient presents with   ??? Hypertension-Denies cv/cns/claud sx      patient is not fasting    ??? Anxiety-contrrolled   ??? Immunizations     wants to know if she needs the pneumonia vaccine    ??? Skin Lesion     states there is a spot on her upper left forehead that has been there a year, it is itchy and scaly      Chief complaint present illness: 61 year old white female presents unaccompanied for follow-up RE anxiety and hypertension, has some questions regarding immunizations, and has noticed over the last 6 months to 1 year a small irritated area on her left forehead near the hairline.    Review of Systems   Constitutional: Negative.    Respiratory: Negative.        Objective:   Physical Exam   Constitutional: She appears well-developed and well-nourished.   HENT:   Head: Normocephalic.       Eyes: Conjunctivae are normal.   Neck: Neck supple. Carotid bruit is not present.   Cardiovascular: Normal rate, regular rhythm and normal heart sounds.    Pulmonary/Chest: Effort normal and breath sounds normal. She has no wheezes. She has no rales.   Lymphadenopathy:     She has no cervical adenopathy.   Skin: Skin is warm. No rash noted.   Psychiatric: She has a normal mood and affect. Her behavior is normal. Thought content normal.   Nursing note and vitals reviewed.    BP 126/84    Pulse 59    Temp 97.6 ??F (36.4 ??C) (Oral)    Ht 5\' 3"  (1.6 m)    Wt 193 lb (87.5 kg)    SpO2 99%    Breastfeeding? No    BMI 34.19 kg/m??     Assessment:      Cassandra Copeland was seen today for hypertension, anxiety, immunizations and skin lesion.    Diagnoses and all orders for this visit:    Essential hypertension  -     amLODIPine (NORVASC) 5 MG tablet; TAKE 1 TABLET DAILY  -     lisinopril (PRINIVIL;ZESTRIL) 5 MG tablet; TAKE 1 TABLET DAILY  -     Lipid Panel  -     Comprehensive Metabolic Panel    Fibromyalgia  -     citalopram (CELEXA) 40 MG tablet; TAKE 1 TABLET  DAILY  -     hydrOXYzine (ATARAX) 50 MG tablet; TAKE 1 TABLET EVERY 8 HOURS AS NEEDED FOR ITCHING    Depression, unspecified depression type  -     citalopram (CELEXA) 40 MG tablet; TAKE 1 TABLET DAILY    Primary insomnia  -     zolpidem (AMBIEN) 5 MG tablet; Take 1 tablet by mouth nightly as needed for Sleep for up to 90 days.    Mild intermittent asthma without complication    Need for prophylactic vaccination against Streptococcus pneumoniae (pneumococcus)  -     Pneumococcal polysaccharide vaccine 23-valent >= 2yo subcutaneous/IM (PNEUMOVAX 23)    Cancer of the skin, basal cell  -     External Referral To Dermatology           Plan:      Return in about 6 months (around 03/01/2017).  There are no Patient Instructions on file for this visit.

## 2016-09-01 NOTE — Progress Notes (Signed)
Lab preformed in [facility] and sent number of tubes below to be processed.   1 attempt made and stopped bleeding by applying band aide and guaze.     1 Red    1 purple    0 blue    0 Urine

## 2016-11-16 ENCOUNTER — Telehealth

## 2016-11-16 MED ORDER — ATORVASTATIN CALCIUM 20 MG PO TABS
20 MG | ORAL_TABLET | Freq: Every day | ORAL | 1 refills | Status: DC
Start: 2016-11-16 — End: 2017-02-15

## 2016-11-16 NOTE — Telephone Encounter (Signed)
It appears PCP wanted pt to start Atorvastatin 20mg   back on 09/01/16 but Victorino Dike was filling in and never sent medication (she isn't use to sending in medications) [facility] Dr. Salome Spotted wanted pt to return in 6 months to see him and be fasting from 09/01/16       I Lm on pt home VM to call office [phone]

## 2016-11-16 NOTE — Telephone Encounter (Signed)
Patient calling because she remembered Dr. Salome Spotted wanted her to start cholesterol medication, but never received anything in the mail. If still valid, [facility]

## 2016-11-16 NOTE — Telephone Encounter (Signed)
Pt informed of message. I told her what happened and she said she wondered why she never got the medications

## 2016-11-22 ENCOUNTER — Encounter: Payer: Self-pay | Admitting: Gastroenterology

## 2016-11-27 ENCOUNTER — Telehealth: Payer: Self-pay | Admitting: Emergency Medicine

## 2016-12-26 NOTE — Telephone Encounter (Signed)
Calling patient to see if they had a colonoscopy or if they would like to do a FIT test. Patient will do the FIT test

## 2017-01-06 NOTE — Telephone Encounter (Signed)
ERROR

## 2017-01-25 ENCOUNTER — Encounter

## 2017-01-27 ENCOUNTER — Encounter: Payer: Self-pay | Admitting: Gastroenterology

## 2017-02-15 ENCOUNTER — Ambulatory Visit: Admit: 2017-02-15 | Discharge: 2017-02-15 | Payer: PRIVATE HEALTH INSURANCE | Attending: Family Medicine

## 2017-02-15 DIAGNOSIS — I10 Essential (primary) hypertension: Secondary | ICD-10-CM

## 2017-02-15 MED ORDER — HYDROXYZINE HCL 50 MG PO TABS
50 | ORAL_TABLET | ORAL | 1 refills | Status: DC
Start: 2017-02-15 — End: 2017-06-15

## 2017-02-15 MED ORDER — ATORVASTATIN CALCIUM 20 MG PO TABS
20 | ORAL_TABLET | Freq: Every day | ORAL | 1 refills | Status: DC
Start: 2017-02-15 — End: 2017-06-15

## 2017-02-15 MED ORDER — TRAMADOL HCL 50 MG PO TABS
50 MG | ORAL_TABLET | Freq: Four times a day (QID) | ORAL | 0 refills | Status: AC | PRN
Start: 2017-02-15 — End: 2017-03-17

## 2017-02-15 MED ORDER — CYCLOBENZAPRINE HCL 10 MG PO TABS
10 | ORAL_TABLET | Freq: Every evening | ORAL | 0 refills | Status: DC
Start: 2017-02-15 — End: 2017-05-07

## 2017-02-15 MED ORDER — CITALOPRAM HYDROBROMIDE 40 MG PO TABS
40 | ORAL_TABLET | ORAL | 1 refills | Status: DC
Start: 2017-02-15 — End: 2017-09-07

## 2017-02-15 MED ORDER — MONTELUKAST SODIUM 10 MG PO TABS
10 | ORAL_TABLET | ORAL | 3 refills | Status: DC
Start: 2017-02-15 — End: 2018-02-12

## 2017-02-15 MED ORDER — LORAZEPAM 1 MG PO TABS
1 | ORAL_TABLET | Freq: Four times a day (QID) | ORAL | 1 refills | Status: AC | PRN
Start: 2017-02-15 — End: 2017-03-17

## 2017-02-15 MED ORDER — ZOLPIDEM TARTRATE 5 MG PO TABS
5 | ORAL_TABLET | Freq: Every evening | ORAL | 1 refills | Status: DC | PRN
Start: 2017-02-15 — End: 2017-09-07

## 2017-02-15 MED ORDER — MELOXICAM 15 MG PO TABS
15 | ORAL_TABLET | ORAL | 1 refills | Status: DC
Start: 2017-02-15 — End: 2017-08-14

## 2017-02-15 MED ORDER — LISINOPRIL 5 MG PO TABS
5 | ORAL_TABLET | ORAL | 1 refills | Status: DC
Start: 2017-02-15 — End: 2017-06-15

## 2017-02-15 MED ORDER — AMLODIPINE BESYLATE 5 MG PO TABS
5 | ORAL_TABLET | ORAL | 1 refills | Status: DC
Start: 2017-02-15 — End: 2017-06-15

## 2017-02-15 NOTE — Progress Notes (Signed)
Subjective:      Patient ID: Cassandra Copeland is a 61 y.o. female.    HPI  Chief Complaint   Patient presents with   ??? Hypertension-Denies cv/cns/claud sx    ??? Depression-On citalopram for anxiety and depression primarily anxiety.  Is worried about her trip out to [place]???doesn't like to feel fly.  Also worried about how anxious sugar while she is watching her father died from prostate cancer.  Issues discussed at length      they have given her dad 2 weeks left to live    ??? Anxiety???See above      she is going to be flying and wants to make her nerves are ok during it    ??? Insomnia???See above;      Chief complaint and present illness: 61 year old white female presents unaccompanied primarily because of the flying out to [place] but it's time for her regular visit???hypertension hyperlipidemia back pain insomnia etc.  Everything seems stable    Review of Systems   All other systems reviewed and are negative.    background/entire past medical,social and family history obtained and reviewed/updated today   Objective:   Physical Exam   Constitutional: She appears well-developed and well-nourished. No distress.   Eyes: Conjunctivae are normal. No scleral icterus.   Neck: Neck supple. Carotid bruit is not present. No thyromegaly present.   Cardiovascular: Normal rate, regular rhythm, normal heart sounds and intact distal pulses.    Pulmonary/Chest: Effort normal and breath sounds normal. She has no wheezes. She has no rales.   Musculoskeletal: She exhibits no tenderness.   Lymphadenopathy:     She has no cervical adenopathy.   Skin: Skin is warm. No pallor.   Psychiatric: She has a normal mood and affect. Her behavior is normal.   Nursing note and vitals reviewed.   BP 116/74    Pulse 79    Ht 5\' 3"  (1.6 m)    Wt 196 lb 6.4 oz (89.1 kg)    SpO2 95%    BMI 34.79 kg/m??       Assessment:      Cassandra Copeland was seen today for hypertension, depression, anxiety and insomnia.    Diagnoses and all orders for this visit:    Essential  hypertension  -     lisinopril (PRINIVIL;ZESTRIL) 5 MG tablet; TAKE 1 TABLET DAILY  -     amLODIPine (NORVASC) 5 MG tablet; TAKE 1 TABLET DAILY    Primary insomnia  -     zolpidem (AMBIEN) 5 MG tablet; Take 1 tablet by mouth nightly as needed for Sleep for up to 90 days..    Fibromyalgia  -     meloxicam (MOBIC) 15 MG tablet; TAKE 1 TABLET DAILY  -     cyclobenzaprine (FLEXERIL) 10 MG tablet; Take 1 tablet by mouth nightly  -     hydrOXYzine (ATARAX) 50 MG tablet; TAKE 1 TABLET EVERY 8 HOURS AS NEEDED FOR ITCHING  -     citalopram (CELEXA) 40 MG tablet; TAKE 1 TABLET DAILY  -     traMADol (ULTRAM) 50 MG tablet; Take 1 tablet by mouth every 6 hours as needed for Pain (this is for back pain.) for up to 30 days. Intended supply: 3 days. Take lowest dose possible to manage pain.    Depression, unspecified depression type  -     citalopram (CELEXA) 40 MG tablet; TAKE 1 TABLET DAILY    Fear of flying  -  LORazepam (ATIVAN) 1 MG tablet; Take 1 tablet by mouth every 6 hours as needed for Anxiety for up to 30 days..    Mixed hyperlipidemia  -     Lipid Panel    Other orders  -     montelukast (SINGULAIR) 10 MG tablet; TAKE 1 TABLET NIGHTLY  -     atorvastatin (LIPITOR) 20 MG tablet; Take 1 tablet by mouth daily           Plan:      Return in about 6 months (around 08/18/2017).  There are no Patient Instructions on file for this visit.

## 2017-02-16 LAB — LIPID PANEL
Cholesterol, Total: 164 mg/dL (ref 0–199)
HDL: 57 mg/dL (ref 40–60)
LDL Calculated: 68 mg/dL (ref <100)
Triglycerides: 194 mg/dL — ABNORMAL HIGH (ref 0–150)
VLDL Cholesterol Calculated: 39 mg/dL

## 2017-02-27 ENCOUNTER — Ambulatory Visit: Admit: 2017-02-27 | Discharge: 2017-02-27 | Payer: PRIVATE HEALTH INSURANCE | Attending: Family Medicine

## 2017-02-27 DIAGNOSIS — L0390 Cellulitis, unspecified: Secondary | ICD-10-CM

## 2017-02-27 MED ORDER — SULFAMETHOXAZOLE-TRIMETHOPRIM 800-160 MG PO TABS
800-160 | ORAL_TABLET | Freq: Two times a day (BID) | ORAL | 0 refills | Status: AC
Start: 2017-02-27 — End: 2017-03-09

## 2017-02-27 NOTE — Progress Notes (Signed)
Subjective:      Patient ID: Clarita CraneJoanna Brookens is a 61 y.o. female.    HPI  Chief Complaint   Patient presents with   . Cyst     in the groin area on the left side, very painful and swollen, started on 7/21     Chief complaint and present illness: 61 year old white female presents unaccompanied with a 2 to three-day history of an irritated left labial area.  She is worried this will develop into a bad cellulitis requiring hospitalization-please see 2013.  Patient denies any prior history of herpes or any recent be concerned that she's had herpes.  Patient denies fever sweats chills or vaginal discharge.    Review of Systems   Constitutional: Negative.    Gastrointestinal: Negative.    Genitourinary: Positive for vaginal pain. Negative for flank pain, frequency, hematuria, pelvic pain, vaginal bleeding and vaginal discharge.     background/entire past medical,social and family history obtained and reviewed/updated today   Objective:   Physical Exam   Constitutional: She appears well-developed and well-nourished.   Genitourinary:         Nursing note and vitals reviewed.    BP 114/80   Pulse 78   Ht 5\' 3"  (1.6 m)   Wt 197 lb (89.4 kg)   SpO2 99%   Breastfeeding? No   BMI 34.90 kg/m     Assessment:      [name] was seen today for cyst.    Diagnoses and all orders for this visit:    Cellulitis, unspecified cellulitis site  -     sulfamethoxazole-trimethoprim (BACTRIM DS) 800-160 MG per tablet; Take 1 tablet by mouth 2 times daily for 10 days           Plan:      Return if symptoms worsen or fail to improve.  Patient Instructions   Discussion held RE possibility of herpes.  Patient is to call if increasing problems.

## 2017-02-27 NOTE — Patient Instructions (Signed)
Discussion held RE possibility of herpes.  Patient is to call if increasing problems.

## 2017-03-01 DIAGNOSIS — Z01419 Encounter for gynecological examination (general) (routine) without abnormal findings: Secondary | ICD-10-CM | POA: Diagnosis not present

## 2017-03-01 LAB — HM PAP SMEAR: HM Pap smear: NEGATIVE

## 2017-03-11 ENCOUNTER — Other Ambulatory Visit: Payer: Self-pay | Admitting: Internal Medicine

## 2017-03-14 ENCOUNTER — Ambulatory Visit (AMBULATORY_SURGERY_CENTER): Payer: Self-pay

## 2017-03-14 VITALS — Ht 64.75 in | Wt 149.4 lb

## 2017-03-14 DIAGNOSIS — Z1211 Encounter for screening for malignant neoplasm of colon: Secondary | ICD-10-CM

## 2017-03-14 MED ORDER — SUPREP BOWEL PREP KIT 17.5-3.13-1.6 GM/177ML PO SOLN: 1.0000 | Freq: Once | ORAL | 0 refills | Status: AC

## 2017-03-14 NOTE — Progress Notes (Signed)
No allergies to eggs or soy No diet meds No home oxygen No past problems with anesthesia  Registered emmi 

## 2017-03-20 ENCOUNTER — Encounter: Payer: Self-pay | Admitting: Gastroenterology

## 2017-03-28 ENCOUNTER — Ambulatory Visit (AMBULATORY_SURGERY_CENTER): Payer: 59 | Admitting: Gastroenterology

## 2017-03-28 ENCOUNTER — Encounter: Payer: Self-pay | Admitting: Gastroenterology

## 2017-03-28 VITALS — BP 126/62 | HR 70 | Temp 99.5°F | Resp 11 | Ht 64.0 in | Wt 149.0 lb

## 2017-03-28 DIAGNOSIS — Z1212 Encounter for screening for malignant neoplasm of rectum: Secondary | ICD-10-CM | POA: Diagnosis not present

## 2017-03-28 DIAGNOSIS — D122 Benign neoplasm of ascending colon: Secondary | ICD-10-CM | POA: Diagnosis not present

## 2017-03-28 DIAGNOSIS — Z1211 Encounter for screening for malignant neoplasm of colon: Secondary | ICD-10-CM

## 2017-03-28 MED ORDER — SODIUM CHLORIDE 0.9 % IV SOLN: 500.0000 mL | INTRAVENOUS | Status: DC

## 2017-03-28 NOTE — Progress Notes (Signed)
Spontaneous respirations throughout. VSS. Resting comfortably. To PACU on room air. Report to  RN. 

## 2017-03-28 NOTE — Patient Instructions (Signed)
Handouts given : Polyps and Diverticulosis.  YOU HAD AN ENDOSCOPIC PROCEDURE TODAY AT THE [facility]:   Refer to the procedure report that was given to you for any specific questions about what was found during the examination.  If the procedure report does not answer your questions, please call your gastroenterologist to clarify.  If you requested that your care partner not be given the details of your procedure findings, then the procedure report has been included in a sealed envelope for you to review at your convenience later.  YOU SHOULD EXPECT: Some feelings of bloating in the abdomen. Passage of more gas than usual.  Walking can help get rid of the air that was put into your GI tract during the procedure and reduce the bloating. If you had a lower endoscopy (such as a colonoscopy or flexible sigmoidoscopy) you may notice spotting of blood in your stool or on the toilet paper. If you underwent a bowel prep for your procedure, you may not have a normal bowel movement for a few days.  Please Note:  You might notice some irritation and congestion in your nose or some drainage.  This is from the oxygen used during your procedure.  There is no need for concern and it should clear up in a day or so.  SYMPTOMS TO REPORT IMMEDIATELY:   Following lower endoscopy (colonoscopy or flexible sigmoidoscopy):  Excessive amounts of blood in the stool  Significant tenderness or worsening of abdominal pains  Swelling of the abdomen that is new, acute  Fever of 100F or higher    For urgent or emergent issues, a gastroenterologist can be reached at any hour by calling [phone].   DIET:  We do recommend a small meal at first, but then you may proceed to your regular diet.  Drink plenty of fluids but you should avoid alcoholic beverages for 24 hours.  ACTIVITY:  You should plan to take it easy for the rest of today and you should NOT DRIVE or use heavy machinery until tomorrow (because of  the sedation medicines used during the test).    FOLLOW UP: Our staff will call the number listed on your records the next business day following your procedure to check on you and address any questions or concerns that you may have regarding the information given to you following your procedure. If we do not reach you, we will leave a message.  However, if you are feeling well and you are not experiencing any problems, there is no need to return our call.  We will assume that you have returned to your regular daily activities without incident.  If any biopsies were taken you will be contacted by phone or by letter within the next 1-3 weeks.  Please call us at [phone] if you have not heard about the biopsies in 3 weeks.    SIGNATURES/CONFIDENTIALITY: You and/or your care partner have signed paperwork which will be entered into your electronic medical record.  These signatures attest to the fact that that the information above on your After Visit Summary has been reviewed and is understood.  Full responsibility of the confidentiality of this discharge information lies with you and/or your care-partner. 

## 2017-03-28 NOTE — Op Note (Signed)
[facility] Patient Name: Gabriela Carrillo Procedure Date: 03/28/2017 11:51 AM MRN: [id number] Endoscopist: Mauri Pole , MD Age: 61 Referring MD:  Date of Birth: 1955-10-18 Gender: Female Account #: [id number] Procedure:                Colonoscopy Indications:              Screening for colorectal malignant neoplasm, Last                            colonoscopy: 2008 Medicines:                Monitored Anesthesia Care Procedure:                Pre-Anesthesia Assessment:                           - Prior to the procedure, a History and Physical                            was performed, and patient medications and                            allergies were reviewed. The patient's tolerance of                            previous anesthesia was also reviewed. The risks                            and benefits of the procedure and the sedation                            options and risks were discussed with the patient.                            All questions were answered, and informed consent                            was obtained. Prior Anticoagulants: The patient has                            taken no previous anticoagulant or antiplatelet                            agents. ASA Grade Assessment: II - A patient with                            mild systemic disease. After reviewing the risks                            and benefits, the patient was deemed in                            satisfactory condition to undergo the procedure.  After obtaining informed consent, the colonoscope                            was passed under direct vision. Throughout the                            procedure, the patient's blood pressure, pulse, and                            oxygen saturations were monitored continuously. The                            Model PCF-H190DL [id number]) scope was introduced                            through the anus and advanced to the  the cecum,                            identified by appendiceal orifice and ileocecal                            valve. The colonoscopy was performed without                            difficulty. The patient tolerated the procedure                            well. The quality of the bowel preparation was                            excellent. The ileocecal valve, appendiceal                            orifice, and rectum were photographed. Scope In: 12:04:15 PM Scope Out: 12:18:34 PM Scope Withdrawal Time: 0 hours 8 minutes 59 seconds  Total Procedure Duration: 0 hours 14 minutes 19 seconds  Findings:                 The perianal and digital rectal examinations were                            normal.                           A 1 mm polyp was found in the ascending colon. The                            polyp was sessile. The polyp was removed with a                            cold biopsy forceps. Resection and retrieval were                            complete.  A scattered area of moderately friable mucosa with                            spontaneous bleeding was found at the splenic                            flexure, in the transverse colon, at the hepatic                            flexure, in the ascending colon and in the cecum.                            ?prep related vs barotrauma                           A few small-mouthed diverticula were found in the                            sigmoid colon. Narrow small rectal vault unable to                            retroflex in the rectum.                           The exam was otherwise without abnormality. Complications:            No immediate complications. Estimated Blood Loss:     Estimated blood loss was minimal. Impression:               - One 1 mm polyp in the ascending colon, removed                            with a cold biopsy forceps. Resected and retrieved.                           - Friability  with spontaneous bleeding at the                            splenic flexure, in the transverse colon, at the                            hepatic flexure, in the ascending colon and in the                            cecum.                           - Diverticulosis in the sigmoid colon.                           - The examination was otherwise normal. Recommendation:           - Patient has a contact number available for  emergencies. The signs and symptoms of potential                            delayed complications were discussed with the                            patient. Return to normal activities tomorrow.                            Written discharge instructions were provided to the                            patient.                           - Resume previous diet.                           - Continue present medications.                           - Await pathology results.                           - Repeat colonoscopy in 5-10 years for surveillance                            based on pathology results. Mauri Pole, MD 03/28/2017 12:24:46 PM This report has been signed electronically.

## 2017-03-28 NOTE — Progress Notes (Signed)
Called to room to assist during endoscopic procedure.  Patient ID and intended procedure confirmed with present staff. Received instructions for my participation in the procedure from the performing physician.  

## 2017-03-29 ENCOUNTER — Telehealth: Payer: Self-pay | Admitting: *Deleted

## 2017-03-29 NOTE — Telephone Encounter (Signed)
  Follow up Call-  Call back number 03/28/2017  Post procedure Call Back phone  # [phone]  Permission to leave phone message Yes  Some recent data might be hidden     Patient questions:  Do you have a fever, pain , or abdominal swelling? No. Pain Score  0 *  Have you tolerated food without any problems? Yes.    Have you been able to return to your normal activities? Yes.    Do you have any questions about your discharge instructions: Diet   No. Medications  No. Follow up visit  No.  Do you have questions or concerns about your Care? No.  Actions: * If pain score is 4 or above: No action needed, pain <4.

## 2017-04-01 ENCOUNTER — Encounter: Payer: Self-pay | Admitting: Gastroenterology

## 2017-04-03 ENCOUNTER — Encounter: Payer: Self-pay | Admitting: Internal Medicine

## 2017-04-12 ENCOUNTER — Other Ambulatory Visit: Payer: Self-pay | Admitting: Internal Medicine

## 2017-04-17 ENCOUNTER — Telehealth: Payer: Self-pay | Admitting: Internal Medicine

## 2017-04-17 MED ORDER — FLUOXETINE HCL 20 MG PO CAPS: 20.0000 mg | ORAL_CAPSULE | Freq: Every day | ORAL | 0 refills | Status: DC

## 2017-04-17 MED ORDER — LOSARTAN POTASSIUM 50 MG PO TABS: 50.0000 mg | ORAL_TABLET | Freq: Every day | ORAL | 0 refills | Status: DC

## 2017-04-17 NOTE — Telephone Encounter (Signed)
Per office policy sent 30 day to local pharmacy until appt.../lmb  

## 2017-04-17 NOTE — Telephone Encounter (Signed)
Patient has scheduled CPE for 10/2. Please send refills on fluoxetine and losartan to [facility] [address] off [address].

## 2017-05-07 ENCOUNTER — Encounter

## 2017-05-08 MED ORDER — CYCLOBENZAPRINE HCL 10 MG PO TABS
10 MG | ORAL_TABLET | ORAL | 0 refills | Status: DC
Start: 2017-05-08 — End: 2017-09-07

## 2017-05-08 NOTE — Telephone Encounter (Signed)
Last seen 02/15/17, told to fu in 6 months, appt 05/19/17.

## 2017-05-09 ENCOUNTER — Encounter: Payer: Self-pay | Admitting: Internal Medicine

## 2017-05-09 ENCOUNTER — Ambulatory Visit (INDEPENDENT_AMBULATORY_CARE_PROVIDER_SITE_OTHER): Payer: 59 | Admitting: Internal Medicine

## 2017-05-09 ENCOUNTER — Other Ambulatory Visit (INDEPENDENT_AMBULATORY_CARE_PROVIDER_SITE_OTHER): Payer: 59

## 2017-05-09 VITALS — BP 142/90 | HR 74 | Temp 98.2°F | Resp 16 | Ht 64.0 in | Wt 149.0 lb

## 2017-05-09 DIAGNOSIS — Z23 Encounter for immunization: Secondary | ICD-10-CM | POA: Diagnosis not present

## 2017-05-09 DIAGNOSIS — I10 Essential (primary) hypertension: Secondary | ICD-10-CM | POA: Diagnosis not present

## 2017-05-09 DIAGNOSIS — Z1283 Encounter for screening for malignant neoplasm of skin: Secondary | ICD-10-CM | POA: Diagnosis not present

## 2017-05-09 DIAGNOSIS — Z0000 Encounter for general adult medical examination without abnormal findings: Secondary | ICD-10-CM

## 2017-05-09 DIAGNOSIS — M85852 Other specified disorders of bone density and structure, left thigh: Secondary | ICD-10-CM | POA: Diagnosis not present

## 2017-05-09 DIAGNOSIS — Z853 Personal history of malignant neoplasm of breast: Secondary | ICD-10-CM | POA: Diagnosis not present

## 2017-05-09 DIAGNOSIS — F411 Generalized anxiety disorder: Secondary | ICD-10-CM | POA: Diagnosis not present

## 2017-05-09 LAB — CBC WITH DIFFERENTIAL/PLATELET
BASOS PCT: 0.7 % (ref 0.0–3.0)
Basophils Absolute: 0 10*3/uL (ref 0.0–0.1)
EOS PCT: 0.9 % (ref 0.0–5.0)
Eosinophils Absolute: 0.1 10*3/uL (ref 0.0–0.7)
HEMATOCRIT: 43.8 % (ref 36.0–46.0)
HEMOGLOBIN: 15.2 g/dL — AB (ref 12.0–15.0)
LYMPHS PCT: 30 % (ref 12.0–46.0)
Lymphs Abs: 1.9 10*3/uL (ref 0.7–4.0)
MCHC: 34.7 g/dL (ref 30.0–36.0)
MCV: 91.3 fl (ref 78.0–100.0)
Monocytes Absolute: 0.5 10*3/uL (ref 0.1–1.0)
Monocytes Relative: 7.6 % (ref 3.0–12.0)
Neutro Abs: 3.8 10*3/uL (ref 1.4–7.7)
Neutrophils Relative %: 60.8 % (ref 43.0–77.0)
Platelets: 246 10*3/uL (ref 150.0–400.0)
RBC: 4.8 Mil/uL (ref 3.87–5.11)
RDW: 12.1 % (ref 11.5–15.5)
WBC: 6.2 10*3/uL (ref 4.0–10.5)

## 2017-05-09 LAB — COMPREHENSIVE METABOLIC PANEL
ALBUMIN: 4.5 g/dL (ref 3.5–5.2)
ALK PHOS: 69 U/L (ref 39–117)
ALT: 22 U/L (ref 0–35)
AST: 23 U/L (ref 0–37)
BUN: 8 mg/dL (ref 6–23)
CALCIUM: 9.9 mg/dL (ref 8.4–10.5)
CHLORIDE: 102 meq/L (ref 96–112)
CO2: 32 mEq/L (ref 19–32)
Creatinine, Ser: 0.62 mg/dL (ref 0.40–1.20)
GFR: 103.59 mL/min (ref >60.00)
Glucose, Bld: 89 mg/dL (ref 70–99)
POTASSIUM: 3.5 meq/L (ref 3.5–5.1)
SODIUM: 140 meq/L (ref 135–145)
TOTAL PROTEIN: 7.4 g/dL (ref 6.0–8.3)
Total Bilirubin: 0.7 mg/dL (ref 0.2–1.2)

## 2017-05-09 LAB — LIPID PANEL
CHOLESTEROL: 170 mg/dL (ref 0–200)
HDL: 50.2 mg/dL (ref >39.00)
LDL CALC: 94 mg/dL (ref 0–99)
NonHDL: 119.42
TRIGLYCERIDES: 126 mg/dL (ref 0.0–149.0)
Total CHOL/HDL Ratio: 3
VLDL: 25.2 mg/dL (ref 0.0–40.0)

## 2017-05-09 LAB — TSH: TSH: 1.29 u[IU]/mL (ref 0.35–4.50)

## 2017-05-09 MED ORDER — LOSARTAN POTASSIUM 50 MG PO TABS: 50.0000 mg | ORAL_TABLET | Freq: Every day | ORAL | 5 refills | Status: DC

## 2017-05-09 MED ORDER — FLUOXETINE HCL 20 MG PO CAPS: 20.0000 mg | ORAL_CAPSULE | Freq: Every day | ORAL | 5 refills | Status: DC

## 2017-05-09 NOTE — Assessment & Plan Note (Signed)
Follows with gyn

## 2017-05-09 NOTE — Assessment & Plan Note (Signed)
Bp well controlled at home 113/77 - 140/88 Current regimen effective and well tolerated Continue current medications at current doses cmp

## 2017-05-09 NOTE — Assessment & Plan Note (Addendum)
Controlled, stable Continue current dose of medication Has bad dreams frequently - does not want to make any changes now

## 2017-05-09 NOTE — Assessment & Plan Note (Signed)
Managed by gyn  - will have a dexa soon Stressed regular walking/exercise Taking calcium/vitamin d

## 2017-05-09 NOTE — Progress Notes (Signed)
Subjective:    Patient ID: Gabriela Carrillo, female    DOB: 07-12-1956, 61 y.o.   MRN: [id number]  HPI She is here for a physical exam.   Right knee gets puffy and hurts a little.  It occurs when she overdoes it.  It feels mild and she does not feel she needs to have it evaluated further at this time.    Decreased hearing in left ear:  She has decreased hearing in her left ear.  She does a home remedy to remove wax.  She has been unofficially tested in the past and was told she had some hearing loss.    Medications and allergies reviewed with patient and updated if appropriate.  Patient Active Problem List   Diagnosis Date Noted  . Essential hypertension, benign 05/03/2016  . 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 Allergic rhinitis 11/11/2015  . Osteopenia 09/09/2013  . Anxiety state 09/04/2009  . Irritable bowel syndrome 09/04/2009  . BREAST CANCER, HX OF 08/07/2008    Current Outpatient Prescriptions on File Prior to Visit  Medication Sig Dispense Refill  . Multiple Vitamins-Minerals (MULTIVITAMIN) tablet Take 1 tablet by mouth daily.     No current facility-administered medications on file prior to visit.     Past Medical History:  Diagnosis Date  . Allergy to sulfa drugs    itching w/o rash  . Cancer (HCC)    BREAST,PMH OF  . Depression    PMH of  . 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 Hypertension    Has been taking Losartan for about a year.  . Hypokalemia    PMH OF    Past Surgical History:  Procedure Laterality Date  . BREAST LUMPECTOMY  2004   CHEMOTHERAPY  FOLLOWED BY RADIATION  . BREAST RECONSTRUCTION Left 12/18/2014   Procedure: DELAYED LEFT BREAST RECONSTRUCTION WITH PLACEMENT OF GEL  IMPLANT;  Surgeon: Crissie Reese, MD;  Location: [facility];  Service: Plastics;  Laterality: Left;  . BUNIONECTOMY BILATERALLY    . CESAREAN SECTION    . COLONOSCOPY  2011   negative  . G3 P2    . PLACEMENT OF BREAST IMPLANTS     left    Social History   Social  History  . Marital status: Married    Spouse name: N/A  . Number of children: N/A  . Years of education: N/A   Social History Main Topics  . Smoking status: Never Smoker  . Smokeless tobacco: Never Used  . Alcohol use Yes     Comment:  2-3 per week  . Drug use: No  . Sexual activity: Not Asked   Other Topics Concern  . None   Social History Narrative   WALKS DAILY 40-45 MINS    Family History  Problem Relation Age of Onset  . Diabetes Mother   . Anxiety disorder Mother   . Goiter Mother   . Hypertension Sister        X49  . Arthritis Sister        Rheumatoid   . Cancer Neg Hx   . Heart disease Neg Hx   . Stroke Neg Hx   . Colon cancer Neg Hx     Review of Systems  Constitutional: Negative for chills and fever.  Eyes: Negative for visual disturbance.  Respiratory: Negative for cough, shortness of breath and wheezing.   Cardiovascular: Negative for chest pain, palpitations and leg swelling.  Gastrointestinal: Negative for abdominal pain, blood in  stool, constipation, diarrhea and nausea.  Genitourinary: Negative for dysuria and hematuria.  Musculoskeletal: Positive for arthralgias (knee right, fingers).  Skin: Negative for color change and rash.  Neurological: Negative for dizziness, light-headedness and headaches.  Psychiatric/Behavioral: Negative for dysphoric mood. The patient is nervous/anxious (controlled).        Objective:   Vitals:   05/09/17 1501  BP: (!) 142/90  Pulse: 74  Resp: 16  Temp: 98.2 F (36.8 C)  SpO2: 98%   Filed Weights   05/09/17 1501  Weight: 149 lb (67.6 kg)   Body mass index is 25.58 kg/m.  Wt Readings from Last 3 Encounters:  05/09/17 149 lb (67.6 kg)  03/28/17 149 lb (67.6 kg)  03/14/17 149 lb 6.4 oz (67.8 kg)     Physical Exam Constitutional: She appears well-developed and well-nourished. No distress.  HENT:  Head: Normocephalic and atraumatic.  Right Ear: External ear normal. Normal ear canal and TM Left Ear:  External ear normal.  Normal ear canal and TM Mouth/Throat: Oropharynx is clear and moist.  Eyes: Conjunctivae and EOM are normal.  Neck: Neck supple. No tracheal deviation present. No thyromegaly present.  No carotid bruit  Cardiovascular: Normal rate, regular rhythm and normal heart sounds.   No murmur heard.  No edema. Pulmonary/Chest: Effort normal and breath sounds normal. No respiratory distress. She has no wheezes. She has no rales.  Breast: deferred to Gyn Abdominal: Soft. She exhibits no distension. There is no tenderness.  Lymphadenopathy: She has no cervical adenopathy.  Skin: Skin is warm and dry. She is not diaphoretic.  Psychiatric: She has a normal mood and affect. Her behavior is normal.        Assessment & Plan:   Physical exam: Screening blood work   ordered Immunizations   Flu vaccine today, shingrix discussed Colonoscopy   Up to date  Mammogram   Up to date  Gyn  Up to date  Dexa  Last done 2009 Eye exams  Up to date  EKG   Last EKG   12/2014 Exercise  Walking some Weight  Normal BMI Skin no concerns Substance abuse   none  See Problem List for Assessment and Plan of chronic medical problems.  Fu in one year

## 2017-05-09 NOTE — Patient Instructions (Addendum)
Test(s) ordered today. Your results will be released to [place] (or called to you) after review, usually within 72hours after test completion. If any changes need to be made, you will be notified at that same time.  All other Health Maintenance issues reviewed.   All recommended immunizations and age-appropriate screenings are up-to-date or discussed.  Flu immunization administered today.    Medications reviewed and updated.   No changes recommended at this time.   A referral was ordered for dermatology  Please followup in one year   Health Maintenance, Female Adopting a healthy lifestyle and getting preventive care can go a long way to promote health and wellness. Talk with your health care provider about what schedule of regular examinations is right for you. This is a good chance for you to check in with your provider about disease prevention and staying healthy. In between checkups, there are plenty of things you can do on your own. Experts have done a lot of research about which lifestyle changes and preventive measures are most likely to keep you healthy. Ask your health care provider for more information. Weight and diet Eat a healthy diet  Be sure to include plenty of vegetables, fruits, low-fat dairy products, and lean protein.  Do not eat a lot of foods high in solid fats, added sugars, or salt.  Get regular exercise. This is one of the most important things you can do for your health. ? Most adults should exercise for at least 150 minutes each week. The exercise should increase your heart rate and make you sweat (moderate-intensity exercise). ? Most adults should also do strengthening exercises at least twice a week. This is in addition to the moderate-intensity exercise.  Maintain a healthy weight  Body mass index (BMI) is a measurement that can be used to identify possible weight problems. It estimates body fat based on height and weight. Your health care provider can  help determine your BMI and help you achieve or maintain a healthy weight.  For females 76 years of age and older: ? A BMI below 18.5 is considered underweight. ? A BMI of 18.5 to 24.9 is normal. ? A BMI of 25 to 29.9 is considered overweight. ? A BMI of 30 and above is considered obese.  Watch levels of cholesterol and blood lipids  You should start having your blood tested for lipids and cholesterol at 62 years of age, then have this test every 5 years.  You may need to have your cholesterol levels checked more often if: ? Your lipid or cholesterol levels are high. ? You are older than 62 years of age. ? You are at high risk for heart disease.  Cancer screening Lung Cancer  Lung cancer screening is recommended for adults 50-61 years old who are at high risk for lung cancer because of a history of smoking.  A yearly low-dose CT scan of the lungs is recommended for people who: ? Currently smoke. ? Have quit within the past 15 years. ? Have at least a 30-pack-year history of smoking. A pack year is smoking an average of one pack of cigarettes a day for 1 year.  Yearly screening should continue until it has been 15 years since you quit.  Yearly screening should stop if you develop a health problem that would prevent you from having lung cancer treatment.  Breast Cancer  Practice breast self-awareness. This means understanding how your breasts normally appear and feel.  It also means doing regular breast self-exams.  Let your health care provider know about any changes, no matter how small.  If you are in your 20s or 30s, you should have a clinical breast exam (CBE) by a health care provider every 1-3 years as part of a regular health exam.  If you are 6 or older, have a CBE every year. Also consider having a breast X-ray (mammogram) every year.  If you have a family history of breast cancer, talk to your health care provider about genetic screening.  If you are at high risk  for breast cancer, talk to your health care provider about having an MRI and a mammogram every year.  Breast cancer gene (BRCA) assessment is recommended for women who have family members with BRCA-related cancers. BRCA-related cancers include: ? Breast. ? Ovarian. ? Tubal. ? Peritoneal cancers.  Results of the assessment will determine the need for genetic counseling and BRCA1 and BRCA2 testing.  Cervical Cancer Your health care provider may recommend that you be screened regularly for cancer of the pelvic organs (ovaries, uterus, and vagina). This screening involves a pelvic examination, including checking for microscopic changes to the surface of your cervix (Pap test). You may be encouraged to have this screening done every 3 years, beginning at age 1.  For women ages 30-65, health care providers may recommend pelvic exams and Pap testing every 3 years, or they may recommend the Pap and pelvic exam, combined with testing for human papilloma virus (HPV), every 5 years. Some types of HPV increase your risk of cervical cancer. Testing for HPV may also be done on women of any age with unclear Pap test results.  Other health care providers may not recommend any screening for nonpregnant women who are considered low risk for pelvic cancer and who do not have symptoms. Ask your health care provider if a screening pelvic exam is right for you.  If you have had past treatment for cervical cancer or a condition that could lead to cancer, you need Pap tests and screening for cancer for at least 20 years after your treatment. If Pap tests have been discontinued, your risk factors (such as having a new sexual partner) need to be reassessed to determine if screening should resume. Some women have medical problems that increase the chance of getting cervical cancer. In these cases, your health care provider may recommend more frequent screening and Pap tests.  Colorectal Cancer  This type of cancer can be  detected and often prevented.  Routine colorectal cancer screening usually begins at 62 years of age and continues through 62 years of age.  Your health care provider may recommend screening at an earlier age if you have risk factors for colon cancer.  Your health care provider may also recommend using home test kits to check for hidden blood in the stool.  A small camera at the end of a tube can be used to examine your colon directly (sigmoidoscopy or colonoscopy). This is done to check for the earliest forms of colorectal cancer.  Routine screening usually begins at age 105.  Direct examination of the colon should be repeated every 5-10 years through 62 years of age. However, you may need to be screened more often if early forms of precancerous polyps or small growths are found.  Skin Cancer  Check your skin from head to toe regularly.  Tell your health care provider about any new moles or changes in moles, especially if there is a change in a mole's shape or color.  Also tell your health care provider if you have a mole that is larger than the size of a pencil eraser.  Always use sunscreen. Apply sunscreen liberally and repeatedly throughout the day.  Protect yourself by wearing long sleeves, pants, a wide-brimmed hat, and sunglasses whenever you are outside.  Heart disease, diabetes, and high blood pressure  High blood pressure causes heart disease and increases the risk of stroke. High blood pressure is more likely to develop in: ? People who have blood pressure in the high end of the normal range (130-139/85-89 mm Hg). ? People who are overweight or obese. ? People who are African American.  If you are 40-75 years of age, have your blood pressure checked every 3-5 years. If you are 53 years of age or older, have your blood pressure checked every year. You should have your blood pressure measured twice-once when you are at a hospital or clinic, and once when you are not at a  hospital or clinic. Record the average of the two measurements. To check your blood pressure when you are not at a hospital or clinic, you can use: ? An automated blood pressure machine at a pharmacy. ? A home blood pressure monitor.  If you are between 10 years and 73 years old, ask your health care provider if you should take aspirin to prevent strokes.  Have regular diabetes screenings. This involves taking a blood sample to check your fasting blood sugar level. ? If you are at a normal weight and have a low risk for diabetes, have this test once every three years after 62 years of age. ? If you are overweight and have a high risk for diabetes, consider being tested at a younger age or more often. Preventing infection Hepatitis B  If you have a higher risk for hepatitis B, you should be screened for this virus. You are considered at high risk for hepatitis B if: ? You were born in a country where hepatitis B is common. Ask your health care provider which countries are considered high risk. ? Your parents were born in a high-risk country, and you have not been immunized against hepatitis B (hepatitis B vaccine). ? You have HIV or AIDS. ? You use needles to inject street drugs. ? You live with someone who has hepatitis B. ? You have had sex with someone who has hepatitis B. ? You get hemodialysis treatment. ? You take certain medicines for conditions, including cancer, organ transplantation, and autoimmune conditions.  Hepatitis C  Blood testing is recommended for: ? Everyone born from [birth year] through 1966. ? Anyone with known risk factors for hepatitis C.  Sexually transmitted infections (STIs)  You should be screened for sexually transmitted infections (STIs) including gonorrhea and chlamydia if: ? You are sexually active and are younger than 62 years of age. ? You are older than 62 years of age and your health care provider tells you that you are at risk for this type of  infection. ? Your sexual activity has changed since you were last screened and you are at an increased risk for chlamydia or gonorrhea. Ask your health care provider if you are at risk.  If you do not have HIV, but are at risk, it may be recommended that you take a prescription medicine daily to prevent HIV infection. This is called pre-exposure prophylaxis (PrEP). You are considered at risk if: ? You are sexually active and do not regularly use condoms or know the HIV status of  your partner(s). ? You take drugs by injection. ? You are sexually active with a partner who has HIV.  Talk with your health care provider about whether you are at high risk of being infected with HIV. If you choose to begin PrEP, you should first be tested for HIV. You should then be tested every 3 months for as long as you are taking PrEP. Pregnancy  If you are premenopausal and you may become pregnant, ask your health care provider about preconception counseling.  If you may become pregnant, take 400 to 800 micrograms (mcg) of folic acid every day.  If you want to prevent pregnancy, talk to your health care provider about birth control (contraception). Osteoporosis and menopause  Osteoporosis is a disease in which the bones lose minerals and strength with aging. This can result in serious bone fractures. Your risk for osteoporosis can be identified using a bone density scan.  If you are 59 years of age or older, or if you are at risk for osteoporosis and fractures, ask your health care provider if you should be screened.  Ask your health care provider whether you should take a calcium or vitamin D supplement to lower your risk for osteoporosis.  Menopause may have certain physical symptoms and risks.  Hormone replacement therapy may reduce some of these symptoms and risks. Talk to your health care provider about whether hormone replacement therapy is right for you. Follow these instructions at home:  Schedule  regular health, dental, and eye exams.  Stay current with your immunizations.  Do not use any tobacco products including cigarettes, chewing tobacco, or electronic cigarettes.  If you are pregnant, do not drink alcohol.  If you are breastfeeding, limit how much and how often you drink alcohol.  Limit alcohol intake to no more than 1 drink per day for nonpregnant women. One drink equals 12 ounces of beer, 5 ounces of wine, or 1 ounces of hard liquor.  Do not use street drugs.  Do not share needles.  Ask your health care provider for help if you need support or information about quitting drugs.  Tell your health care provider if you often feel depressed.  Tell your health care provider if you have ever been abused or do not feel safe at home. This information is not intended to replace advice given to you by your health care provider. Make sure you discuss any questions you have with your health care provider. Document Released: 02/07/2011 Document Revised: 12/31/2015 Document Reviewed: 04/28/2015 Elsevier Interactive Patient Education  [employer].

## 2017-05-10 ENCOUNTER — Encounter: Payer: Self-pay | Admitting: Internal Medicine

## 2017-05-19 ENCOUNTER — Encounter: Payer: PRIVATE HEALTH INSURANCE | Attending: Family Medicine

## 2017-05-20 ENCOUNTER — Ambulatory Visit (INDEPENDENT_AMBULATORY_CARE_PROVIDER_SITE_OTHER): Payer: 59 | Admitting: Family Medicine

## 2017-05-20 ENCOUNTER — Encounter: Payer: Self-pay | Admitting: Family Medicine

## 2017-05-20 VITALS — BP 148/88 | HR 97 | Temp 99.3°F | Resp 14 | Ht 64.0 in | Wt 151.0 lb

## 2017-05-20 DIAGNOSIS — J329 Chronic sinusitis, unspecified: Secondary | ICD-10-CM | POA: Insufficient documentation

## 2017-05-20 DIAGNOSIS — J019 Acute sinusitis, unspecified: Secondary | ICD-10-CM | POA: Diagnosis not present

## 2017-05-20 DIAGNOSIS — R51 Headache: Secondary | ICD-10-CM

## 2017-05-20 DIAGNOSIS — R519 Headache, unspecified: Secondary | ICD-10-CM | POA: Insufficient documentation

## 2017-05-20 DIAGNOSIS — I10 Essential (primary) hypertension: Secondary | ICD-10-CM | POA: Diagnosis not present

## 2017-05-20 DIAGNOSIS — R0683 Snoring: Secondary | ICD-10-CM

## 2017-05-20 HISTORY — DX: Headache, unspecified: R51.9

## 2017-05-20 MED ORDER — BUTALBITAL-ACETAMINOPHEN 25-325 MG PO TABS: 1.0000 | ORAL_TABLET | Freq: Three times a day (TID) | ORAL | 0 refills | Status: DC | PRN

## 2017-05-20 MED ORDER — FLUTICASONE PROPIONATE 50 MCG/ACT NA SUSP: 2.0000 | Freq: Every day | NASAL | 1 refills | Status: DC | PRN

## 2017-05-20 MED ORDER — CETIRIZINE HCL 10 MG PO TABS: 10.0000 mg | ORAL_TABLET | Freq: Two times a day (BID) | ORAL | 1 refills | Status: DC | PRN

## 2017-05-20 MED ORDER — PROMETHAZINE HCL 25 MG PO TABS: 25.0000 mg | ORAL_TABLET | Freq: Three times a day (TID) | ORAL | 0 refills | Status: DC | PRN

## 2017-05-20 MED ORDER — CEFDINIR 300 MG PO CAPS: 300.0000 mg | ORAL_CAPSULE | Freq: Two times a day (BID) | ORAL | 0 refills | Status: AC

## 2017-05-20 NOTE — Assessment & Plan Note (Signed)
Encouraged increased hydration, 64 ounces of clear fluids daily. Minimize alcohol and caffeine. Eat small frequent meals with lean proteins and complex carbs. Avoid high and low blood sugars. Get adequate sleep, 7-8 hours a night. Needs exercise daily preferably in the morning. This is the first time she has had migrainous symptoms with N/V but denies photophobia, phonophobia or other neurologic concerns. They do endorse restless sleep, snoring, awakening suddenly with choking sob sensation over a long period of time so after long discussion they are interested in a pulmonology consultation to rule out sleep apnea, referral is placed and she will follow up with PMD with any questions or concerns.

## 2017-05-20 NOTE — Patient Instructions (Signed)
For sinusitis: Encouraged increased rest and hydration, add probiotics, zinc such as Coldeze or Xicam. Treat fevers as needed, consider vitamin c 500 to 1000 mg daily Plain Mucinex twice daily x 10 days   Migraine Headache A migraine headache is a very strong throbbing pain on one side or both sides of your head. Migraines can also cause other symptoms. Talk with your doctor about what things may bring on (trigger) your migraine headaches. Follow these instructions at home: Medicines  Take over-the-counter and prescription medicines only as told by your doctor.  Do not drive or use heavy machinery while taking prescription pain medicine.  To prevent or treat constipation while you are taking prescription pain medicine, your doctor may recommend that you: ? Drink enough fluid to keep your pee (urine) clear or pale yellow. ? Take over-the-counter or prescription medicines. ? Eat foods that are high in fiber. These include fresh fruits and vegetables, whole grains, and beans. ? Limit foods that are high in fat and processed sugars. These include fried and sweet foods. Lifestyle  Avoid alcohol.  Do not use any products that contain nicotine or tobacco, such as cigarettes and e-cigarettes. If you need help quitting, ask your doctor.  Get at least 8 hours of sleep every night.  Limit your stress. General instructions   Keep a journal to find out what may bring on your migraines. For example, write down: ? What you eat and drink. ? How much sleep you get. ? Any change in what you eat or drink. ? Any change in your medicines.  If you have a migraine: ? Avoid things that make your symptoms worse, such as bright lights. ? It may help to lie down in a dark, quiet room. ? Do not drive or use heavy machinery. ? Ask your doctor what activities are safe for you.  Keep all follow-up visits as told by your doctor. This is important. Contact a doctor if:  You get a migraine that is  different or worse than your usual migraines. Get help right away if:  Your migraine gets very bad.  You have a fever.  You have a stiff neck.  You have trouble seeing.  Your muscles feel weak or like you cannot control them.  You start to lose your balance a lot.  You start to have trouble walking.  You pass out (faint). This information is not intended to replace advice given to you by your health care provider. Make sure you discuss any questions you have with your health care provider. Document Released: 05/03/2008 Document Revised: 02/12/2016 Document Reviewed: 01/11/2016 Elsevier Interactive Patient Education  2017 [employer] Sleep Apnea Sleep apnea is a condition that affects breathing. People with sleep apnea have moments during sleep when their breathing pauses briefly or gets shallow. Sleep apnea can cause these symptoms:  Trouble staying asleep.  Sleepiness or tiredness during the day.  Irritability.  Loud snoring.  Morning headaches.  Trouble concentrating.  Forgetting things.  Less interest in sex.  Being sleepy for no reason.  Mood swings.  Personality changes.  Depression.  Waking up a lot during the night to pee (urinate).  Dry mouth.  Sore throat.  Follow these instructions at home:  Make any changes in your routine that your doctor recommends.  Eat a healthy, well-balanced diet.  Take over-the-counter and prescription medicines only as told by your doctor.  Avoid using alcohol, calming medicines (sedatives), and narcotic medicines.  Take steps to lose weight if you  are overweight.  If you were given a machine (device) to use while you sleep, use it only as told by your doctor.  Do not use any tobacco products, such as cigarettes, chewing tobacco, and e-cigarettes. If you need help quitting, ask your doctor.  Keep all follow-up visits as told by your doctor. This is important. Contact a doctor if:  The machine that you were  given to use during sleep is uncomfortable or does not seem to be working.  Your symptoms do not get better.  Your symptoms get worse. Get help right away if:  Your chest hurts.  You have trouble breathing in enough air (shortness of breath).  You have an uncomfortable feeling in your back, arms, or stomach.  You have trouble talking.  One side of your body feels weak.  A part of your face is hanging down (drooping). These symptoms may be an emergency. Do not wait to see if the symptoms will go away. Get medical help right away. Call your local emergency services (911 in the [place] Do not drive yourself to the hospital. This information is not intended to replace advice given to you by your health care provider. Make sure you discuss any questions you have with your health care provider. Document Released: 05/03/2008 Document Revised: 03/20/2016 Document Reviewed: 05/04/2015 Elsevier Interactive Patient Education  2018 [employer]. Sinusitis, Adult Sinusitis is soreness and inflammation of your sinuses. Sinuses are hollow spaces in the bones around your face. They are located:  Around your eyes.  In the middle of your forehead.  Behind your nose.  In your cheekbones.  Your sinuses and nasal passages are lined with a stringy fluid (mucus). Mucus normally drains out of your sinuses. When your nasal tissues get inflamed or swollen, the mucus can get trapped or blocked so air cannot flow through your sinuses. This lets bacteria, viruses, and funguses grow, and that leads to infection. Follow these instructions at home: Medicines  Take, use, or apply over-the-counter and prescription medicines only as told by your doctor. These may include nasal sprays.  If you were prescribed an antibiotic medicine, take it as told by your doctor. Do not stop taking the antibiotic even if you start to feel better. Hydrate and Humidify  Drink enough water to keep your pee (urine) clear or pale  yellow.  Use a cool mist humidifier to keep the humidity level in your home above 50%.  Breathe in steam for 10-15 minutes, 3-4 times a day or as told by your doctor. You can do this in the bathroom while a hot shower is running.  Try not to spend time in cool or dry air. Rest  Rest as much as possible.  Sleep with your head raised (elevated).  Make sure to get enough sleep each night. General instructions  Put a warm, moist washcloth on your face 3-4 times a day or as told by your doctor. This will help with discomfort.  Wash your hands often with soap and water. If there is no soap and water, use hand sanitizer.  Do not smoke. Avoid being around people who are smoking (secondhand smoke).  Keep all follow-up visits as told by your doctor. This is important. Contact a doctor if:  You have a fever.  Your symptoms get worse.  Your symptoms do not get better within 10 days. Get help right away if:  You have a very bad headache.  You cannot stop throwing up (vomiting).  You have pain or  swelling around your face or eyes.  You have trouble seeing.  You feel confused.  Your neck is stiff.  You have trouble breathing. This information is not intended to replace advice given to you by your health care provider. Make sure you discuss any questions you have with your health care provider. Document Released: 01/11/2008 Document Revised: 03/20/2016 Document Reviewed: 05/20/2015 Elsevier Interactive Patient Education  [employer].

## 2017-05-20 NOTE — Progress Notes (Signed)
Pre visit review using our clinic review tool, if applicable. No additional management support is needed unless otherwise documented below in the visit note. 

## 2017-05-20 NOTE — Assessment & Plan Note (Addendum)
Cefdinir and Mucinex. Encouraged increased rest and hydration, add probiotics, zinc such as Coldeze or Xicam. Treat fevers as needed. They understand that if she worsens they agree to go to the ER for further consideration

## 2017-05-20 NOTE — Assessment & Plan Note (Signed)
Elevated with acute illness, avoid caffeine and return is symptoms worsen
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
## 2017-05-22 ENCOUNTER — Telehealth: Payer: Self-pay | Admitting: Emergency Medicine

## 2017-05-22 NOTE — Telephone Encounter (Signed)
Spoke with pt after received after hours fax about medication not being available at the pharmacy. Pt states she was about to get a different dose.

## 2017-06-05 ENCOUNTER — Ambulatory Visit (INDEPENDENT_AMBULATORY_CARE_PROVIDER_SITE_OTHER): Payer: 59 | Admitting: Internal Medicine

## 2017-06-05 ENCOUNTER — Encounter: Payer: Self-pay | Admitting: Internal Medicine

## 2017-06-05 ENCOUNTER — Other Ambulatory Visit (INDEPENDENT_AMBULATORY_CARE_PROVIDER_SITE_OTHER): Payer: 59

## 2017-06-05 ENCOUNTER — Telehealth: Payer: Self-pay | Admitting: Internal Medicine

## 2017-06-05 VITALS — BP 132/88 | HR 103 | Temp 99.9°F | Resp 18 | Wt 144.0 lb

## 2017-06-05 DIAGNOSIS — R197 Diarrhea, unspecified: Secondary | ICD-10-CM | POA: Diagnosis not present

## 2017-06-05 LAB — COMPREHENSIVE METABOLIC PANEL
ALT: 20 U/L (ref 0–35)
AST: 17 U/L (ref 0–37)
Albumin: 4 g/dL (ref 3.5–5.2)
Alkaline Phosphatase: 64 U/L (ref 39–117)
BUN: 7 mg/dL (ref 6–23)
CHLORIDE: 103 meq/L (ref 96–112)
CO2: 27 meq/L (ref 19–32)
CREATININE: 0.57 mg/dL (ref 0.40–1.20)
Calcium: 8.9 mg/dL (ref 8.4–10.5)
GFR: 114.11 mL/min (ref >60.00)
Glucose, Bld: 103 mg/dL — ABNORMAL HIGH (ref 70–99)
POTASSIUM: 3.2 meq/L — AB (ref 3.5–5.1)
SODIUM: 139 meq/L (ref 135–145)
Total Bilirubin: 0.5 mg/dL (ref 0.2–1.2)
Total Protein: 6.8 g/dL (ref 6.0–8.3)

## 2017-06-05 LAB — CBC WITH DIFFERENTIAL/PLATELET
BASOS ABS: 0 10*3/uL (ref 0.0–0.1)
BASOS PCT: 0.3 % (ref 0.0–3.0)
EOS ABS: 0 10*3/uL (ref 0.0–0.7)
Eosinophils Relative: 0.6 % (ref 0.0–5.0)
HEMATOCRIT: 42.5 % (ref 36.0–46.0)
HEMOGLOBIN: 14.7 g/dL (ref 12.0–15.0)
LYMPHS PCT: 22.5 % (ref 12.0–46.0)
Lymphs Abs: 1.5 10*3/uL (ref 0.7–4.0)
MCHC: 34.6 g/dL (ref 30.0–36.0)
MCV: 90.6 fl (ref 78.0–100.0)
MONO ABS: 0.5 10*3/uL (ref 0.1–1.0)
Monocytes Relative: 7 % (ref 3.0–12.0)
Neutro Abs: 4.5 10*3/uL (ref 1.4–7.7)
Neutrophils Relative %: 69.6 % (ref 43.0–77.0)
PLATELETS: 210 10*3/uL (ref 150.0–400.0)
RBC: 4.7 Mil/uL (ref 3.87–5.11)
RDW: 12.1 % (ref 11.5–15.5)
WBC: 6.5 10*3/uL (ref 4.0–10.5)

## 2017-06-05 NOTE — Assessment & Plan Note (Signed)
Concern for possible cdiff given timing of antiibotics  - will check cbc, cmp, stool studies Viral or other bacterial infection is possible Brat diet Increase fluids Will call with results, call if no improvement/worsening

## 2017-06-05 NOTE — Progress Notes (Signed)
Subjective:    Patient ID: Gabriela Carrillo, female    DOB: 05-24-1956, 61 y.o.   MRN: [id number]  HPI She is here for an acute visit.   Flu shot 10/2.   The next week she had a severe headaches associated with nausea and vomiting - she had this twice. She was seen here on 10/13 and was diagnosed with a possible sinus infection and was prescribed cefdinir. She took this for 10 days.  She finished it 10/26.  Last week, prior to finishing the antibiotic, she started to not feel well.  She has had fever, diarrhea, chills.  Last night the fever was 101, 100.4 today.  Her legs ache - the top of her feet ache.  She is not eating much because it goes straight through her.    She is having diarrhea in the middle of the night.  She has not had diarrhea since earlier today.  She has 4-5 times a day.  She has had lower abdominal pain.  She does not have that now.      Medications and allergies reviewed with patient and updated if appropriate.  Patient Active Problem List   Diagnosis Date Noted  . Sinusitis 05/20/2017  . Headache 05/20/2017  . Essential hypertension, benign 05/03/2016  . 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 Allergic rhinitis 11/11/2015  . Osteopenia 09/09/2013  . Anxiety state 09/04/2009  . Irritable bowel syndrome 09/04/2009  . BREAST CANCER, HX OF 08/07/2008    Current Outpatient Prescriptions on File Prior to Visit  Medication Sig Dispense Refill  . Butalbital-Acetaminophen 25-325 MG TABS Take 1 tablet by mouth 3 (three) times daily as needed. 15 tablet 0  . cetirizine (ZYRTEC) 10 MG tablet Take 1 tablet (10 mg total) by mouth 2 (two) times daily as needed for allergies. 30 tablet 1  . FLUoxetine (PROZAC) 20 MG capsule Take 1 capsule (20 mg total) by mouth daily. 30 capsule 5  . fluticasone (FLONASE) 50 MCG/ACT nasal spray Place 2 sprays into both nostrils daily as needed for allergies or rhinitis. 16 g 1  . losartan (COZAAR) 50 MG tablet Take 1 tablet (50 mg total) by mouth daily.  30 tablet 5  . Multiple Vitamins-Minerals (MULTIVITAMIN) tablet Take 1 tablet by mouth daily.    . promethazine (PHENERGAN) 25 MG tablet Take 1 tablet (25 mg total) by mouth every 8 (eight) hours as needed for nausea or vomiting. 15 tablet 0   No current facility-administered medications on file prior to visit.     Past Medical History:  Diagnosis Date  . Allergy to sulfa drugs    itching w/o rash  . Cancer (HCC)    BREAST,PMH OF  . Depression    PMH of  . 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 Headache 05/20/2017   Encouraged increased hydration, 64 ounces of clear fluids daily. Minimize alcohol and caffeine. Eat small frequent meals with lean proteins and complex carbs. Avoid high and low blood sugars. Get adequate sleep, 7-8 hours a night. Needs exercise daily preferably in the morning. This is the first time she has had migrainous symptoms with N/V but denies photophobia, phonophobia or other neurologic c  . Hypertension    Has been taking Losartan for about a year.  . Hypokalemia    PMH OF    Past Surgical History:  Procedure Laterality Date  . BREAST LUMPECTOMY  2004   CHEMOTHERAPY  FOLLOWED BY RADIATION  . BREAST RECONSTRUCTION Left  12/18/2014   Procedure: DELAYED LEFT BREAST RECONSTRUCTION WITH PLACEMENT OF GEL  IMPLANT;  Surgeon: Crissie Reese, MD;  Location: [facility];  Service: Plastics;  Laterality: Left;  . BUNIONECTOMY BILATERALLY    . CESAREAN SECTION    . COLONOSCOPY  2011   negative  . G3 P2    . PLACEMENT OF BREAST IMPLANTS     left    Social History   Social History  . Marital status: Married    Spouse name: N/A  . Number of children: N/A  . Years of education: N/A   Social History Main Topics  . Smoking status: Never Smoker  . Smokeless tobacco: Never Used  . Alcohol use Yes     Comment:  2-3 per week  . Drug use: No  . Sexual activity: Not Asked   Other Topics Concern  . None   Social History Narrative   WALKS DAILY 40-45 MINS     Family History  Problem Relation Age of Onset  . Diabetes Mother   . Anxiety disorder Mother   . Goiter Mother   . Hypertension Sister        X50  . Arthritis Sister        Rheumatoid   . Cancer Neg Hx   . Heart disease Neg Hx   . Stroke Neg Hx   . Colon cancer Neg Hx     Review of Systems  Constitutional: Positive for appetite change (decreased), chills, fatigue and fever.  HENT: Positive for postnasal drip.   Respiratory: Positive for cough (mild started yesterday). Negative for shortness of breath and wheezing.   Gastrointestinal: Positive for abdominal pain (lower abdomen), diarrhea and nausea. Negative for blood in stool and vomiting (only with migraine).  Genitourinary: Negative for dysuria and hematuria.  Musculoskeletal: Positive for myalgias.  Neurological: Positive for headaches (two migraines, little aches since then). Negative for light-headedness.       Objective:   Vitals:   06/05/17 1445  BP: 132/88  Pulse: (!) 103  Resp: 18  Temp: 99.9 F (37.7 C)  SpO2: 98%   Filed Weights   06/05/17 1445  Weight: 144 lb (65.3 kg)   Body mass index is 24.72 kg/m.  Wt Readings from Last 3 Encounters:  06/05/17 144 lb (65.3 kg)  05/20/17 151 lb (68.5 kg)  05/09/17 149 lb (67.6 kg)     Physical Exam Constitutional: Appears well-developed and well-nourished. No distress.  HENT:  Head: Normocephalic and atraumatic.  Neck: Neck supple. No tracheal deviation present. No thyromegaly present.  No cervical lymphadenopathy Cardiovascular: Normal rate, regular rhythm and normal heart sounds.   No murmur heard. No carotid bruit .  No edema Pulmonary/Chest: Effort normal and breath sounds normal. No respiratory distress. No has no wheezes. No rales.  Abdomen: soft, non distended, mild diffuse tenderness Skin: Skin is warm and dry. Not diaphoretic.  Psychiatric: Normal mood and affect. Behavior is normal.         Assessment & Plan:   See Problem List for  Assessment and Plan of chronic medical problems.

## 2017-06-05 NOTE — Patient Instructions (Signed)
  Test(s) ordered today. Your results will be released to [place] (or called to you) after review, usually within 72hours after test completion. If any changes need to be made, you will be notified at that same time.   Medications reviewed and updated.  No changes recommended at this time.   Call if no improvement     Bland Diet A bland diet consists of foods that do not have a lot of fat or fiber. Foods without fat or fiber are easier for the body to digest. They are also less likely to irritate your mouth, throat, stomach, and other parts of your gastrointestinal tract. A bland diet is sometimes called a BRAT diet. What is my plan? Your health care provider or dietitian may recommend specific changes to your diet to prevent and treat your symptoms, such as:  Eating small meals often.  Cooking food until it is soft enough to chew easily.  Chewing your food well.  Drinking fluids slowly.  Not eating foods that are very spicy, sour, or fatty.  Not eating citrus fruits, such as oranges and grapefruit.  What do I need to know about this diet?  Eat a variety of foods from the bland diet food list.  Do not follow a bland diet longer than you have to.  Ask your health care provider whether you should take vitamins. What foods can I eat? Grains  Hot cereals, such as cream of wheat. Bread, crackers, or tortillas made from refined white flour. Rice. Vegetables Canned or cooked vegetables. Mashed or boiled potatoes. Fruits Bananas. Applesauce. Other types of cooked or canned fruit with the skin and seeds removed, such as canned peaches or pears. Meats and Other Protein Sources Scrambled eggs. Creamy peanut butter or other nut butters. Lean, well-cooked meats, such as chicken or fish. Tofu. Soups or broths. Dairy Low-fat dairy products, such as milk, cottage cheese, or yogurt. Beverages Water. Herbal tea. Apple juice. Sweets and Desserts Pudding. Custard. Fruit gelatin. Ice  cream. Fats and Oils Mild salad dressings. Canola or olive oil. The items listed above may not be a complete list of allowed foods or beverages. Contact your dietitian for more options. What foods are not recommended? Foods and ingredients that are often not recommended include:  Spicy foods, such as hot sauce or salsa.  Fried foods.  Sour foods, such as pickled or fermented foods.  Raw vegetables or fruits, especially citrus or berries.  Caffeinated drinks.  Alcohol.  Strongly flavored seasonings or condiments.  The items listed above may not be a complete list of foods and beverages that are not allowed. Contact your dietitian for more information. This information is not intended to replace advice given to you by your health care provider. Make sure you discuss any questions you have with your health care provider. Document Released: 11/16/2015 Document Revised: 12/31/2015 Document Reviewed: 08/06/2014 Elsevier Interactive Patient Education  2018 [employer].

## 2017-06-06 ENCOUNTER — Other Ambulatory Visit: Payer: 59

## 2017-06-06 ENCOUNTER — Encounter: Payer: Self-pay | Admitting: Internal Medicine

## 2017-06-06 DIAGNOSIS — R197 Diarrhea, unspecified: Secondary | ICD-10-CM

## 2017-06-07 ENCOUNTER — Other Ambulatory Visit: Payer: 59

## 2017-06-07 ENCOUNTER — Other Ambulatory Visit: Payer: Self-pay | Admitting: Internal Medicine

## 2017-06-07 DIAGNOSIS — A0472 Enterocolitis due to Clostridium difficile, not specified as recurrent: Secondary | ICD-10-CM | POA: Insufficient documentation

## 2017-06-07 DIAGNOSIS — R197 Diarrhea, unspecified: Secondary | ICD-10-CM

## 2017-06-07 LAB — FECAL LACTOFERRIN, QUANT
Fecal Lactoferrin: POSITIVE — AB
MICRO NUMBER: 81215484
SPECIMEN QUALITY:: ADEQUATE

## 2017-06-07 LAB — C. DIFFICILE GDH AND TOXIN A/B
GDH ANTIGEN: DETECTED
MICRO NUMBER: 81215483
SPECIMEN QUALITY: ADEQUATE
TOXIN A AND B: DETECTED

## 2017-06-07 LAB — TIQ-NTM

## 2017-06-07 MED ORDER — VANCOMYCIN HCL 125 MG PO CAPS: 125.0000 mg | ORAL_CAPSULE | Freq: Four times a day (QID) | ORAL | 0 refills | Status: DC

## 2017-06-07 NOTE — Telephone Encounter (Signed)
Spoke with pt to inform. Per Dr Quay Burow, pt should stay out of work the rest of this week and return to work on Monday. Pt will call back if a work note is needed.

## 2017-06-07 NOTE — Telephone Encounter (Signed)
She does have cdiff diarrhea.  This is likely from taking the other antibiotic.  She needs to start a specific antibiotic that we will treat this.  (Pending-please send)  If the diarrhea does not resolve completely she needs to call.  If the diarrhea recurs in the next 2 months she needs to call.

## 2017-06-08 ENCOUNTER — Other Ambulatory Visit: Payer: Self-pay

## 2017-06-09 ENCOUNTER — Encounter: Payer: Self-pay | Admitting: Internal Medicine

## 2017-06-11 LAB — STOOL CULTURE
MICRO NUMBER: 81221641
MICRO NUMBER: 81221642
MICRO NUMBER:: 81221643
SHIGA RESULT: NOT DETECTED
SPECIMEN QUALITY: ADEQUATE
SPECIMEN QUALITY:: ADEQUATE
SPECIMEN QUALITY:: ADEQUATE

## 2017-06-15 ENCOUNTER — Ambulatory Visit: Admit: 2017-06-15 | Discharge: 2017-06-15 | Payer: PRIVATE HEALTH INSURANCE | Attending: Family Medicine

## 2017-06-15 DIAGNOSIS — M545 Low back pain, unspecified: Secondary | ICD-10-CM

## 2017-06-15 MED ORDER — ATORVASTATIN CALCIUM 20 MG PO TABS
20 | ORAL_TABLET | Freq: Every day | ORAL | 1 refills | Status: DC
Start: 2017-06-15 — End: 2018-01-02

## 2017-06-15 MED ORDER — LISINOPRIL 5 MG PO TABS
5 | ORAL_TABLET | ORAL | 1 refills | Status: DC
Start: 2017-06-15 — End: 2018-01-02

## 2017-06-15 MED ORDER — TRAMADOL HCL 50 MG PO TABS
50 | ORAL_TABLET | Freq: Four times a day (QID) | ORAL | 0 refills | Status: AC | PRN
Start: 2017-06-15 — End: 2017-07-15

## 2017-06-15 MED ORDER — DOXEPIN HCL 10 MG PO CAPS
10 MG | ORAL_CAPSULE | Freq: Every evening | ORAL | 3 refills | Status: DC
Start: 2017-06-15 — End: 2018-01-02

## 2017-06-15 MED ORDER — AMLODIPINE BESYLATE 5 MG PO TABS
5 | ORAL_TABLET | ORAL | 1 refills | Status: DC
Start: 2017-06-15 — End: 2018-01-02

## 2017-06-15 MED ORDER — HYDROXYZINE HCL 50 MG PO TABS
50 | ORAL_TABLET | ORAL | 1 refills | Status: DC
Start: 2017-06-15 — End: 2017-11-07

## 2017-06-15 NOTE — Patient Instructions (Signed)
Consider sleep apnea referral

## 2017-06-15 NOTE — Progress Notes (Signed)
Subjective:      Patient ID: Cassandra Copeland is a 61 y.o. female.    HPI  Chief Complaint   Patient presents with   . Hypertension     Denies cardiovascular, CNS or peripheral vascular complaints; is on lisinopril and states she's had a dry nonproductive cough for a while.  Nonsmoker.  Denies fever sweats chills or chest pain.  No shortness of breath.  States doesn't bother her that much and she would prefer not to go off the lisinopril even for a short period of time   . Hyperlipidemia     not fasting ; denies any problems taking the medicine   . Anxiety     Stresses and strains-please see above   . Depression     Stresses with raising her daughter's child.  Daughter is doing reasonably well with chemical dependency issue.   . Insomnia     states the Rem[place]offlerambien is not working ;see meds;Z+caffeine;?osa;snores;no daytime sx but drinks a lot of coffeed   . Cough     See above   . Back Pain     Patient calls it "butt pain.  Uses tramadol intermittently.  Mostly at bedtime so she can sleep.  The discomfort is in her cheek.     Chief complaint and present illness: [id number] 61-year-old white female presents unaccompanied for routine.  Follow-up.  Patient is most distressed about her trouble sleeping.  She states she's had this problem for a while I believe.  She is a fairly heavy caffeinated.  Her spouse says she does snore at night but nothing to suggest not's breathing.  Patient drinks a lot of coffee just keep her going.  But she stops at 4 PM.  Issue around caffeine discussed with patient.  Issue surrounding possible sleep apnea need for evaluation discussed.  She will think about that.    Review of Systems   Constitutional: Negative.    Respiratory: Negative.    Psychiatric/Behavioral: Positive for decreased concentration and sleep disturbance. Negative for suicidal ideas. The patient is not nervous/anxious.      background/entire past medical,social and family history obtained and reviewed/updated today   Objective:   Physical  Exam   Constitutional: She appears well-developed and well-nourished.   Cardiovascular: Normal rate, regular rhythm and normal heart sounds.    Pulmonary/Chest: Effort normal and breath sounds normal.   Abdominal: Soft.   Musculoskeletal: She exhibits no edema.   Neurological: She is alert.   Psychiatric: Her speech is normal and behavior is normal. Thought content normal. Her mood appears not anxious. Cognition and memory are normal. She does not exhibit a depressed mood.   Nursing note and vitals reviewed.    BP 120/80   Pulse 67   Ht 5\' 3"  (1.6 m)   Wt 198 lb (89.8 kg)   SpO2 100%   BMI 35.07 kg/m   Controlled Substances Monitoring:     RX Monitoring 06/15/2017   Attestation The Prescription Monitoring Report for this patient was reviewed today.   Documentation Possible medication side effects, risk of tolerance/dependence & alternative treatments discussed.;No signs of potential drug abuse or diversion identified.   Chronic Pain Treatment objectives documented - patient is progressing appropriately.;Functional status reviewed - continues with improved or maintaining ADL's.      Assessment:      Cassandra Copeland was seen today for hypertension, hyperlipidemia, anxiety, depression, insomnia, cough and back pain.    Diagnoses and all orders for this visit:    Chronic  bilateral low back pain without sciatica  -     traMADol (ULTRAM) 50 MG tablet; Take 1 tablet by mouth every 6 hours as needed for Pain for up to 30 days..    Primary insomnia  -     doxepin (SINEQUAN) 10 MG capsule; Take 1 capsule by mouth nightly    Fibromyalgia  -     hydrOXYzine (ATARAX) 50 MG tablet; TAKE 1 TABLET EVERY 8 HOURS AS NEEDED FOR ITCHING    Essential hypertension  -     lisinopril (PRINIVIL;ZESTRIL) 5 MG tablet; TAKE 1 TABLET DAILY  -     amLODIPine (NORVASC) 5 MG tablet; TAKE 1 TABLET DAILY    Mixed hyperlipidemia    Other orders  -     Cancel: zolpidem (AMBIEN) 5 MG tablet; Take 1 tablet by mouth nightly as needed for Sleep for up to 90  days..  -     Cancel: meloxicam (MOBIC) 15 MG tablet; TAKE 1 TABLET DAILY  -     atorvastatin (LIPITOR) 20 MG tablet; Take 1 tablet by mouth daily           Plan:      Patient Instructions   Consider sleep apnea referral   Return in about 6 months (around 12/13/2017).          Merrie Roofaylor N Marsh, MA

## 2017-07-05 ENCOUNTER — Institutional Professional Consult (permissible substitution): Payer: 59 | Admitting: Pulmonary Disease

## 2017-07-11 NOTE — Telephone Encounter (Signed)
error 

## 2017-07-14 ENCOUNTER — Other Ambulatory Visit: Payer: Self-pay | Admitting: Internal Medicine

## 2017-07-14 DIAGNOSIS — Z1231 Encounter for screening mammogram for malignant neoplasm of breast: Secondary | ICD-10-CM

## 2017-08-14 ENCOUNTER — Encounter

## 2017-08-14 MED ORDER — MELOXICAM 15 MG PO TABS
15 MG | ORAL_TABLET | ORAL | 1 refills | Status: DC
Start: 2017-08-14 — End: 2018-02-10

## 2017-08-14 NOTE — Telephone Encounter (Signed)
Refill Request for meloxicam (MOBIC) 15 MG tablet     Last visit- 06/15/17    Told to follow up- 6 months    Pending appointment- None    Additional Comments -

## 2017-08-16 ENCOUNTER — Ambulatory Visit
Admission: RE | Admit: 2017-08-16 | Discharge: 2017-08-16 | Disposition: A | Discharge status: Home / Self Care | Payer: 59 | Source: Ambulatory Visit | Attending: Internal Medicine | Admitting: Internal Medicine

## 2017-08-16 ENCOUNTER — Other Ambulatory Visit: Payer: Self-pay | Admitting: Internal Medicine

## 2017-08-16 DIAGNOSIS — Z1231 Encounter for screening mammogram for malignant neoplasm of breast: Secondary | ICD-10-CM | POA: Diagnosis not present

## 2017-09-07 ENCOUNTER — Encounter

## 2017-09-07 MED ORDER — ZOLPIDEM TARTRATE 5 MG PO TABS
5 | ORAL_TABLET | Freq: Every evening | ORAL | 1 refills | Status: DC | PRN
Start: 2017-09-07 — End: 2018-01-02

## 2017-09-07 MED ORDER — NYAMYC 100000 UNIT/GM EX POWD
100000 UNIT/GM | CUTANEOUS | 3 refills | Status: DC
Start: 2017-09-07 — End: 2018-05-03

## 2017-09-07 MED ORDER — CITALOPRAM HYDROBROMIDE 40 MG PO TABS
40 MG | ORAL_TABLET | ORAL | 0 refills | Status: DC
Start: 2017-09-07 — End: 2018-01-05

## 2017-09-07 MED ORDER — CYCLOBENZAPRINE HCL 10 MG PO TABS
10 MG | ORAL_TABLET | ORAL | 0 refills | Status: DC
Start: 2017-09-07 — End: 2018-01-02

## 2017-09-07 NOTE — Telephone Encounter (Signed)
Patient states that she is not liking the new sleeping medication and she wants to go back on the [place]    Patient last seen on 06/15/17. Advised to follow up 6 months. Next appointment none.

## 2017-09-07 NOTE — Telephone Encounter (Signed)
done

## 2017-09-07 NOTE — Telephone Encounter (Signed)
Refill Request for Celexa 40 MG Flexeril 10 MG Nyamyc     Last visit- 06/15/17    Told to follow up- 6 months    Pending appointment- None    Additional Comments -

## 2017-09-07 NOTE — Telephone Encounter (Signed)
Patient informed.

## 2017-09-19 ENCOUNTER — Ambulatory Visit: Payer: Self-pay | Admitting: *Deleted

## 2017-09-19 NOTE — Telephone Encounter (Signed)
Pt reports fever since Sunday, max 101.0, this am 98.9, presently 100.9.  Reports non-productive cough, sneezing,  "scratchy" throat but "I think it\'s from post nasal drip."  Denies earache, sinus tenderness. No dizziness, weakness. States is staying hydrated."Little achy."  Is taking ibuprofen, last took 1.5 hours ago. Has been around coworkers with similar symptoms. Home care advice given per protocol. Instructed to call back if fever persists, symptoms worsen. Reason for Disposition . [1] Fever AND [2] no signs of serious infection or localizing symptoms (all other triage questions negative)  Answer Assessment - Initial Assessment Questions 1. TEMPERATURE: "What is the most recent temperature?"  "How was it measured?"      10 0.9 few minutes ago at 1450 2. ONSET: "When did the fever start?"      Saturday afternoon  Max of 101.0 3. SYMPTOMS: "Do you have any other symptoms besides the fever?"  (e.g., colds, headache, sore throat, earache, cough, rash, diarrhea, vomiting, abdominal pain)     Non productive cough, sore throat, post nasal drip, mild headache, achy. 4. CAUSE: If there are no symptoms, ask: "What do you think is causing the fever?"      Cold symptoms 5. CONTACTS: "Does anyone else in the family have an infection?"     Coworkers 6. TREATMENT: "What have you done so far to treat this fever?" (e.g., medications)     Took ibuprofen 7. IMMUNOCOMPROMISE: "Do you have of the following: diabetes, HIV positive, splenectomy, cancer chemotherapy, chronic steroid treatment, transplant patient, etc."     No 8. PREGNANCY: "Is there any chance you are pregnant?" "When was your last menstrual period?"     No 9. TRAVEL: "Have you traveled out of the country in the last month?" (e.g., travel history, exposures)     no  Protocols used: FEVER-A-AH

## 2017-09-20 ENCOUNTER — Ambulatory Visit: Payer: 59 | Admitting: Internal Medicine

## 2017-09-20 ENCOUNTER — Encounter: Payer: Self-pay | Admitting: Internal Medicine

## 2017-09-20 VITALS — BP 160/104 | HR 84 | Temp 99.3°F | Resp 18 | Wt 146.0 lb

## 2017-09-20 DIAGNOSIS — I10 Essential (primary) hypertension: Secondary | ICD-10-CM

## 2017-09-20 DIAGNOSIS — B349 Viral infection, unspecified: Secondary | ICD-10-CM

## 2017-09-20 DIAGNOSIS — R509 Fever, unspecified: Secondary | ICD-10-CM | POA: Diagnosis not present

## 2017-09-20 LAB — POC INFLUENZA A&B (BINAX/QUICKVUE)
INFLUENZA A, POC: NEGATIVE
INFLUENZA B, POC: NEGATIVE

## 2017-09-20 NOTE — Patient Instructions (Signed)
Take tylenol alternating with advil.  Take over the cold medications as needed.  Get lots of rest and fluids.   Call if no improvement     Viral Illness, Adult Viruses are tiny germs that can get into a person's body and cause illness. There are many different types of viruses, and they cause many types of illness. Viral illnesses can range from mild to severe. They can affect various parts of the body. Common illnesses that are caused by a virus include colds and the flu. Viral illnesses also include serious conditions such as HIV/AIDS (human immunodeficiency virus/acquired immunodeficiency syndrome). A few viruses have been linked to certain cancers. What are the causes? Many types of viruses can cause illness. Viruses invade cells in your body, multiply, and cause the infected cells to malfunction or die. When the cell dies, it releases more of the virus. When this happens, you develop symptoms of the illness, and the virus continues to spread to other cells. If the virus takes over the function of the cell, it can cause the cell to divide and grow out of control, as is the case when a virus causes cancer. Different viruses get into the body in different ways. You can get a virus by:  Swallowing food or water that is contaminated with the virus.  Breathing in droplets that have been coughed or sneezed into the air by an infected person.  Touching a surface that has been contaminated with the virus and then touching your eyes, nose, or mouth.  Being bitten by an insect or animal that carries the virus.  Having sexual contact with a person who is infected with the virus.  Being exposed to blood or fluids that contain the virus, either through an open cut or during a transfusion.  If a virus enters your body, your body's defense system (immune system) will try to fight the virus. You may be at higher risk for a viral illness if your immune system is weak. What are the signs or  symptoms? Symptoms vary depending on the type of virus and the location of the cells that it invades. Common symptoms of the main types of viral illnesses include: Cold and flu viruses  Fever.  Headache.  Sore throat.  Muscle aches.  Nasal congestion.  Cough. Digestive system (gastrointestinal) viruses  Fever.  Abdominal pain.  Nausea.  Diarrhea. Liver viruses (hepatitis)  Loss of appetite.  Tiredness.  Yellowing of the skin (jaundice). Brain and spinal cord viruses  Fever.  Headache.  Stiff neck.  Nausea and vomiting.  Confusion or sleepiness. Skin viruses  Warts.  Itching.  Rash. Sexually transmitted viruses  Discharge.  Swelling.  Redness.  Rash. How is this treated? Viruses can be difficult to treat because they live within cells. Antibiotic medicines do not treat viruses because these drugs do not get inside cells. Treatment for a viral illness may include:  Resting and drinking plenty of fluids.  Medicines to relieve symptoms. These can include over-the-counter medicine for pain and fever, medicines for cough or congestion, and medicines to relieve diarrhea.  Antiviral medicines. These drugs are available only for certain types of viruses. They may help reduce flu symptoms if taken early. There are also many antiviral medicines for hepatitis and HIV/AIDS.  Some viral illnesses can be prevented with vaccinations. A common example is the flu shot. Follow these instructions at home: Medicines   Take over-the-counter and prescription medicines only as told by your health care provider.  If you  were prescribed an antiviral medicine, take it as told by your health care provider. Do not stop taking the medicine even if you start to feel better.  Be aware of when antibiotics are needed and when they are not needed. Antibiotics do not treat viruses. If your health care provider thinks that you may have a bacterial infection as well as a viral  infection, you may get an antibiotic. ? Do not ask for an antibiotic prescription if you have been diagnosed with a viral illness. That will not make your illness go away faster. ? Frequently taking antibiotics when they are not needed can lead to antibiotic resistance. When this develops, the medicine no longer works against the bacteria that it normally fights. General instructions  Drink enough fluids to keep your urine clear or pale yellow.  Rest as much as possible.  Return to your normal activities as told by your health care provider. Ask your health care provider what activities are safe for you.  Keep all follow-up visits as told by your health care provider. This is important. How is this prevented? Take these actions to reduce your risk of viral infection:  Eat a healthy diet and get enough rest.  Wash your hands often with soap and water. This is especially important when you are in public places. If soap and water are not available, use hand sanitizer.  Avoid close contact with friends and family who have a viral illness.  If you travel to areas where viral gastrointestinal infection is common, avoid drinking water or eating raw food.  Keep your immunizations up to date. Get a flu shot every year as told by your health care provider.  Do not share toothbrushes, nail clippers, razors, or needles with other people.  Always practice safe sex.  Contact a health care provider if:  You have symptoms of a viral illness that do not go away.  Your symptoms come back after going away.  Your symptoms get worse. Get help right away if:  You have trouble breathing.  You have a severe headache or a stiff neck.  You have severe vomiting or abdominal pain. This information is not intended to replace advice given to you by your health care provider. Make sure you discuss any questions you have with your health care provider. Document Released: 12/04/2015 Document Revised:  01/06/2016 Document Reviewed: 12/04/2015 Elsevier Interactive Patient Education  [employer].

## 2017-09-20 NOTE — Assessment & Plan Note (Signed)
Blood pressure elevated here today, but typically well controlled She does monitor it at home Discussed that it may be elevated in the setting of an acute illness Stressed the importance of her monitoring it more closely at home to confirm that it is well controlled

## 2017-09-20 NOTE — Assessment & Plan Note (Signed)
Symptoms consistent with a viral infection Flu test negative Likely a different virus causing her symptoms Discussed symptomatic treatment with Tylenol, Advil and over-the-counter cold medications Increase rest and fluids Call if no improvement or if symptoms worsen

## 2017-09-20 NOTE — Progress Notes (Signed)
Subjective:    Patient ID: Gabriela Carrillo, female    DOB: 1956/02/05, 62 y.o.   MRN: [id number]  HPI She is here for an acute visit for cold symptoms.  Her symptoms started 5 days ago.   She is experiencing  fever up to 101, chills, fatigue, decreased appetite, nasal congestion, postnasal drip, minimal sinus pressure, slight sore throat from postnasal drip, cough, myalgias and headaches.  She denies ear pain, sinus pain, shortness breath, wheezing, nausea, diarrhea lightheadedness.  She has taken tylenol and ibuprofen  Medications and allergies reviewed with patient and updated if appropriate.  Patient Active Problem List   Diagnosis Date Noted  . C. difficile diarrhea 06/07/2017  . Diarrhea 06/05/2017  . Sinusitis 05/20/2017  . Headache 05/20/2017  . Essential hypertension, benign 05/03/2016  . 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 Allergic rhinitis 11/11/2015  . Osteopenia 09/09/2013  . Anxiety state 09/04/2009  . Irritable bowel syndrome 09/04/2009  . BREAST CANCER, HX OF 08/07/2008    Current Outpatient Medications on File Prior to Visit  Medication Sig Dispense Refill  . FLUoxetine (PROZAC) 20 MG capsule Take 1 capsule (20 mg total) by mouth daily. 30 capsule 5  . losartan (COZAAR) 50 MG tablet Take 1 tablet (50 mg total) by mouth daily. 30 tablet 5  . Multiple Vitamins-Minerals (MULTIVITAMIN) tablet Take 1 tablet by mouth daily.     No current facility-administered medications on file prior to visit.     Past Medical History:  Diagnosis Date  . Allergy to sulfa drugs    itching w/o rash  . Cancer (HCC)    BREAST,PMH OF  . Depression    PMH of  . 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 Headache 05/20/2017   Encouraged increased hydration, 64 ounces of clear fluids daily. Minimize alcohol and caffeine. Eat small frequent meals with lean proteins and complex carbs. Avoid high and low blood sugars. Get adequate sleep, 7-8 hours a night. Needs exercise daily  preferably in the morning. This is the first time she has had migrainous symptoms with N/V but denies photophobia, phonophobia or other neurologic c  . Hypertension    Has been taking Losartan for about a year.  . Hypokalemia    PMH OF    Past Surgical History:  Procedure Laterality Date  . BREAST LUMPECTOMY Left 2004   CHEMOTHERAPY  FOLLOWED BY RADIATION  . BREAST RECONSTRUCTION Left 12/18/2014   Procedure: DELAYED LEFT BREAST RECONSTRUCTION WITH PLACEMENT OF GEL  IMPLANT;  Surgeon: Crissie Reese, MD;  Location: [facility];  Service: Plastics;  Laterality: Left;  . BUNIONECTOMY BILATERALLY    . CESAREAN SECTION    . COLONOSCOPY  2011   negative  . G3 P2    . PLACEMENT OF BREAST IMPLANTS     left    Social History   Socioeconomic History  . Marital status: Married    Spouse name: None  . Number of children: None  . Years of education: None  . Highest education level: None  Social Needs  . Financial resource strain: None  . Food insecurity - worry: None  . Food insecurity - inability: None  . Transportation needs - medical: None  . Transportation needs - non-medical: None  Occupational History  . None  Tobacco Use  . Smoking status: Never Smoker  . Smokeless tobacco: Never Used  Substance and Sexual Activity  . Alcohol use: Yes    Comment:  2-3 per week  .  Drug use: No  . Sexual activity: None  Other Topics Concern  . None  Social History Narrative   WALKS DAILY 40-45 MINS    Family History  Problem Relation Age of Onset  . Diabetes Mother   . Anxiety disorder Mother   . Goiter Mother   . Hypertension Sister        [id number]  . Arthritis Sister        Rheumatoid   . Cancer Neg Hx   . Heart disease Neg Hx   . Stroke Neg Hx   . Colon cancer Neg Hx     Review of Systems  Constitutional: Positive for appetite change (decreased), chills, fatigue and fever.  HENT: Positive for congestion (mild), postnasal drip, sinus pressure (mild) and sore throat (mild). Negative for  ear pain and sinus pain.   Respiratory: Positive for cough (from PND). Negative for chest tightness, shortness of breath and wheezing.   Cardiovascular: Negative for chest pain.  Gastrointestinal: Negative for diarrhea and nausea.  Musculoskeletal: Positive for myalgias.  Neurological: Positive for headaches. Negative for dizziness and light-headedness.       Objective:   Vitals:   09/20/17 1558  BP: (!) 160/104  Pulse: 84  Resp: 18  Temp: 99.3 F (37.4 C)  SpO2: 98%   Filed Weights   09/20/17 1558  Weight: 146 lb (66.2 kg)   Body mass index is 25.06 kg/m.  Wt Readings from Last 3 Encounters:  09/20/17 146 lb (66.2 kg)  06/05/17 144 lb (65.3 kg)  05/20/17 151 lb (68.5 kg)     Physical Exam GENERAL APPEARANCE: Appears stated age, well appearing, NAD EYES: conjunctiva clear, no icterus HEENT: bilateral tympanic membranes and ear canals normal, oropharynx with no erythema, no thyromegaly, trachea midline, no cervical or supraclavicular lymphadenopathy LUNGS: Clear to auscultation without wheeze or crackles, unlabored breathing, good air entry bilaterally CARDIOVASCULAR: Normal S1,S2 without murmurs, no edema SKIN: warm, dry        Assessment & Plan:   See Problem List for Assessment and Plan of chronic medical problems.

## 2017-10-09 DIAGNOSIS — L814 Other melanin hyperpigmentation: Secondary | ICD-10-CM | POA: Diagnosis not present

## 2017-10-09 DIAGNOSIS — L820 Inflamed seborrheic keratosis: Secondary | ICD-10-CM | POA: Diagnosis not present

## 2017-10-09 DIAGNOSIS — D1801 Hemangioma of skin and subcutaneous tissue: Secondary | ICD-10-CM | POA: Diagnosis not present

## 2017-10-09 DIAGNOSIS — L570 Actinic keratosis: Secondary | ICD-10-CM | POA: Diagnosis not present

## 2017-10-09 DIAGNOSIS — L821 Other seborrheic keratosis: Secondary | ICD-10-CM | POA: Diagnosis not present

## 2017-10-20 ENCOUNTER — Ambulatory Visit (INDEPENDENT_AMBULATORY_CARE_PROVIDER_SITE_OTHER): Payer: 59

## 2017-10-20 DIAGNOSIS — Z23 Encounter for immunization: Secondary | ICD-10-CM | POA: Diagnosis not present

## 2017-10-20 NOTE — Progress Notes (Signed)
Injection given.   Gabriela Carrillo J Lovinia Snare, MD  

## 2017-10-24 ENCOUNTER — Telehealth: Payer: Self-pay | Admitting: Internal Medicine

## 2017-10-24 ENCOUNTER — Encounter: Payer: Self-pay | Admitting: Emergency Medicine

## 2017-10-24 NOTE — Telephone Encounter (Signed)
Letter has been faxed.

## 2017-10-24 NOTE — Telephone Encounter (Signed)
Copied from [place]. Topic: Quick Communication - See Telephone Encounter >> Oct 24, 2017  3:26 PM Robina Ade, Helene Kelp D wrote: CRM for notification. See Telephone encounter for: 10/24/17. Patient called and said that if she can have a work note saying when she has her CPE with a doctor signature. Her fax number is [phone]. Please call patient with any questions.

## 2017-11-07 ENCOUNTER — Encounter

## 2017-11-07 MED ORDER — HYDROXYZINE HCL 50 MG PO TABS
50 MG | ORAL_TABLET | ORAL | 1 refills | Status: DC
Start: 2017-11-07 — End: 2018-01-02

## 2017-11-07 NOTE — Telephone Encounter (Signed)
Patient last seen on 06/15/18. Advised to follow up 6 months. Next appointment none.

## 2017-11-19 ENCOUNTER — Other Ambulatory Visit: Payer: Self-pay | Admitting: Internal Medicine

## 2017-12-19 DIAGNOSIS — M25561 Pain in right knee: Secondary | ICD-10-CM | POA: Diagnosis not present

## 2017-12-19 DIAGNOSIS — M1711 Unilateral primary osteoarthritis, right knee: Secondary | ICD-10-CM | POA: Diagnosis not present

## 2017-12-27 ENCOUNTER — Encounter

## 2017-12-27 NOTE — Telephone Encounter (Signed)
Left message on pt's to call back. She needs to be seen before any medications can be filled.

## 2017-12-27 NOTE — Telephone Encounter (Signed)
Refill Request for cyclobenzaprine (FLEXERIL) 10 MG tablet     Last visit- 06/15/17    Told to follow up- 6 months    Pending appointment- None    Additional Comments -

## 2017-12-27 NOTE — Telephone Encounter (Signed)
Patient returned call. Made an appointment for 01/02/18

## 2018-01-02 ENCOUNTER — Ambulatory Visit: Admit: 2018-01-02 | Discharge: 2018-01-02 | Payer: PRIVATE HEALTH INSURANCE | Attending: Family Medicine

## 2018-01-02 DIAGNOSIS — E782 Mixed hyperlipidemia: Secondary | ICD-10-CM

## 2018-01-02 MED ORDER — ZOLPIDEM TARTRATE 5 MG PO TABS
5 MG | ORAL_TABLET | Freq: Every evening | ORAL | 1 refills | Status: DC | PRN
Start: 2018-01-02 — End: 2018-01-08

## 2018-01-02 MED ORDER — HYDROXYZINE HCL 50 MG PO TABS
50 MG | ORAL_TABLET | ORAL | 1 refills | Status: DC
Start: 2018-01-02 — End: 2018-01-05

## 2018-01-02 MED ORDER — ATORVASTATIN CALCIUM 20 MG PO TABS
20 MG | ORAL_TABLET | Freq: Every day | ORAL | 1 refills | Status: DC
Start: 2018-01-02 — End: 2018-07-12

## 2018-01-02 MED ORDER — TRAMADOL HCL 50 MG PO TABS
50 MG | ORAL_TABLET | Freq: Four times a day (QID) | ORAL | 1 refills | Status: DC | PRN
Start: 2018-01-02 — End: 2018-01-11

## 2018-01-02 MED ORDER — AMLODIPINE BESYLATE 5 MG PO TABS
5 MG | ORAL_TABLET | ORAL | 1 refills | Status: DC
Start: 2018-01-02 — End: 2018-07-12

## 2018-01-02 MED ORDER — CYCLOBENZAPRINE HCL 10 MG PO TABS
10 MG | ORAL_TABLET | ORAL | 1 refills | Status: DC
Start: 2018-01-02 — End: 2018-07-12

## 2018-01-02 MED ORDER — LISINOPRIL 5 MG PO TABS
5 MG | ORAL_TABLET | ORAL | 1 refills | Status: DC
Start: 2018-01-02 — End: 2018-07-12

## 2018-01-02 NOTE — Progress Notes (Signed)
Subjective:      Patient ID: Cassandra Copeland is a 62 y.o. female.    HPI   Chief Complaint   Patient presents with   ??? Hyperlipidemia-no problem with compliance;no myalgias;      not fasting    ??? Hypertension-Denies cv/cns/claud sx    ??? Insomnia-stable;no hangover   ??? Back Pain-glluteal pain which keeps her up;lots of gardening;no rad sx's;     takes the tramadol for this it is worse during the summer months when she is out working in the yard   ??? Asthma-uses rescue inhaler during this time of year-allergies plus really works out in the yard a lot    ??? Anxiety= control      Chief complaint and present illness: 62 year old white female presents unaccompanied for routine follow-up    Review of Systems   All other systems reviewed and are negative.    background/entire past medical,social and family history obtained and reviewed/updated today   Objective:   Physical Exam   Constitutional: She appears well-developed and well-nourished.   Cardiovascular: Normal rate, regular rhythm, normal heart sounds and intact distal pulses.   No murmur heard.  Pulmonary/Chest: Effort normal and breath sounds normal. She has no wheezes. She has no rales.   Musculoskeletal: She exhibits no edema.   Skin: Skin is warm.   Nursing note and vitals reviewed.    BP 122/80    Pulse 76    Ht  (1.6 m)    Wt 199 lb (90.3 kg)    SpO2 97%    BMI 35.25 kg/m??   Controlled Substances Monitoring:     RX Monitoring 01/03/2018   Attestation The Prescription Monitoring Report for this patient was reviewed today.   Chronic Pain Routine Monitoring Possible medication side effects, risk of tolerance/dependence & alternative treatments discussed.;Obtaining appropriate analgesic effect of treatment.;No signs of potential drug abuse or diversion identified: otherwise, see note documentation   Chronic Pain > 50 MEDD Re-evaluated the status of the patient's underlying condition causing pain.   Chronic Pain > 80 MEDD Looked for signs of prescription misuse.       Assessment:      Aeron was seen today for hyperlipidemia, hypertension, insomnia, back pain, asthma and anxiety.    Diagnoses and all orders for this visit:    Mixed hyperlipidemia  -     Lipid Panel  -     Comprehensive Metabolic Panel    Essential hypertension  -     amLODIPine (NORVASC) 5 MG tablet; TAKE 1 TABLET DAILY  -     lisinopril (PRINIVIL;ZESTRIL) 5 MG tablet; TAKE 1 TABLET DAILY  -     Lipid Panel  -     Comprehensive Metabolic Panel    Fibromyalgia  -     cyclobenzaprine (FLEXERIL) 10 MG tablet; TAKE 1 TABLET NIGHTLY  -     hydrOXYzine (ATARAX) 50 MG tablet; Prn itching  -     traMADol (ULTRAM) 50 MG tablet; Take 1 tablet by mouth every 6 hours as needed for Pain for up to 30 doses.    Depression, unspecified depression type    Primary insomnia  -     zolpidem (AMBIEN) 5 MG tablet; Take 1 tablet by mouth nightly as needed for Sleep for up to 90 days.    Insomnia, unspecified type    Other orders  -     atorvastatin (LIPITOR) 20 MG tablet; Take 1 tablet by mouth daily  Plan:      Return in about 6 months (around 07/05/2018).  There are no Patient Instructions on file for this visit.         Franciso Bend, MA

## 2018-01-02 NOTE — Progress Notes (Signed)
Lab preformed in L hand and sent number of tubes below to be processed.   1 attempt made and stopped bleeding by applying band aide and guaze.     1 Red    0 purple    0 blue    0 Urine

## 2018-01-03 LAB — COMPREHENSIVE METABOLIC PANEL
ALT: 11 U/L (ref 10–40)
AST: 22 U/L (ref 15–37)
Albumin/Globulin Ratio: 2.1 (ref 1.1–2.2)
Albumin: 4.9 g/dL (ref 3.4–5.0)
Alkaline Phosphatase: 98 U/L (ref 40–129)
Anion Gap: 15 (ref 3–16)
BUN: 13 mg/dL (ref 7–20)
CO2: 25 mmol/L (ref 21–32)
Calcium: 10.1 mg/dL (ref 8.3–10.6)
Chloride: 103 mmol/L (ref 99–110)
Creatinine: 0.7 mg/dL (ref 0.6–1.2)
GFR African American: >60 (ref >60)
GFR Non-African American: >60 (ref >60)
Globulin: 2.3 g/dL
Glucose: 100 mg/dL — ABNORMAL HIGH (ref 70–99)
Potassium: 4.3 mmol/L (ref 3.5–5.1)
Sodium: 143 mmol/L (ref 136–145)
Total Bilirubin: 0.4 mg/dL (ref 0.0–1.0)
Total Protein: 7.2 g/dL (ref 6.4–8.2)

## 2018-01-03 LAB — LIPID PANEL
Cholesterol, Total: 165 mg/dL (ref 0–199)
HDL: 56 mg/dL (ref 40–60)
LDL Calculated: 68 mg/dL (ref <100)
Triglycerides: 206 mg/dL — ABNORMAL HIGH (ref 0–150)
VLDL Cholesterol Calculated: 41 mg/dL

## 2018-01-05 ENCOUNTER — Encounter

## 2018-01-05 MED ORDER — CITALOPRAM HYDROBROMIDE 40 MG PO TABS
40 MG | ORAL_TABLET | ORAL | 1 refills | Status: AC
Start: 2018-01-05 — End: ?

## 2018-01-05 MED ORDER — DOXEPIN HCL 10 MG PO CAPS
10 MG | ORAL_CAPSULE | ORAL | 1 refills | Status: DC
Start: 2018-01-05 — End: 2018-07-12

## 2018-01-05 MED ORDER — HYDROXYZINE HCL 50 MG PO TABS
50 MG | ORAL_TABLET | ORAL | 1 refills | Status: DC
Start: 2018-01-05 — End: 2018-05-03

## 2018-01-05 NOTE — Telephone Encounter (Signed)
Last seen 01/02/18, told to fu in 6 months.  No fu on file

## 2018-01-08 ENCOUNTER — Encounter

## 2018-01-08 MED ORDER — ZOLPIDEM TARTRATE 5 MG PO TABS
5 MG | ORAL_TABLET | Freq: Every evening | ORAL | 1 refills | Status: DC | PRN
Start: 2018-01-08 — End: 2018-01-11

## 2018-01-08 NOTE — Telephone Encounter (Signed)
Called and spoke with Andrey [facility]andy [facility], they were having trouble refilling patient's Zolpidem medication. They stated the insurance will only pay for the medication if it's written like this...        Max daily Dose # 30 with a 23 day period or max daily dose #90 with a 69 day period.    Please advise which one you want to write for. Will pend the medication.

## 2018-01-10 ENCOUNTER — Encounter

## 2018-01-10 NOTE — Telephone Encounter (Signed)
Patient called back, said to disregard. The insurance company called back and informed her they will cover it.

## 2018-01-10 NOTE — Telephone Encounter (Signed)
Tramadol 50 MG, Ambien 5 MG[facility]    [facility], but patient is leaving for vacation on Friday and they will not be here in time. Requesting they be sent to local pharmacy    Last OV- 01/02/18 fu 540m     Next OV- None

## 2018-01-10 NOTE — Telephone Encounter (Signed)
On 01-02-18 dr spaccarelli prescribed and [facility]. Patient says insurance refused to fill. Asking if dr Salome Spottedspaccarelli would send [facility] to [facility]. She will pay out of pocket.

## 2018-01-11 MED ORDER — ZOLPIDEM TARTRATE 5 MG PO TABS
5 MG | ORAL_TABLET | Freq: Every evening | ORAL | 0 refills | Status: DC | PRN
Start: 2018-01-11 — End: 2018-07-12

## 2018-01-11 MED ORDER — TRAMADOL HCL 50 MG PO TABS
50 MG | ORAL_TABLET | Freq: Four times a day (QID) | ORAL | 0 refills | Status: AC | PRN
Start: 2018-01-11 — End: 2018-02-10

## 2018-01-11 NOTE — Telephone Encounter (Signed)
Patient is leaving on 6/8 and wont be back till 6/27, she is out of the tramadol now, and she has 3 ambien right now.  [facility]

## 2018-01-11 NOTE — Telephone Encounter (Signed)
I will only give her enough to last the vacation time-how long is she going to be gone?

## 2018-01-11 NOTE — Telephone Encounter (Signed)
PA submitted for Tramadol  On a PA form.  Completed and faxed back.    Pending review.

## 2018-01-11 NOTE — Telephone Encounter (Signed)
Patient notified

## 2018-01-22 NOTE — Telephone Encounter (Signed)
Approval scanned in Media.

## 2018-02-10 ENCOUNTER — Encounter

## 2018-02-12 MED ORDER — MELOXICAM 15 MG PO TABS
15 MG | ORAL_TABLET | ORAL | 1 refills | Status: DC
Start: 2018-02-12 — End: 2018-08-13

## 2018-02-12 MED ORDER — MONTELUKAST SODIUM 10 MG PO TABS
10 MG | ORAL_TABLET | ORAL | 3 refills | Status: AC
Start: 2018-02-12 — End: ?

## 2018-02-12 NOTE — Telephone Encounter (Signed)
Refill Request for Meloxicam 15 MG     Last visit- 01/02/18    Told to follow up- 6 months    Pending appointment- None    Additional Comments -

## 2018-02-12 NOTE — Telephone Encounter (Signed)
Last visit- 01/02/18  ??  Told to follow up- 6 months  ??  Pending appointment- None  ??  Additional Comments -   ??

## 2018-02-28 ENCOUNTER — Encounter: Payer: Self-pay | Admitting: Family Medicine

## 2018-03-19 ENCOUNTER — Encounter: Payer: Self-pay | Admitting: Internal Medicine

## 2018-03-19 DIAGNOSIS — Z9889 Other specified postprocedural states: Secondary | ICD-10-CM | POA: Insufficient documentation

## 2018-03-19 NOTE — Telephone Encounter (Signed)
printed

## 2018-03-22 ENCOUNTER — Encounter: Payer: Self-pay | Admitting: Internal Medicine

## 2018-03-26 DIAGNOSIS — C50912 Malignant neoplasm of unspecified site of left female breast: Secondary | ICD-10-CM | POA: Diagnosis not present

## 2018-04-22 ENCOUNTER — Other Ambulatory Visit: Payer: Self-pay | Admitting: Internal Medicine

## 2018-04-23 ENCOUNTER — Encounter: Payer: Self-pay | Admitting: Internal Medicine

## 2018-04-23 NOTE — Telephone Encounter (Signed)
She needs make an appointment.

## 2018-04-23 NOTE — Progress Notes (Signed)
Subjective:    Patient ID: Gabriela Carrillo, female    DOB: Dec 26, 1955, 62 y.o.   MRN: [id number]  HPI The patient is here for an acute visit.  Hypertension: She is taking her medication daily. She is compliant with a low sodium diet.  She denies chest pain, palpitations, edema, shortness of breath and regular headaches. She is exercising minimally.  She does monitor her blood pressure at home and her BP has been elevated for the past couple of weeks, prior to that she thought it was controlled although it was high here at times.  The BP seemed to be higher after a fall at work two weeks ago.  Her BP at home has been on average 151/84 today ( 152/96, 147/82)     Anxiety: She is taking her medication daily as prescribed. She denies any side effects from the medication. She feels her anxiety is well controlled and she is happy with her current dose of medication.   Medications and allergies reviewed with patient and updated if appropriate.  Patient Active Problem List   Diagnosis Date Noted  . S/P lumpectomy of breast 03/19/2018  . C. difficile diarrhea 06/07/2017  . Headache 05/20/2017  . Uncontrolled hypertension 05/03/2016  . 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 Allergic rhinitis 11/11/2015  . Osteopenia 09/09/2013  . Anxiety state 09/04/2009  . Irritable bowel syndrome 09/04/2009  . BREAST CANCER, HX OF 08/07/2008    No current outpatient medications on file prior to visit.   No current facility-administered medications on file prior to visit.     Past Medical History:  Diagnosis Date  . Allergy to sulfa drugs    itching w/o rash  . Cancer (HCC)    BREAST,PMH OF  . Depression    PMH of  . 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 Headache 05/20/2017   Encouraged increased hydration, 64 ounces of clear fluids daily. Minimize alcohol and caffeine. Eat small frequent meals with lean proteins and complex carbs. Avoid high and low blood sugars. Get adequate sleep, 7-8 hours a  night. Needs exercise daily preferably in the morning. This is the first time she has had migrainous symptoms with N/V but denies photophobia, phonophobia or other neurologic c  . Hypertension    Has been taking Losartan for about a year.  . Hypokalemia    PMH OF    Past Surgical History:  Procedure Laterality Date  . BREAST LUMPECTOMY Left 2004   CHEMOTHERAPY  FOLLOWED BY RADIATION  . BREAST RECONSTRUCTION Left 12/18/2014   Procedure: DELAYED LEFT BREAST RECONSTRUCTION WITH PLACEMENT OF GEL  IMPLANT;  Surgeon: Crissie Reese, MD;  Location: [place];  Service: Plastics;  Laterality: Left;  . BUNIONECTOMY BILATERALLY    . CESAREAN SECTION    . COLONOSCOPY  2011   negative  . G3 P2    . PLACEMENT OF BREAST IMPLANTS     left    Social History   Socioeconomic History  . Marital status: Married    Spouse name: Not on file  . Number of children: Not on file  . Years of education: Not on file  . Highest education level: Not on file  Occupational History  . Not on file  Social Needs  . Financial resource strain: Not on file  . Food insecurity:    Worry: Not on file    Inability: Not on file  . Transportation needs:    Medical: Not on file  Non-medical: Not on file  Tobacco Use  . Smoking status: Never Smoker  . Smokeless tobacco: Never Used  Substance and Sexual Activity  . Alcohol use: Yes    Comment:  2-3 per week  . Drug use: No  . Sexual activity: Not on file  Lifestyle  . Physical activity:    Days per week: Not on file    Minutes per session: Not on file  . Stress: Not on file  Relationships  . Social connections:    Talks on phone: Not on file    Gets together: Not on file    Attends religious service: Not on file    Active member of club or organization: Not on file    Attends meetings of clubs or organizations: Not on file    Relationship status: Not on file  Other Topics Concern  . Not on file  Social History Narrative   WALKS DAILY 40-45 MINS     Family History  Problem Relation Age of Onset  . Diabetes Mother   . Anxiety disorder Mother   . Goiter Mother   . Hypertension Sister        [id number]  . Arthritis Sister        Rheumatoid   . Cancer Neg Hx   . Heart disease Neg Hx   . Stroke Neg Hx   . Colon cancer Neg Hx     Review of Systems  Constitutional: Negative for fever.  Respiratory: Negative for cough, shortness of breath and wheezing.   Cardiovascular: Negative for chest pain, palpitations and leg swelling.  Neurological: Positive for light-headedness (sometimes) and headaches (mild frontal region right now - occurs occasionally). Negative for dizziness.       Objective:   Vitals:   04/24/18 1314  BP: (!) 152/90  Pulse: 93  Resp: 16  Temp: 98.5 F (36.9 C)  SpO2: 97%   BP Readings from Last 3 Encounters:  04/24/18 (!) 152/90  09/20/17 (!) 160/104  06/05/17 132/88   Wt Readings from Last 3 Encounters:  04/24/18 150 lb (68 kg)  09/20/17 146 lb (66.2 kg)  06/05/17 144 lb (65.3 kg)   Body mass index is 25.75 kg/m.   Physical Exam    Constitutional: Appears well-developed and well-nourished. No distress.  HENT:  Head: Normocephalic and atraumatic.  Neck: Neck supple. No tracheal deviation present. No thyromegaly present.  No cervical lymphadenopathy Cardiovascular: Normal rate, regular rhythm and normal heart sounds.   No murmur heard. No carotid bruit .  No edema Pulmonary/Chest: Effort normal and breath sounds normal. No respiratory distress. No has no wheezes. No rales.  Skin: Skin is warm and dry. Not diaphoretic.  Psychiatric: Normal mood and affect. Behavior is normal.       Assessment & Plan:    See Problem List for Assessment and Plan of chronic medical problems.

## 2018-04-24 ENCOUNTER — Encounter: Payer: Self-pay | Admitting: Internal Medicine

## 2018-04-24 ENCOUNTER — Ambulatory Visit: Payer: 59 | Admitting: Internal Medicine

## 2018-04-24 VITALS — BP 152/90 | HR 93 | Temp 98.5°F | Resp 16 | Ht 64.0 in | Wt 150.0 lb

## 2018-04-24 DIAGNOSIS — F411 Generalized anxiety disorder: Secondary | ICD-10-CM

## 2018-04-24 DIAGNOSIS — I10 Essential (primary) hypertension: Secondary | ICD-10-CM | POA: Diagnosis not present

## 2018-04-24 MED ORDER — LOSARTAN POTASSIUM 100 MG PO TABS: 100.0000 mg | ORAL_TABLET | Freq: Every day | ORAL | 3 refills | Status: DC

## 2018-04-24 MED ORDER — FLUOXETINE HCL 20 MG PO CAPS: 20.0000 mg | ORAL_CAPSULE | Freq: Every day | ORAL | 1 refills | Status: DC

## 2018-04-24 NOTE — Assessment & Plan Note (Signed)
Blood pressure poorly controlled-it has been high here, but the past 2 weeks it is also been high at home Increase losartan 100 mg daily If this is not effective we will consider adding hydrochlorothiazide or amlodipine Follow-up 2-3 weeks-we will do blood work at that time Increase exercise, continue low-sodium diet

## 2018-04-24 NOTE — Assessment & Plan Note (Addendum)
Controlled, stable Having nightmares - ?  Side effect from medication.  She is able to tolerate this Continue current dose of medication

## 2018-04-24 NOTE — Patient Instructions (Addendum)
  Medications reviewed and updated.  Changes include increasing the losartan to 100 mg daily  Your prescription(s) have been submitted to your pharmacy. Please take as directed and contact our office if you believe you are having problem(s) with the medication(s).   Please followup in 2-3 weeks   Monitor your BP - goal is less 140/90 consistently.

## 2018-04-26 DIAGNOSIS — C50912 Malignant neoplasm of unspecified site of left female breast: Secondary | ICD-10-CM | POA: Diagnosis not present

## 2018-05-03 ENCOUNTER — Encounter

## 2018-05-03 MED ORDER — NYAMYC 100000 UNIT/GM EX POWD
100000 UNIT/GM | CUTANEOUS | 1 refills | Status: AC
Start: 2018-05-03 — End: ?

## 2018-05-03 MED ORDER — HYDROXYZINE HCL 50 MG PO TABS
50 MG | ORAL_TABLET | ORAL | 2 refills | Status: DC
Start: 2018-05-03 — End: 2018-07-12

## 2018-05-03 NOTE — Telephone Encounter (Signed)
Refill Request for Hydroxyzine 50 MG Nyamyc 100000    Last visit- 01/02/18    Told to follow up- 6 months    Pending appointment- None    Additional Comments -

## 2018-05-14 ENCOUNTER — Encounter: Payer: Self-pay | Admitting: Internal Medicine

## 2018-05-14 NOTE — Patient Instructions (Addendum)
Tests ordered today. Your results will be released to [place] (or called to you) after review, usually within 72hours after test completion. If any changes need to be made, you will be notified at that same time.  All other Health Maintenance issues reviewed.   All recommended immunizations and age-appropriate screenings are up-to-date or discussed.  Flu immunization administered today.    Medications reviewed and updated.  Changes include :   Amlodipine 2.5 mg daily.  Your prescription(s) have been submitted to your pharmacy. Please take as directed and contact our office if you believe you are having problem(s) with the medication(s).   Please followup in 6 months, sooner if needed    Health Maintenance, Female Adopting a healthy lifestyle and getting preventive care can go a long way to promote health and wellness. Talk with your health care provider about what schedule of regular examinations is right for you. This is a good chance for you to check in with your provider about disease prevention and staying healthy. In between checkups, there are plenty of things you can do on your own. Experts have done a lot of research about which lifestyle changes and preventive measures are most likely to keep you healthy. Ask your health care provider for more information. Weight and diet Eat a healthy diet  Be sure to include plenty of vegetables, fruits, low-fat dairy products, and lean protein.  Do not eat a lot of foods high in solid fats, added sugars, or salt.  Get regular exercise. This is one of the most important things you can do for your health. ? Most adults should exercise for at least 150 minutes each week. The exercise should increase your heart rate and make you sweat (moderate-intensity exercise). ? Most adults should also do strengthening exercises at least twice a week. This is in addition to the moderate-intensity exercise.  Maintain a healthy weight  Body mass index  (BMI) is a measurement that can be used to identify possible weight problems. It estimates body fat based on height and weight. Your health care provider can help determine your BMI and help you achieve or maintain a healthy weight.  For females 62 years of age and older: ? A BMI below 18.5 is considered underweight. ? A BMI of 18.5 to 24.9 is normal. ? A BMI of 25 to 29.9 is considered overweight. ? A BMI of 30 and above is considered obese.  Watch levels of cholesterol and blood lipids  You should start having your blood tested for lipids and cholesterol at 63 years of age, then have this test every 5 years.  You may need to have your cholesterol levels checked more often if: ? Your lipid or cholesterol levels are high. ? You are older than 63 years of age. ? You are at high risk for heart disease.  Cancer screening Lung Cancer  Lung cancer screening is recommended for adults 64-81 years old who are at high risk for lung cancer because of a history of smoking.  A yearly low-dose CT scan of the lungs is recommended for people who: ? Currently smoke. ? Have quit within the past 15 years. ? Have at least a 30-pack-year history of smoking. A pack year is smoking an average of one pack of cigarettes a day for 1 year.  Yearly screening should continue until it has been 15 years since you quit.  Yearly screening should stop if you develop a health problem that would prevent you from having lung cancer  treatment.  Breast Cancer  Practice breast self-awareness. This means understanding how your breasts normally appear and feel.  It also means doing regular breast self-exams. Let your health care provider know about any changes, no matter how small.  If you are in your 20s or 30s, you should have a clinical breast exam (CBE) by a health care provider every 1-3 years as part of a regular health exam.  If you are 69 or older, have a CBE every year. Also consider having a breast X-ray  (mammogram) every year.  If you have a family history of breast cancer, talk to your health care provider about genetic screening.  If you are at high risk for breast cancer, talk to your health care provider about having an MRI and a mammogram every year.  Breast cancer gene (BRCA) assessment is recommended for women who have family members with BRCA-related cancers. BRCA-related cancers include: ? Breast. ? Ovarian. ? Tubal. ? Peritoneal cancers.  Results of the assessment will determine the need for genetic counseling and BRCA1 and BRCA2 testing.  Cervical Cancer Your health care provider may recommend that you be screened regularly for cancer of the pelvic organs (ovaries, uterus, and vagina). This screening involves a pelvic examination, including checking for microscopic changes to the surface of your cervix (Pap test). You may be encouraged to have this screening done every 3 years, beginning at age 19.  For women ages 107-65, health care providers may recommend pelvic exams and Pap testing every 3 years, or they may recommend the Pap and pelvic exam, combined with testing for human papilloma virus (HPV), every 5 years. Some types of HPV increase your risk of cervical cancer. Testing for HPV may also be done on women of any age with unclear Pap test results.  Other health care providers may not recommend any screening for nonpregnant women who are considered low risk for pelvic cancer and who do not have symptoms. Ask your health care provider if a screening pelvic exam is right for you.  If you have had past treatment for cervical cancer or a condition that could lead to cancer, you need Pap tests and screening for cancer for at least 20 years after your treatment. If Pap tests have been discontinued, your risk factors (such as having a new sexual partner) need to be reassessed to determine if screening should resume. Some women have medical problems that increase the chance of getting  cervical cancer. In these cases, your health care provider may recommend more frequent screening and Pap tests.  Colorectal Cancer  This type of cancer can be detected and often prevented.  Routine colorectal cancer screening usually begins at 63 years of age and continues through 63 years of age.  Your health care provider may recommend screening at an earlier age if you have risk factors for colon cancer.  Your health care provider may also recommend using home test kits to check for hidden blood in the stool.  A small camera at the end of a tube can be used to examine your colon directly (sigmoidoscopy or colonoscopy). This is done to check for the earliest forms of colorectal cancer.  Routine screening usually begins at age 41.  Direct examination of the colon should be repeated every 5-10 years through 63 years of age. However, you may need to be screened more often if early forms of precancerous polyps or small growths are found.  Skin Cancer  Check your skin from head to toe regularly.  Tell your health care provider about any new moles or changes in moles, especially if there is a change in a mole's shape or color.  Also tell your health care provider if you have a mole that is larger than the size of a pencil eraser.  Always use sunscreen. Apply sunscreen liberally and repeatedly throughout the day.  Protect yourself by wearing long sleeves, pants, a wide-brimmed hat, and sunglasses whenever you are outside.  Heart disease, diabetes, and high blood pressure  High blood pressure causes heart disease and increases the risk of stroke. High blood pressure is more likely to develop in: ? People who have blood pressure in the high end of the normal range (130-139/85-89 mm Hg). ? People who are overweight or obese. ? People who are African American.  If you are 50-86 years of age, have your blood pressure checked every 3-5 years. If you are 74 years of age or older, have your  blood pressure checked every year. You should have your blood pressure measured twice-once when you are at a hospital or clinic, and once when you are not at a hospital or clinic. Record the average of the two measurements. To check your blood pressure when you are not at a hospital or clinic, you can use: ? An automated blood pressure machine at a pharmacy. ? A home blood pressure monitor.  If you are between 66 years and 51 years old, ask your health care provider if you should take aspirin to prevent strokes.  Have regular diabetes screenings. This involves taking a blood sample to check your fasting blood sugar level. ? If you are at a normal weight and have a low risk for diabetes, have this test once every three years after 63 years of age. ? If you are overweight and have a high risk for diabetes, consider being tested at a younger age or more often. Preventing infection Hepatitis B  If you have a higher risk for hepatitis B, you should be screened for this virus. You are considered at high risk for hepatitis B if: ? You were born in a country where hepatitis B is common. Ask your health care provider which countries are considered high risk. ? Your parents were born in a high-risk country, and you have not been immunized against hepatitis B (hepatitis B vaccine). ? You have HIV or AIDS. ? You use needles to inject street drugs. ? You live with someone who has hepatitis B. ? You have had sex with someone who has hepatitis B. ? You get hemodialysis treatment. ? You take certain medicines for conditions, including cancer, organ transplantation, and autoimmune conditions.  Hepatitis C  Blood testing is recommended for: ? Everyone born from [birth year] through 1966. ? Anyone with known risk factors for hepatitis C.  Sexually transmitted infections (STIs)  You should be screened for sexually transmitted infections (STIs) including gonorrhea and chlamydia if: ? You are sexually active and  are younger than 63 years of age. ? You are older than 63 years of age and your health care provider tells you that you are at risk for this type of infection. ? Your sexual activity has changed since you were last screened and you are at an increased risk for chlamydia or gonorrhea. Ask your health care provider if you are at risk.  If you do not have HIV, but are at risk, it may be recommended that you take a prescription medicine daily to prevent HIV infection. This is called  pre-exposure prophylaxis (PrEP). You are considered at risk if: ? You are sexually active and do not regularly use condoms or know the HIV status of your partner(s). ? You take drugs by injection. ? You are sexually active with a partner who has HIV.  Talk with your health care provider about whether you are at high risk of being infected with HIV. If you choose to begin PrEP, you should first be tested for HIV. You should then be tested every 3 months for as long as you are taking PrEP. Pregnancy  If you are premenopausal and you may become pregnant, ask your health care provider about preconception counseling.  If you may become pregnant, take 400 to 800 micrograms (mcg) of folic acid every day.  If you want to prevent pregnancy, talk to your health care provider about birth control (contraception). Osteoporosis and menopause  Osteoporosis is a disease in which the bones lose minerals and strength with aging. This can result in serious bone fractures. Your risk for osteoporosis can be identified using a bone density scan.  If you are 58 years of age or older, or if you are at risk for osteoporosis and fractures, ask your health care provider if you should be screened.  Ask your health care provider whether you should take a calcium or vitamin D supplement to lower your risk for osteoporosis.  Menopause may have certain physical symptoms and risks.  Hormone replacement therapy may reduce some of these symptoms and  risks. Talk to your health care provider about whether hormone replacement therapy is right for you. Follow these instructions at home:  Schedule regular health, dental, and eye exams.  Stay current with your immunizations.  Do not use any tobacco products including cigarettes, chewing tobacco, or electronic cigarettes.  If you are pregnant, do not drink alcohol.  If you are breastfeeding, limit how much and how often you drink alcohol.  Limit alcohol intake to no more than 1 drink per day for nonpregnant women. One drink equals 12 ounces of beer, 5 ounces of wine, or 1 ounces of hard liquor.  Do not use street drugs.  Do not share needles.  Ask your health care provider for help if you need support or information about quitting drugs.  Tell your health care provider if you often feel depressed.  Tell your health care provider if you have ever been abused or do not feel safe at home. This information is not intended to replace advice given to you by your health care provider. Make sure you discuss any questions you have with your health care provider. Document Released: 02/07/2011 Document Revised: 12/31/2015 Document Reviewed: 04/28/2015 Elsevier Interactive Patient Education  [employer].

## 2018-05-14 NOTE — Progress Notes (Signed)
Subjective:    Patient ID: Gabriela Carrillo, female    DOB: 03/08/1956, 63 y.o.   MRN: [id number]  HPI She is here for a physical exam.   We increased her losartan one month ago.  Her bp last week at home was 126/77, 133/85.  This morning it was 127/77 and at work today it was 158/88.  Her husband uses the same blood pressure cuff and it is accurate for him.  She has felt good on the medication and was going to tell me she felt better until her blood pressure reading today.  She denies any side effects from medication.  She has started doing some walking and has lost a couple of pounds.  She has no concerns.  Medications and allergies reviewed with patient and updated if appropriate.  Patient Active Problem List   Diagnosis Date Noted  . S/P lumpectomy of breast 03/19/2018  . C. difficile diarrhea 06/07/2017  . Uncontrolled hypertension 05/03/2016  . 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 Allergic rhinitis 11/11/2015  . Osteopenia 09/09/2013  . Anxiety state 09/04/2009  . Irritable bowel syndrome 09/04/2009  . BREAST CANCER, HX OF 08/07/2008    Current Outpatient Medications on File Prior to Visit  Medication Sig Dispense Refill  . FLUoxetine (PROZAC) 20 MG capsule Take 1 capsule (20 mg total) by mouth daily. 90 capsule 1  . losartan (COZAAR) 100 MG tablet Take 1 tablet (100 mg total) by mouth daily. 90 tablet 3   No current facility-administered medications on file prior to visit.     Past Medical History:  Diagnosis Date  . Allergy to sulfa drugs    itching w/o rash  . Cancer (HCC)    BREAST,PMH OF  . Depression    PMH of  . 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 Headache 05/20/2017   Encouraged increased hydration, 64 ounces of clear fluids daily. Minimize alcohol and caffeine. Eat small frequent meals with lean proteins and complex carbs. Avoid high and low blood sugars. Get adequate sleep, 7-8 hours a night. Needs exercise daily preferably in the morning. This is the  first time she has had migrainous symptoms with N/V but denies photophobia, phonophobia or other neurologic c  . Hypertension    Has been taking Losartan for about a year.  . Hypokalemia    PMH OF    Past Surgical History:  Procedure Laterality Date  . BREAST LUMPECTOMY Left 2004   CHEMOTHERAPY  FOLLOWED BY RADIATION  . BREAST RECONSTRUCTION Left 12/18/2014   Procedure: DELAYED LEFT BREAST RECONSTRUCTION WITH PLACEMENT OF GEL  IMPLANT;  Surgeon: Crissie Reese, MD;  Location: [facility];  Service: Plastics;  Laterality: Left;  . BUNIONECTOMY BILATERALLY    . CESAREAN SECTION    . COLONOSCOPY  2011   negative  . G3 P2    . PLACEMENT OF BREAST IMPLANTS     left    Social History   Socioeconomic History  . Marital status: Married    Spouse name: Not on file  . Number of children: Not on file  . Years of education: Not on file  . Highest education level: Not on file  Occupational History  . Not on file  Social Needs  . Financial resource strain: Not on file  . Food insecurity:    Worry: Not on file    Inability: Not on file  . Transportation needs:    Medical: Not on file    Non-medical: Not  on file  Tobacco Use  . Smoking status: Never Smoker  . Smokeless tobacco: Never Used  Substance and Sexual Activity  . Alcohol use: Yes    Comment:  2-3 per week  . Drug use: No  . Sexual activity: Not on file  Lifestyle  . Physical activity:    Days per week: Not on file    Minutes per session: Not on file  . Stress: Not on file  Relationships  . Social connections:    Talks on phone: Not on file    Gets together: Not on file    Attends religious service: Not on file    Active member of club or organization: Not on file    Attends meetings of clubs or organizations: Not on file    Relationship status: Not on file  Other Topics Concern  . Not on file  Social History Narrative   WALKS DAILY 40-45 MINS    Family History  Problem Relation Age of Onset  . Diabetes Mother   .  Anxiety disorder Mother   . Goiter Mother   . Hypertension Sister        X16  . Arthritis Sister        Rheumatoid   . Cancer Neg Hx   . Heart disease Neg Hx   . Stroke Neg Hx   . Colon cancer Neg Hx     Review of Systems  Constitutional: Negative for chills, fatigue and fever.  Eyes: Negative for visual disturbance.  Respiratory: Negative for cough, shortness of breath and wheezing.   Cardiovascular: Negative for chest pain, palpitations and leg swelling.  Gastrointestinal: Negative for abdominal pain, blood in stool, constipation, diarrhea and nausea.       Rare reflux  Genitourinary: Negative for dysuria and hematuria.  Musculoskeletal: Positive for arthralgias (right knee, right upper arm/shoulder). Negative for back pain and neck pain.  Skin: Negative for color change and rash.  Neurological: Negative for dizziness, light-headedness and headaches.  Psychiatric/Behavioral: Negative for dysphoric mood. The patient is nervous/anxious (controlled).        Objective:   Vitals:   05/15/18 1341 05/15/18 1439  BP: (!) 170/100 (!) 168/98  Pulse: 71   Resp: 16   Temp: 98.4 F (36.9 C)   SpO2: 96%    Filed Weights   05/15/18 1341  Weight: 148 lb (67.1 kg)   Body mass index is 25.4 kg/m.  Wt Readings from Last 3 Encounters:  05/15/18 148 lb (67.1 kg)  04/24/18 150 lb (68 kg)  09/20/17 146 lb (66.2 kg)     Physical Exam Constitutional: She appears well-developed and well-nourished. No distress.  HENT:  Head: Normocephalic and atraumatic.  Right Ear: External ear normal. Normal ear canal and TM Left Ear: External ear normal.  Normal ear canal and TM Mouth/Throat: Oropharynx is clear and moist.  Eyes: Conjunctivae and EOM are normal.  Neck: Neck supple. No tracheal deviation present. No thyromegaly present.  No carotid bruit  Cardiovascular: Normal rate, regular rhythm and normal heart sounds.   No murmur heard.  No edema. Pulmonary/Chest: Effort normal and breath  sounds normal. No respiratory distress. She has no wheezes. She has no rales.  Breast: deferred to Gyn Abdominal: Soft. She exhibits no distension. There is no tenderness.  Lymphadenopathy: She has no cervical adenopathy.  Skin: Skin is warm and dry. She is not diaphoretic.  Psychiatric: She has a normal mood and affect. Her behavior is normal.  Assessment & Plan:   Physical exam: Screening blood work ordered Immunizations   Flu vaccine today, discussed shingrix,  Colonoscopy   Up to date  Mammogram   Up to date  Gyn   Up to date  Dexa  Managed by gyn Eye exams  Up to date  EKG  Done 2016 Exercise continue regular walking Weight   advised to continue weight loss efforts Skin    no concerns Substance abuse   none  See Problem List for Assessment and Plan of chronic medical problems.   Follow-up in 6 months, sooner if blood pressure is not controlled-she will update me via MyChart

## 2018-05-15 ENCOUNTER — Other Ambulatory Visit (INDEPENDENT_AMBULATORY_CARE_PROVIDER_SITE_OTHER): Payer: 59

## 2018-05-15 ENCOUNTER — Ambulatory Visit (INDEPENDENT_AMBULATORY_CARE_PROVIDER_SITE_OTHER): Payer: 59 | Admitting: Internal Medicine

## 2018-05-15 ENCOUNTER — Encounter: Payer: Self-pay | Admitting: Internal Medicine

## 2018-05-15 VITALS — BP 168/98 | HR 71 | Temp 98.4°F | Resp 16 | Ht 64.0 in | Wt 148.0 lb

## 2018-05-15 DIAGNOSIS — I10 Essential (primary) hypertension: Secondary | ICD-10-CM | POA: Diagnosis not present

## 2018-05-15 DIAGNOSIS — F411 Generalized anxiety disorder: Secondary | ICD-10-CM | POA: Diagnosis not present

## 2018-05-15 DIAGNOSIS — M85852 Other specified disorders of bone density and structure, left thigh: Secondary | ICD-10-CM

## 2018-05-15 DIAGNOSIS — Z23 Encounter for immunization: Secondary | ICD-10-CM

## 2018-05-15 DIAGNOSIS — Z0000 Encounter for general adult medical examination without abnormal findings: Secondary | ICD-10-CM | POA: Diagnosis not present

## 2018-05-15 LAB — COMPREHENSIVE METABOLIC PANEL
ALBUMIN: 4.4 g/dL (ref 3.5–5.2)
ALK PHOS: 70 U/L (ref 39–117)
ALT: 28 U/L (ref 0–35)
AST: 27 U/L (ref 0–37)
BILIRUBIN TOTAL: 0.7 mg/dL (ref 0.2–1.2)
BUN: 8 mg/dL (ref 6–23)
CALCIUM: 9.4 mg/dL (ref 8.4–10.5)
CHLORIDE: 103 meq/L (ref 96–112)
CO2: 29 mEq/L (ref 19–32)
CREATININE: 0.68 mg/dL (ref 0.40–1.20)
GFR: 92.81 mL/min (ref >60.00)
Glucose, Bld: 93 mg/dL (ref 70–99)
Potassium: 3.7 mEq/L (ref 3.5–5.1)
SODIUM: 141 meq/L (ref 135–145)
TOTAL PROTEIN: 7.3 g/dL (ref 6.0–8.3)

## 2018-05-15 LAB — CBC WITH DIFFERENTIAL/PLATELET
BASOS ABS: 0 10*3/uL (ref 0.0–0.1)
BASOS PCT: 0.4 % (ref 0.0–3.0)
EOS ABS: 0 10*3/uL (ref 0.0–0.7)
Eosinophils Relative: 0.6 % (ref 0.0–5.0)
HEMATOCRIT: 42.9 % (ref 36.0–46.0)
HEMOGLOBIN: 15.2 g/dL — AB (ref 12.0–15.0)
LYMPHS PCT: 30.3 % (ref 12.0–46.0)
Lymphs Abs: 1.9 10*3/uL (ref 0.7–4.0)
MCHC: 35.4 g/dL (ref 30.0–36.0)
MCV: 89.7 fl (ref 78.0–100.0)
MONO ABS: 0.5 10*3/uL (ref 0.1–1.0)
Monocytes Relative: 7.2 % (ref 3.0–12.0)
Neutro Abs: 3.9 10*3/uL (ref 1.4–7.7)
Neutrophils Relative %: 61.5 % (ref 43.0–77.0)
Platelets: 245 10*3/uL (ref 150.0–400.0)
RBC: 4.78 Mil/uL (ref 3.87–5.11)
RDW: 11.9 % (ref 11.5–15.5)
WBC: 6.3 10*3/uL (ref 4.0–10.5)

## 2018-05-15 LAB — LIPID PANEL
CHOLESTEROL: 171 mg/dL (ref 0–200)
HDL: 56.4 mg/dL (ref >39.00)
LDL CALC: 94 mg/dL (ref 0–99)
NonHDL: 114.77
Total CHOL/HDL Ratio: 3
Triglycerides: 106 mg/dL (ref 0.0–149.0)
VLDL: 21.2 mg/dL (ref 0.0–40.0)

## 2018-05-15 LAB — TSH: TSH: 1.29 u[IU]/mL (ref 0.35–4.50)

## 2018-05-15 MED ORDER — AMLODIPINE BESYLATE 2.5 MG PO TABS: 2.5000 mg | ORAL_TABLET | Freq: Every day | ORAL | 3 refills | Status: DC

## 2018-05-15 NOTE — Assessment & Plan Note (Signed)
Controlled, stable Continue current dose of medication  

## 2018-05-15 NOTE — Assessment & Plan Note (Signed)
Osteopenia managed by GYN DEXA up-to-date Continue regular walking

## 2018-05-15 NOTE — Assessment & Plan Note (Signed)
Blood pressure sounds like it was well controlled until today at home and she was feeling better Blood pressure at work today and here today both very high Continue losartan 100 mg daily Start amlodipine 2.5 mg daily She will continue to monitor at home and update me via my chart CMP Continue regular exercise and low-sodium diet

## 2018-05-16 ENCOUNTER — Encounter: Payer: Self-pay | Admitting: Internal Medicine

## 2018-05-22 ENCOUNTER — Encounter: Payer: Self-pay | Admitting: Internal Medicine

## 2018-06-10 ENCOUNTER — Encounter: Payer: Self-pay | Admitting: Internal Medicine

## 2018-06-11 MED ORDER — AMLODIPINE BESYLATE 2.5 MG PO TABS: 2.5000 mg | ORAL_TABLET | Freq: Every day | ORAL | 3 refills | Status: DC

## 2018-07-03 ENCOUNTER — Encounter

## 2018-07-03 NOTE — Telephone Encounter (Signed)
Patient notified. Appointment made.

## 2018-07-03 NOTE — Telephone Encounter (Signed)
Patient is due for an appointment.

## 2018-07-03 NOTE — Telephone Encounter (Signed)
Tried contacting patient. VM is not set up.

## 2018-07-08 ENCOUNTER — Encounter

## 2018-07-09 NOTE — Telephone Encounter (Signed)
Patient last seen on 01/02/18. Advised to follow up 6 months. Next appointment 07/12/18.

## 2018-07-12 ENCOUNTER — Ambulatory Visit: Admit: 2018-07-12 | Discharge: 2018-07-12 | Payer: PRIVATE HEALTH INSURANCE | Attending: Family Medicine

## 2018-07-12 DIAGNOSIS — I10 Essential (primary) hypertension: Secondary | ICD-10-CM

## 2018-07-12 MED ORDER — HYDROXYZINE HCL 50 MG PO TABS
50 MG | ORAL_TABLET | Freq: Every evening | ORAL | 1 refills | Status: DC
Start: 2018-07-12 — End: 2019-01-04

## 2018-07-12 MED ORDER — LISINOPRIL 5 MG PO TABS
5 MG | ORAL_TABLET | ORAL | 1 refills | Status: DC
Start: 2018-07-12 — End: 2019-01-08

## 2018-07-12 MED ORDER — ATORVASTATIN CALCIUM 20 MG PO TABS
20 MG | ORAL_TABLET | Freq: Every day | ORAL | 1 refills | Status: AC
Start: 2018-07-12 — End: ?

## 2018-07-12 MED ORDER — AMLODIPINE BESYLATE 10 MG PO TABS
10 MG | ORAL_TABLET | ORAL | 1 refills | Status: DC
Start: 2018-07-12 — End: 2018-12-21

## 2018-07-12 MED ORDER — CYCLOBENZAPRINE HCL 10 MG PO TABS
10 MG | ORAL_TABLET | ORAL | 1 refills | Status: DC
Start: 2018-07-12 — End: 2019-01-04

## 2018-07-12 MED ORDER — ZOLPIDEM TARTRATE 5 MG PO TABS
5 MG | ORAL_TABLET | Freq: Every evening | ORAL | 1 refills | Status: DC | PRN
Start: 2018-07-12 — End: 2018-10-25

## 2018-07-12 NOTE — Progress Notes (Signed)
Subjective:      Patient ID: Cassandra Copeland is a [age] y.o. female.    HPI  Chief Complaint   Patient presents with   ??? Hyperlipidemia-no problem with compliance;no myalgias;    ??? Hypertension-Denies cv/cns/claud sx    ??? Insomnia-  Controlled Substance Monitoring:    Acute and Chronic Pain Monitoring:   RX Monitoring 07/12/2018   Attestation -   Periodic Controlled Substance Monitoring Possible medication side effects, risk of tolerance/dependence & alternative treatments discussed.;No signs of potential drug abuse or diversion identified.;Assessed functional status.   Chronic Pain > 50 MEDD -   Chronic Pain > 80 MEDD -         ??? Anxiety     stopped the doxepin   ??? Chronic Pain-takes meloxicam routinely.  Also takes medicine for heartburn intermittently.  Takes meloxicam without food.  Did suggest she might start taking it with food     Chief complaint present illness: 62 year old white female presents unaccompanied for routine follow-up    Review of Systems  background/entire past medical,social and family history obtained and reviewed/updated today   Objective:   Physical Exam  Vitals signs and nursing note reviewed.   Constitutional:       Appearance: She is obese.   Neck:      Musculoskeletal: Neck supple.      Vascular: No carotid bruit.   Cardiovascular:      Rate and Rhythm: Normal rate and regular rhythm.      Heart sounds: Normal heart sounds. No murmur.   Pulmonary:      Effort: Pulmonary effort is normal.      Breath sounds: Normal breath sounds. No wheezing, rhonchi or rales.   Skin:     General: Skin is warm.   Neurological:      Mental Status: She is alert.      BP (!) 140/90 Comment: after 5 in quiet room   Pulse 65    Wt 201 lb (91.2 kg)    SpO2 99%    BMI 35.61 kg/m??       Assessment:      Cassandra Copeland was seen today for hyperlipidemia, hypertension, insomnia, anxiety and chronic pain.    Diagnoses and all orders for this visit:    Essential hypertension  -     amLODIPine (NORVASC) 10 MG tablet; TAKE 1 TABLET  DAILY  -     lisinopril (PRINIVIL;ZESTRIL) 5 MG tablet; TAKE 1 TABLET DAILY    Fibromyalgia  -     cyclobenzaprine (FLEXERIL) 10 MG tablet; TAKE 1 TABLET NIGHTLY  -     hydrOXYzine (ATARAX) 50 MG tablet; Take 1 tablet by mouth nightly    Depression, unspecified depression type    Primary insomnia  -     zolpidem (AMBIEN) 5 MG tablet; Take 1 tablet by mouth nightly as needed for Sleep for up to 20 days.    Mild intermittent asthma without complication    Other orders  -     atorvastatin (LIPITOR) 20 MG tablet; Take 1 tablet by mouth daily           Plan:      Return in about 6 months (around 01/11/2019).  There are no Patient Instructions on file for this visit.         Franciso BendJacqualine R Boling, MA

## 2018-08-11 ENCOUNTER — Encounter

## 2018-08-13 MED ORDER — MELOXICAM 15 MG PO TABS
15 MG | ORAL_TABLET | ORAL | 1 refills | Status: AC
Start: 2018-08-13 — End: ?

## 2018-08-13 NOTE — Telephone Encounter (Signed)
Patient last seen on 07/12/18. Advised to follow up 6 months. Next appointment none.

## 2018-08-28 ENCOUNTER — Other Ambulatory Visit: Payer: Self-pay | Admitting: Orthopedic Surgery

## 2018-08-28 DIAGNOSIS — M25511 Pain in right shoulder: Secondary | ICD-10-CM

## 2018-09-04 ENCOUNTER — Ambulatory Visit
Admission: RE | Admit: 2018-09-04 | Discharge: 2018-09-04 | Disposition: A | Discharge status: Home / Self Care | Payer: Worker's Compensation | Source: Ambulatory Visit | Attending: Orthopedic Surgery | Admitting: Orthopedic Surgery

## 2018-09-04 DIAGNOSIS — M25511 Pain in right shoulder: Secondary | ICD-10-CM

## 2018-09-20 ENCOUNTER — Encounter

## 2018-09-21 NOTE — Telephone Encounter (Signed)
Future appt scheduled 0 scheduled   Return in about 6 months (around 01/11/2019).    Last appt 07/12/2018      Last Written     amLODIPine (NORVASC) 10 MG tablet  07/12/2018  #90  1 RF       citalopram (CELEXA) 40 MG tablet  01/05/2018  #90  1 RF

## 2018-10-25 ENCOUNTER — Encounter

## 2018-10-25 MED ORDER — ZOLPIDEM TARTRATE 5 MG PO TABS
5 MG | ORAL_TABLET | Freq: Every evening | ORAL | 1 refills | Status: AC | PRN
Start: 2018-10-25 — End: 2018-11-14

## 2018-10-25 NOTE — Telephone Encounter (Signed)
Patient needs a refill on her Remus Loffler she has enough refills on her other meds till she sees her other dr, but she does not have refills on the [place]

## 2018-11-18 ENCOUNTER — Other Ambulatory Visit: Payer: Self-pay | Admitting: Internal Medicine

## 2018-11-19 ENCOUNTER — Ambulatory Visit: Payer: 59 | Admitting: Internal Medicine

## 2018-12-21 ENCOUNTER — Encounter

## 2018-12-21 MED ORDER — AMLODIPINE BESYLATE 10 MG PO TABS
10 MG | ORAL_TABLET | ORAL | 0 refills | Status: AC
Start: 2018-12-21 — End: ?

## 2018-12-21 NOTE — Telephone Encounter (Signed)
Patient last seen on 07/12/18. Advised to follow up 6 months. Next appointment none.

## 2019-01-03 ENCOUNTER — Encounter

## 2019-01-04 MED ORDER — CYCLOBENZAPRINE HCL 10 MG PO TABS
10 MG | ORAL_TABLET | ORAL | 0 refills | Status: AC
Start: 2019-01-04 — End: ?

## 2019-01-04 MED ORDER — HYDROXYZINE HCL 50 MG PO TABS
50 MG | ORAL_TABLET | ORAL | 0 refills | Status: AC
Start: 2019-01-04 — End: ?

## 2019-01-04 NOTE — Telephone Encounter (Signed)
Patient last seen on??07/12/18. Advised to follow up??6 months. Next appointment??none.??

## 2019-01-08 ENCOUNTER — Encounter

## 2019-01-08 MED ORDER — LISINOPRIL 5 MG PO TABS
5 MG | ORAL_TABLET | ORAL | 0 refills | Status: AC
Start: 2019-01-08 — End: ?

## 2019-01-08 NOTE — Telephone Encounter (Signed)
Patient last seen on??07/12/18. Advised to follow up??6 months. Next appointment??none.??

## 2019-01-21 NOTE — Progress Notes (Signed)
Virtual Visit via Video Note  I connected with Gabriela Carrillo on 01/22/19 at  1:45 PM EDT by a video enabled telemedicine application and verified that I am speaking with the correct person using two identifiers.   I discussed the limitations of evaluation and management by telemedicine and the availability of in person appointments. The patient expressed understanding and agreed to proceed.  The patient is currently at home and I am in the office.    No referring provider.    History of Present Illness: She is here for follow up of her chronic medical conditions.   She is exercising irregularly, doing shoulder PT, and yardwork.    Hypertension: She is taking her medication daily. She is compliant with a low sodium diet.  She denies chest pain, palpitations, edema, shortness of breath and regular headaches. She does monitor her blood pressure at home.    Anxiety: She is taking her medication daily as prescribed. She denies any side effects from the medication. She feels her anxiety is well controlled and she is happy with her current dose of medication.   Surgery on march 13th - shoulder arthroscopy.  She did 5 weeks of PT.     She went to [facility] for one week early this year and had a fever, cough and felt bad.    Review of Systems  Constitutional: Negative for chills and fever.  Respiratory: Negative for cough, shortness of breath and wheezing.   Cardiovascular: Negative for chest pain, palpitations and leg swelling.  Neurological: Negative for dizziness and headaches.     Social History   Socioeconomic History  . Marital status: Married    Spouse name: Not on file  . Number of children: Not on file  . Years of education: Not on file  . Highest education level: Not on file  Occupational History  . Not on file  Social Needs  . Financial resource strain: Not on file  . Food insecurity    Worry: Not on file    Inability: Not on file  . Transportation needs    Medical:  Not on file    Non-medical: Not on file  Tobacco Use  . Smoking status: Never Smoker  . Smokeless tobacco: Never Used  Substance and Sexual Activity  . Alcohol use: Yes    Comment:  2-3 per week  . Drug use: No  . Sexual activity: Not on file  Lifestyle  . Physical activity    Days per week: Not on file    Minutes per session: Not on file  . Stress: Not on file  Relationships  . Social Herbalist on phone: Not on file    Gets together: Not on file    Attends religious service: Not on file    Active member of club or organization: Not on file    Attends meetings of clubs or organizations: Not on file    Relationship status: Not on file  Other Topics Concern  . Not on file  Social History Narrative   WALKS DAILY 40-45 MINS     Observations/Objective: Appears well in NAD  BP at home - average 133/80  Assessment and Plan:  See Problem List for Assessment and Plan of chronic medical problems.   Follow Up Instructions:    I discussed the assessment and treatment plan with the patient. The patient was provided an opportunity to ask questions and all were answered. The patient agreed with the plan and demonstrated  an understanding of the instructions.   The patient was advised to call back or seek an in-person evaluation if the symptoms worsen or if the condition fails to improve as anticipated.  FU in 6 months  Binnie Rail, MD

## 2019-01-22 ENCOUNTER — Encounter: Payer: Self-pay | Admitting: Internal Medicine

## 2019-01-22 ENCOUNTER — Ambulatory Visit (INDEPENDENT_AMBULATORY_CARE_PROVIDER_SITE_OTHER): Payer: Self-pay | Admitting: Internal Medicine

## 2019-01-22 DIAGNOSIS — I10 Essential (primary) hypertension: Secondary | ICD-10-CM

## 2019-01-22 DIAGNOSIS — F411 Generalized anxiety disorder: Secondary | ICD-10-CM

## 2019-01-22 NOTE — Assessment & Plan Note (Signed)
Controlled, stable Continue current dose of medication  

## 2019-01-22 NOTE — Assessment & Plan Note (Signed)
BP controlled at home Current regimen effective and well tolerated Continue current medications at current doses

## 2019-01-27 IMAGING — MG 2D DIGITAL SCREENING BILATERAL MAMMOGRAM WITH 3D TOMO WITH CAD
9 of 14 series · 9 of 30 positions shown · non-contrast
Comparison: Previous exam(s).

CLINICAL DATA: Screening.

EXAM:
2D DIGITAL SCREENING BILATERAL MAMMOGRAM WITH 3D TOMO WITH CAD
The patient has left implant. Standard and implant displaced views
were performed.

[L MLO (1 of 2)]
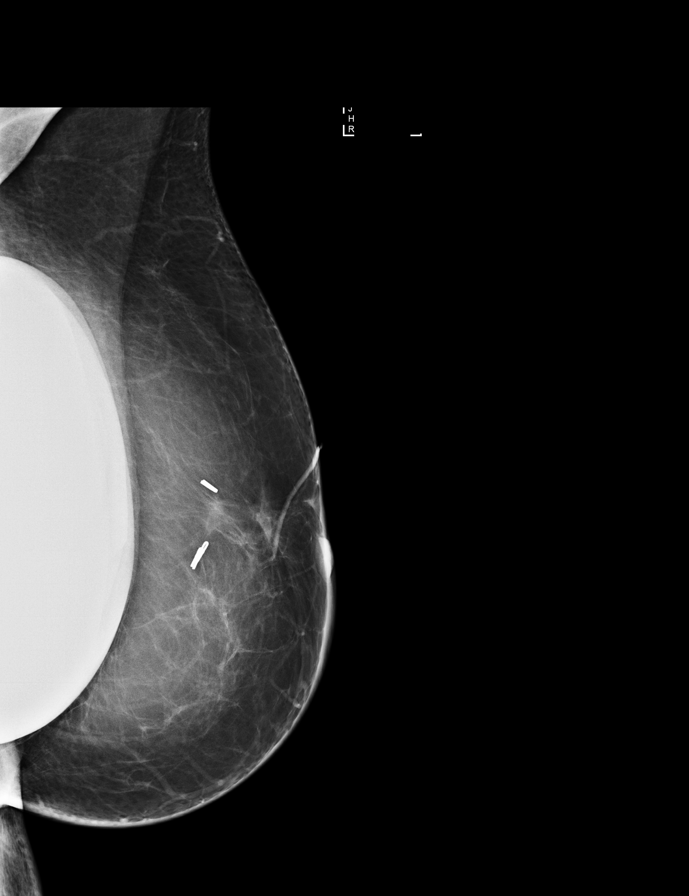

[L CC (1 of 2)]
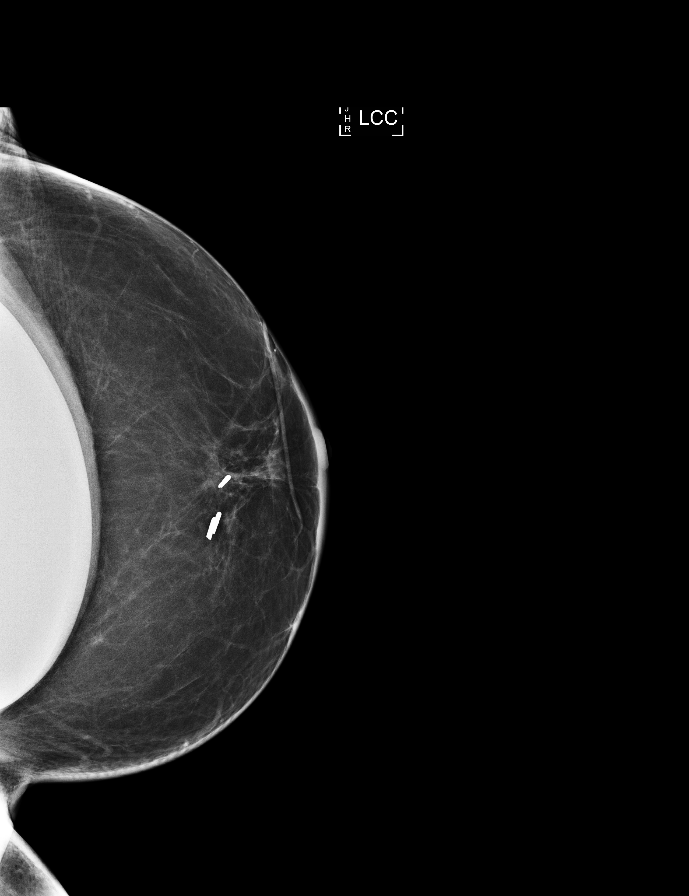

[L MLO synth-2D]
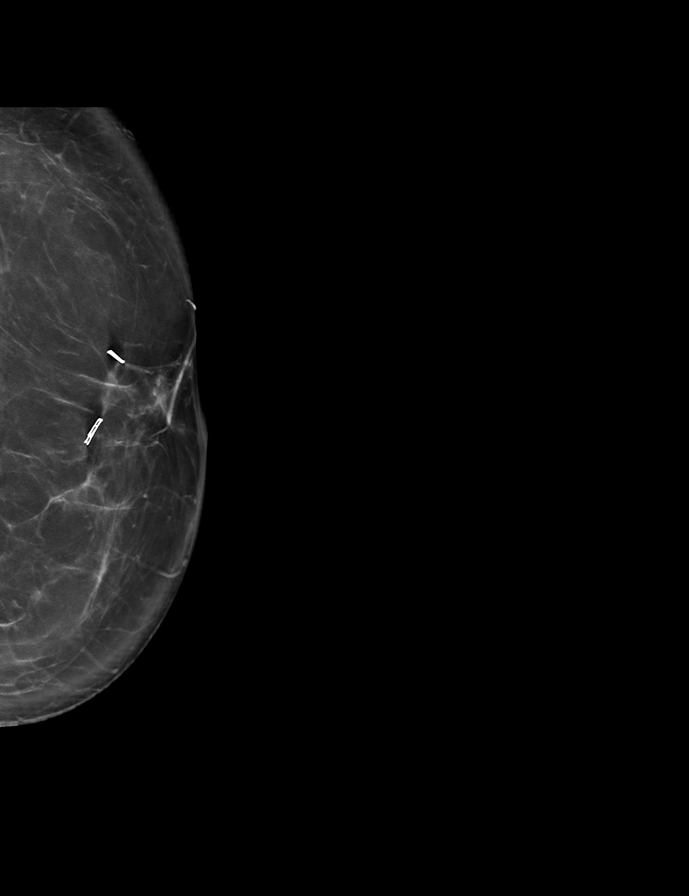

[R CC]
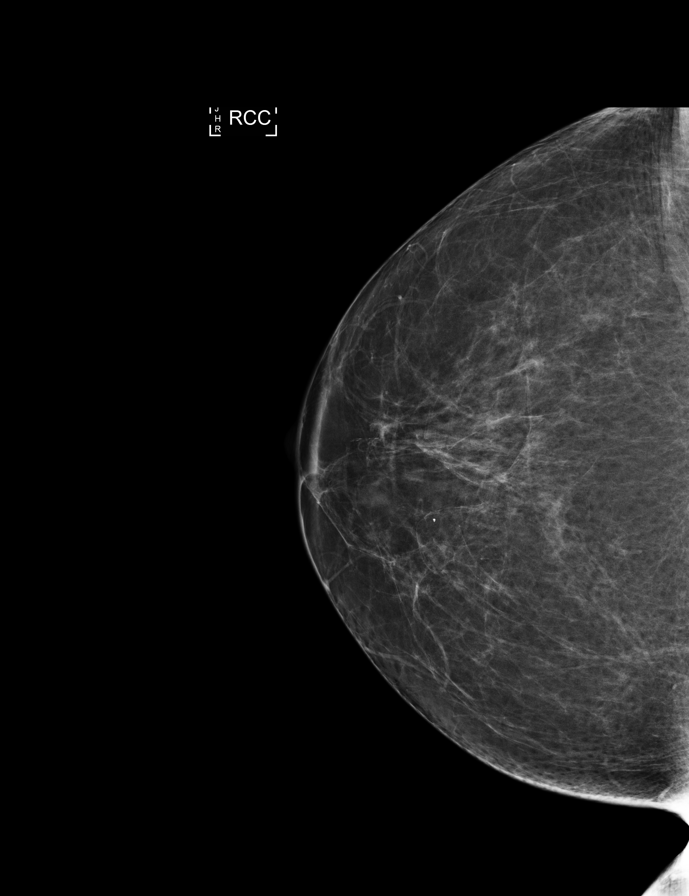

[L CC synth-2D]
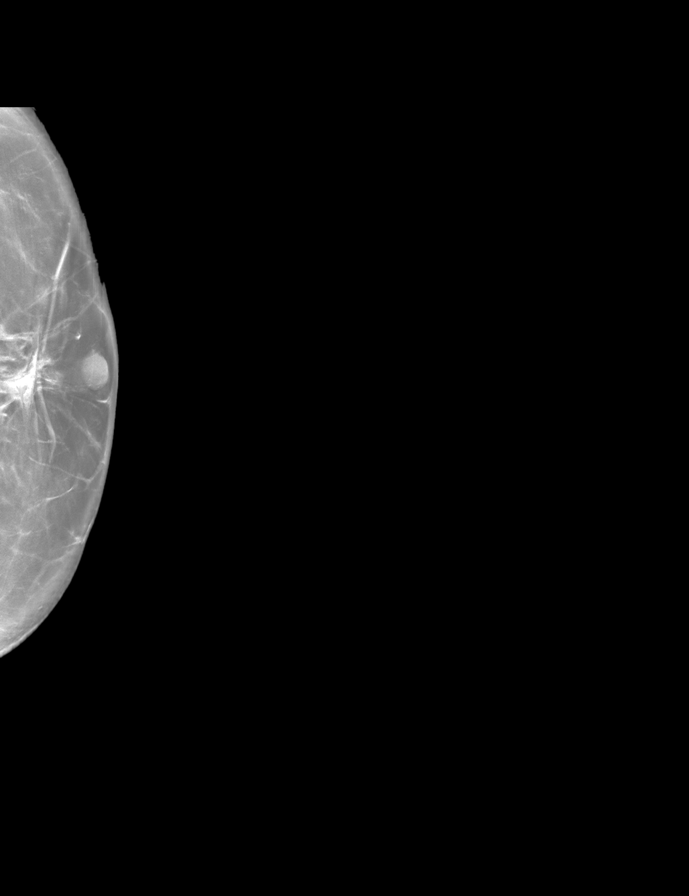

[L CC (2 of 2)]
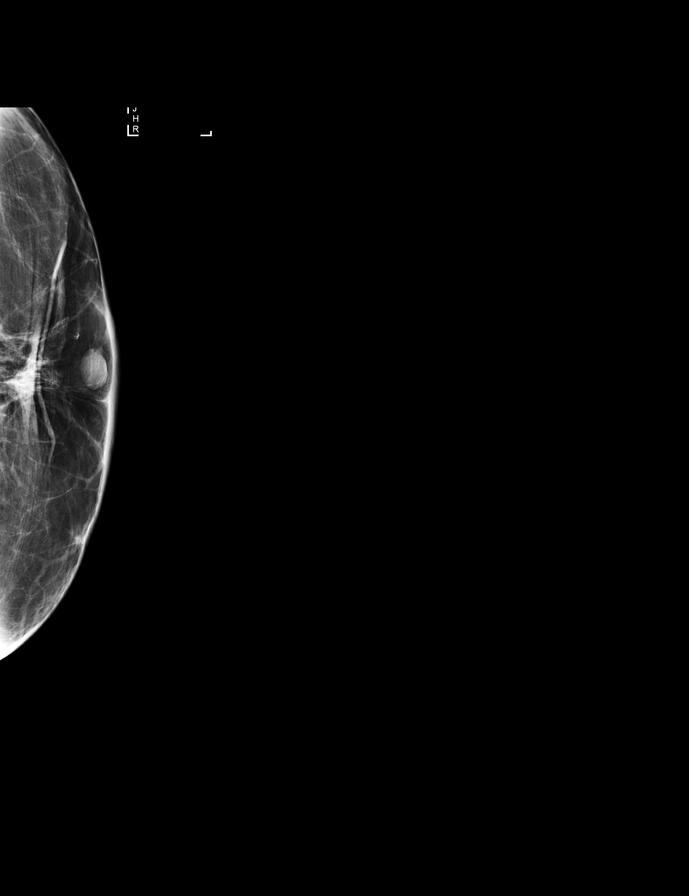

[L MLO (2 of 2)]
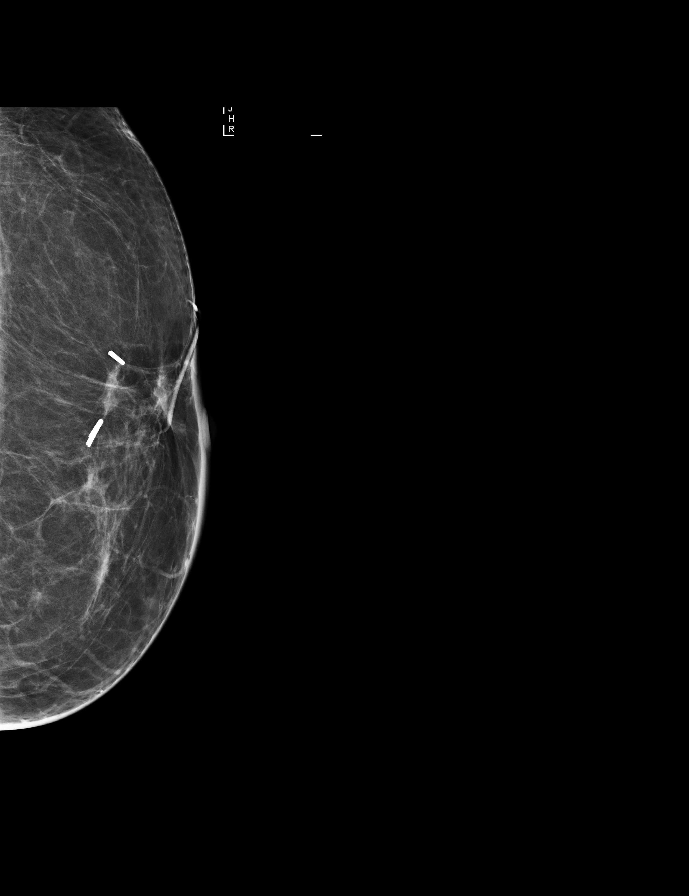

[R MLO synth-2D]
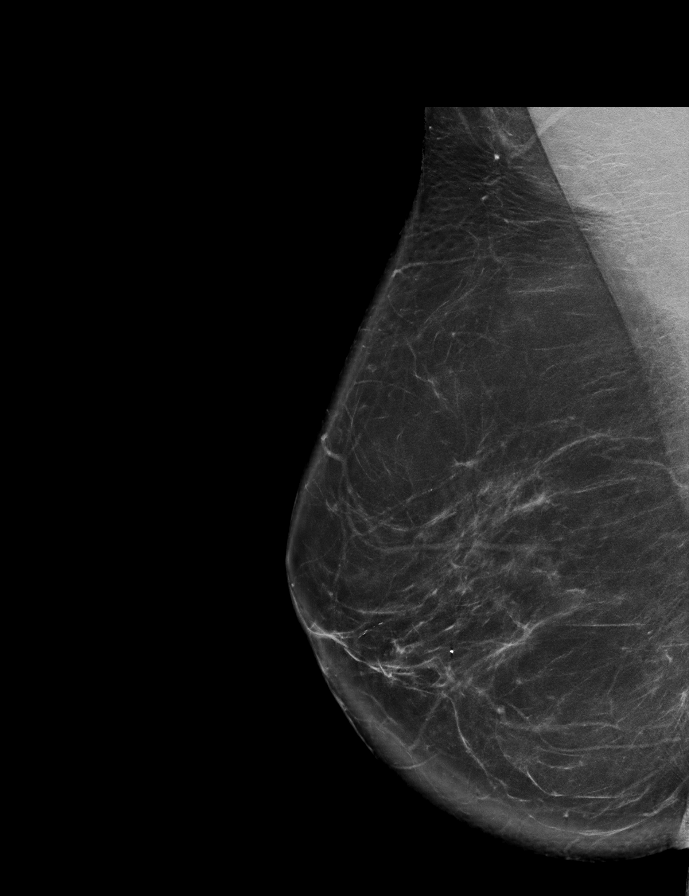

[R MLO]
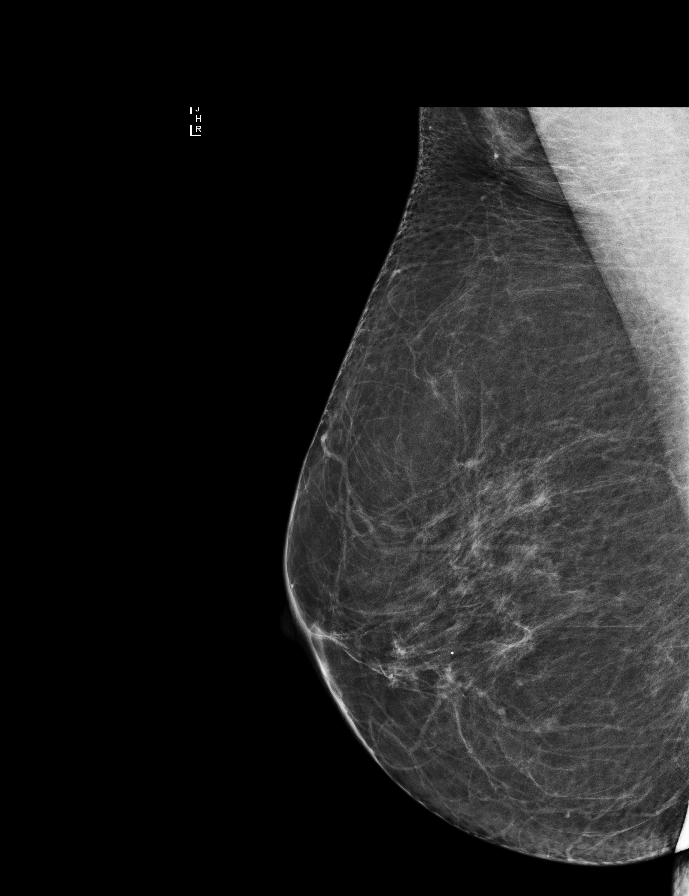

[9 of 30 positions shown; findings below may reference images not displayed]

ACR Breast Density Category b: There are scattered areas of
fibroglandular density.
FINDINGS: There are no findings suspicious for malignancy. Images were
processed with CAD.
IMPRESSION: No mammographic evidence of malignancy. A result letter of this
screening mammogram will be mailed directly to the patient.

RECOMMENDATION:
Screening mammogram in one year. (Code:SS-2-63O)

BI-RADS CATEGORY  1: Negative.

## 2019-02-04 NOTE — Telephone Encounter (Signed)
Needs lipid panel first.  Also, should see her new primary care provider within the next 2 months.  Once I see the blood work, I will refill the atorvastatin

## 2019-02-04 NOTE — Telephone Encounter (Signed)
Patient has a new dr so [facility] sent this to the wrong dr

## 2019-02-07 NOTE — Telephone Encounter (Signed)
[place], MA         ?? 2:09 PM   Note      Patient has a new dr so [facility] sent this to the wrong dr

## 2019-02-08 ENCOUNTER — Encounter

## 2019-02-09 ENCOUNTER — Encounter

## 2019-02-21 ENCOUNTER — Other Ambulatory Visit: Payer: Self-pay | Admitting: Internal Medicine

## 2019-03-21 ENCOUNTER — Encounter

## 2019-03-21 NOTE — Telephone Encounter (Signed)
Patient has a new dr so [facility] sent this to the wrong dr

## 2019-04-08 ENCOUNTER — Encounter

## 2019-04-08 NOTE — Telephone Encounter (Signed)
She has a new pcp it is Dr Corrinne Eagle

## 2019-04-15 ENCOUNTER — Other Ambulatory Visit: Payer: Self-pay | Admitting: Internal Medicine

## 2019-05-24 ENCOUNTER — Other Ambulatory Visit: Payer: Self-pay | Admitting: Internal Medicine

## 2019-05-27 ENCOUNTER — Other Ambulatory Visit: Payer: Self-pay | Admitting: Internal Medicine

## 2019-07-22 ENCOUNTER — Other Ambulatory Visit: Payer: Self-pay | Admitting: Internal Medicine

## 2019-07-23 ENCOUNTER — Encounter: Payer: Self-pay | Admitting: Internal Medicine

## 2019-08-14 ENCOUNTER — Other Ambulatory Visit: Payer: Self-pay | Admitting: Internal Medicine

## 2019-09-01 ENCOUNTER — Other Ambulatory Visit: Payer: Self-pay | Admitting: Internal Medicine

## 2019-09-02 ENCOUNTER — Other Ambulatory Visit: Payer: Self-pay | Admitting: Internal Medicine

## 2019-09-02 DIAGNOSIS — Z1231 Encounter for screening mammogram for malignant neoplasm of breast: Secondary | ICD-10-CM

## 2019-09-02 NOTE — Telephone Encounter (Signed)
      1. Which medications need to be refilled? (please list name of each medication and dose if known) FLUoxetine (PROZAC) 20 MG capsule  2. Which pharmacy/location (including street and city if local pharmacy) is medication to be sent to? [facility], [place] - [address]  3. Do they need a 30 day or 90 day supply? [place]

## 2019-09-09 ENCOUNTER — Other Ambulatory Visit: Payer: Self-pay | Admitting: Internal Medicine

## 2019-09-10 ENCOUNTER — Encounter: Payer: Self-pay | Admitting: Internal Medicine

## 2019-09-10 NOTE — Progress Notes (Signed)
Subjective:    Patient ID: Gabriela Carrillo, female    DOB: 11/30/1955, 64 y.o.   MRN: [id number]  HPI She is here for a physical exam.    Her BP at home has been good - average 131/81 at home.  Her cuff is the same as here - she did bring it with her.   She denies changes in her health and has no major concerns.  Medications and allergies reviewed with patient and updated if appropriate.  Patient Active Problem List   Diagnosis Date Noted  . Osteoarthritis of right knee 09/11/2019  . S/P lumpectomy of breast 03/19/2018  . C. difficile diarrhea 06/07/2017  . Hypertension 05/03/2016  . 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 Allergic rhinitis 11/11/2015  . Osteopenia 09/09/2013  . Anxiety state 09/04/2009  . Irritable bowel syndrome 09/04/2009  . BREAST CANCER, HX OF 08/07/2008    Current Outpatient Medications on File Prior to Visit  Medication Sig Dispense Refill  . amLODipine (NORVASC) 2.5 MG tablet TAKE 1 TABLET BY MOUTH EVERY DAY 90 tablet 1  . FLUoxetine (PROZAC) 20 MG capsule TAKE 1 CAPSULE BY MOUTH EVERY DAY 90 capsule 2  . losartan (COZAAR) 100 MG tablet TAKE 1 TABLET BY MOUTH EVERY DAY 30 tablet 0  . meloxicam (MOBIC) 15 MG tablet Take 15 mg by mouth every morning.     No current facility-administered medications on file prior to visit.    Past Medical History:  Diagnosis Date  . Allergy to sulfa drugs    itching w/o rash  . Cancer (HCC)    BREAST,PMH OF  . Depression    PMH of  . 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 Headache 05/20/2017   Encouraged increased hydration, 64 ounces of clear fluids daily. Minimize alcohol and caffeine. Eat small frequent meals with lean proteins and complex carbs. Avoid high and low blood sugars. Get adequate sleep, 7-8 hours a night. Needs exercise daily preferably in the morning. This is the first time she has had migrainous symptoms with N/V but denies photophobia, phonophobia or other neurologic c  . Hypertension    Has  been taking Losartan for about a year.  . Hypokalemia    PMH OF    Past Surgical History:  Procedure Laterality Date  . BREAST LUMPECTOMY Left 2004   CHEMOTHERAPY  FOLLOWED BY RADIATION  . BREAST RECONSTRUCTION Left 12/18/2014   Procedure: DELAYED LEFT BREAST RECONSTRUCTION WITH PLACEMENT OF GEL  IMPLANT;  Surgeon: Crissie Reese, MD;  Location: [place];  Service: Plastics;  Laterality: Left;  . BUNIONECTOMY BILATERALLY    . CESAREAN SECTION    . COLONOSCOPY  2011   negative  . G3 P2    . PLACEMENT OF BREAST IMPLANTS     left    Social History   Socioeconomic History  . Marital status: Married    Spouse name: Not on file  . Number of children: Not on file  . Years of education: Not on file  . Highest education level: Not on file  Occupational History  . Not on file  Tobacco Use  . Smoking status: Never Smoker  . Smokeless tobacco: Never Used  Substance and Sexual Activity  . Alcohol use: Yes    Comment:  2-3 per week  . Drug use: No  . Sexual activity: Not on file  Other Topics Concern  . Not on file  Social History Narrative   WALKS DAILY 40-45 MINS  Social Determinants of Health   Financial Resource Strain:   . Difficulty of Paying Living Expenses: Not on file  Food Insecurity:   . Worried About Charity fundraiser in the Last Year: Not on file  . Ran Out of Food in the Last Year: Not on file  Transportation Needs:   . Lack of Transportation (Medical): Not on file  . Lack of Transportation (Non-Medical): Not on file  Physical Activity:   . Days of Exercise per Week: Not on file  . Minutes of Exercise per Session: Not on file  Stress:   . Feeling of Stress : Not on file  Social Connections:   . Frequency of Communication with Friends and Family: Not on file  . Frequency of Social Gatherings with Friends and Family: Not on file  . Attends Religious Services: Not on file  . Active Member of Clubs or Organizations: Not on file  . Attends Archivist  Meetings: Not on file  . Marital Status: Not on file    Family History  Problem Relation Age of Onset  . Diabetes Mother   . Anxiety disorder Mother   . Goiter Mother   . Hypertension Sister        [id number]  . Arthritis Sister        Rheumatoid   . Cancer Neg Hx   . Heart disease Neg Hx   . Stroke Neg Hx   . Colon cancer Neg Hx     Review of Systems  Constitutional: Negative for chills and fever.  Eyes: Negative for visual disturbance.  Respiratory: Negative for cough, shortness of breath and wheezing.   Cardiovascular: Negative for chest pain, palpitations and leg swelling.  Gastrointestinal: Negative for abdominal pain, blood in stool, constipation, diarrhea and nausea.       No gerd  Genitourinary: Negative for dysuria and hematuria.  Musculoskeletal: Positive for arthralgias (right knee, fingers). Negative for back pain.  Skin: Negative for color change and rash.  Neurological: Negative for light-headedness and headaches.  Psychiatric/Behavioral: Negative for dysphoric mood. The patient is nervous/anxious (controlled).        Objective:   Vitals:   09/11/19 0831  BP: (!) 154/96  Pulse: 87  Resp: 16  Temp: 98.7 F (37.1 C)  SpO2: 99%   Filed Weights   09/11/19 0831  Weight: 149 lb 12.8 oz (67.9 kg)   Body mass index is 25.71 kg/m.  BP Readings from Last 3 Encounters:  09/11/19 (!) 154/96  05/15/18 (!) 168/98  04/24/18 (!) 152/90    Wt Readings from Last 3 Encounters:  09/11/19 149 lb 12.8 oz (67.9 kg)  05/15/18 148 lb (67.1 kg)  04/24/18 150 lb (68 kg)     Physical Exam Constitutional: She appears well-developed and well-nourished. No distress.  HENT:  Head: Normocephalic and atraumatic.  Right Ear: External ear normal. Normal ear canal and TM Left Ear: External ear normal.  Normal ear canal and TM Mouth/Throat: Oropharynx is clear and moist.  Eyes: Conjunctivae and EOM are normal.  Neck: Neck supple. No tracheal deviation present. No thyromegaly  present.  No carotid bruit  Cardiovascular: Normal rate, regular rhythm and normal heart sounds.   No murmur heard.  No edema. Pulmonary/Chest: Effort normal and breath sounds normal. No respiratory distress. She has no wheezes. She has no rales.  Breast: deferred   Abdominal: Soft. She exhibits no distension. There is no tenderness.  Lymphadenopathy: She has no cervical adenopathy.  Skin: Skin is  warm and dry. She is not diaphoretic.  Psychiatric: She has a normal mood and affect. Her behavior is normal.        Assessment & Plan:   Physical exam: Screening blood work    ordered Immunizations  Discussed shingrix, others up to date Colonoscopy   Up to date  Mammogram   scheduled Gyn  Up to date  Dexa  Done by gyn Eye exams  Up to date  Exercise   Walking  Weight    BMI is good Substance abuse  none Sees derm annually   See Problem List for Assessment and Plan of chronic medical problems.      This visit occurred during the SARS-CoV-2 public health emergency.  Safety protocols were in place, including screening questions prior to the visit, additional usage of staff PPE, and extensive cleaning of exam room while observing appropriate contact time as indicated for disinfecting solutions.

## 2019-09-10 NOTE — Patient Instructions (Addendum)
Blood work was ordered.    All other Health Maintenance issues reviewed.   All recommended immunizations and age-appropriate screenings are up-to-date or discussed.  No immunization administered today.   Medications reviewed and updated.  Changes include :   none     Please followup in 1 year    Health Maintenance, Female Adopting a healthy lifestyle and getting preventive care are important in promoting health and wellness. Ask your health care provider about:  The right schedule for you to have regular tests and exams.  Things you can do on your own to prevent diseases and keep yourself healthy. What should I know about diet, weight, and exercise? Eat a healthy diet   Eat a diet that includes plenty of vegetables, fruits, low-fat dairy products, and lean protein.  Do not eat a lot of foods that are high in solid fats, added sugars, or sodium. Maintain a healthy weight Body mass index (BMI) is used to identify weight problems. It estimates body fat based on height and weight. Your health care provider can help determine your BMI and help you achieve or maintain a healthy weight. Get regular exercise Get regular exercise. This is one of the most important things you can do for your health. Most adults should:  Exercise for at least 150 minutes each week. The exercise should increase your heart rate and make you sweat (moderate-intensity exercise).  Do strengthening exercises at least twice a week. This is in addition to the moderate-intensity exercise.  Spend less time sitting. Even light physical activity can be beneficial. Watch cholesterol and blood lipids Have your blood tested for lipids and cholesterol at 65 years of age, then have this test every 5 years. Have your cholesterol levels checked more often if:  Your lipid or cholesterol levels are high.  You are older than 65 years of age.  You are at high risk for heart disease. What should I know about cancer  screening? Depending on your health history and family history, you may need to have cancer screening at various ages. This may include screening for:  Breast cancer.  Cervical cancer.  Colorectal cancer.  Skin cancer.  Lung cancer. What should I know about heart disease, diabetes, and high blood pressure? Blood pressure and heart disease  High blood pressure causes heart disease and increases the risk of stroke. This is more likely to develop in people who have high blood pressure readings, are of African descent, or are overweight.  Have your blood pressure checked: ? Every 3-5 years if you are 18-39 years of age. ? Every year if you are 40 years old or older. Diabetes Have regular diabetes screenings. This checks your fasting blood sugar level. Have the screening done:  Once every three years after age 40 if you are at a normal weight and have a low risk for diabetes.  More often and at a younger age if you are overweight or have a high risk for diabetes. What should I know about preventing infection? Hepatitis B If you have a higher risk for hepatitis B, you should be screened for this virus. Talk with your health care provider to find out if you are at risk for hepatitis B infection. Hepatitis C Testing is recommended for:  Everyone born from 1945 through 1965.  Anyone with known risk factors for hepatitis C. Sexually transmitted infections (STIs)  Get screened for STIs, including gonorrhea and chlamydia, if: ? You are sexually active and are younger than 65 years   of age. ? You are older than 65 years of age and your health care provider tells you that you are at risk for this type of infection. ? Your sexual activity has changed since you were last screened, and you are at increased risk for chlamydia or gonorrhea. Ask your health care provider if you are at risk.  Ask your health care provider about whether you are at high risk for HIV. Your health care provider may  recommend a prescription medicine to help prevent HIV infection. If you choose to take medicine to prevent HIV, you should first get tested for HIV. You should then be tested every 3 months for as long as you are taking the medicine. Pregnancy  If you are about to stop having your period (premenopausal) and you may become pregnant, seek counseling before you get pregnant.  Take 400 to 800 micrograms (mcg) of folic acid every day if you become pregnant.  Ask for birth control (contraception) if you want to prevent pregnancy. Osteoporosis and menopause Osteoporosis is a disease in which the bones lose minerals and strength with aging. This can result in bone fractures. If you are 65 years old or older, or if you are at risk for osteoporosis and fractures, ask your health care provider if you should:  Be screened for bone loss.  Take a calcium or vitamin D supplement to lower your risk of fractures.  Be given hormone replacement therapy (HRT) to treat symptoms of menopause. Follow these instructions at home: Lifestyle  Do not use any products that contain nicotine or tobacco, such as cigarettes, e-cigarettes, and chewing tobacco. If you need help quitting, ask your health care provider.  Do not use street drugs.  Do not share needles.  Ask your health care provider for help if you need support or information about quitting drugs. Alcohol use  Do not drink alcohol if: ? Your health care provider tells you not to drink. ? You are pregnant, may be pregnant, or are planning to become pregnant.  If you drink alcohol: ? Limit how much you use to 0-1 drink a day. ? Limit intake if you are breastfeeding.  Be aware of how much alcohol is in your drink. In the [place] one drink equals one 12 oz bottle of beer (355 mL), one 5 oz glass of wine (148 mL), or one 1 oz glass of hard liquor (44 mL). General instructions  Schedule regular health, dental, and eye exams.  Stay current with your  vaccines.  Tell your health care provider if: ? You often feel depressed. ? You have ever been abused or do not feel safe at home. Summary  Adopting a healthy lifestyle and getting preventive care are important in promoting health and wellness.  Follow your health care provider's instructions about healthy diet, exercising, and getting tested or screened for diseases.  Follow your health care provider's instructions on monitoring your cholesterol and blood pressure. This information is not intended to replace advice given to you by your health care provider. Make sure you discuss any questions you have with your health care provider. Document Revised: 07/18/2018 Document Reviewed: 07/18/2018 Elsevier Patient Education  2020 [employer]  

## 2019-09-11 ENCOUNTER — Encounter: Payer: Self-pay | Admitting: Internal Medicine

## 2019-09-11 ENCOUNTER — Ambulatory Visit (INDEPENDENT_AMBULATORY_CARE_PROVIDER_SITE_OTHER): Payer: Commercial Managed Care - PPO | Admitting: Internal Medicine

## 2019-09-11 ENCOUNTER — Other Ambulatory Visit: Payer: Self-pay

## 2019-09-11 VITALS — BP 154/96 | HR 87 | Temp 98.7°F | Resp 16 | Ht 64.0 in | Wt 149.8 lb

## 2019-09-11 DIAGNOSIS — F411 Generalized anxiety disorder: Secondary | ICD-10-CM

## 2019-09-11 DIAGNOSIS — I10 Essential (primary) hypertension: Secondary | ICD-10-CM | POA: Diagnosis not present

## 2019-09-11 DIAGNOSIS — M85852 Other specified disorders of bone density and structure, left thigh: Secondary | ICD-10-CM | POA: Diagnosis not present

## 2019-09-11 DIAGNOSIS — M1711 Unilateral primary osteoarthritis, right knee: Secondary | ICD-10-CM

## 2019-09-11 DIAGNOSIS — Z0000 Encounter for general adult medical examination without abnormal findings: Secondary | ICD-10-CM | POA: Diagnosis not present

## 2019-09-11 LAB — CBC WITH DIFFERENTIAL/PLATELET
Basophils Absolute: 0.1 10*3/uL (ref 0.0–0.1)
Basophils Relative: 0.8 % (ref 0.0–3.0)
Eosinophils Absolute: 0 10*3/uL (ref 0.0–0.7)
Eosinophils Relative: 0.7 % (ref 0.0–5.0)
HCT: 44.2 % (ref 36.0–46.0)
Hemoglobin: 15.4 g/dL — ABNORMAL HIGH (ref 12.0–15.0)
Lymphocytes Relative: 26.9 % (ref 12.0–46.0)
Lymphs Abs: 1.9 10*3/uL (ref 0.7–4.0)
MCHC: 35 g/dL (ref 30.0–36.0)
MCV: 90.1 fl (ref 78.0–100.0)
Monocytes Absolute: 0.4 10*3/uL (ref 0.1–1.0)
Monocytes Relative: 5.1 % (ref 3.0–12.0)
Neutro Abs: 4.7 10*3/uL (ref 1.4–7.7)
Neutrophils Relative %: 66.5 % (ref 43.0–77.0)
Platelets: 237 10*3/uL (ref 150.0–400.0)
RBC: 4.9 Mil/uL (ref 3.87–5.11)
RDW: 12.4 % (ref 11.5–15.5)
WBC: 7.1 10*3/uL (ref 4.0–10.5)

## 2019-09-11 LAB — LIPID PANEL
Cholesterol: 175 mg/dL (ref 0–200)
HDL: 53.8 mg/dL (ref >39.00)
LDL Cholesterol: 95 mg/dL (ref 0–99)
NonHDL: 121.59
Total CHOL/HDL Ratio: 3
Triglycerides: 131 mg/dL (ref 0.0–149.0)
VLDL: 26.2 mg/dL (ref 0.0–40.0)

## 2019-09-11 LAB — COMPREHENSIVE METABOLIC PANEL WITH GFR
ALT: 27 U/L (ref 0–35)
AST: 28 U/L (ref 0–37)
Albumin: 4.4 g/dL (ref 3.5–5.2)
Alkaline Phosphatase: 78 U/L (ref 39–117)
BUN: 11 mg/dL (ref 6–23)
CO2: 28 meq/L (ref 19–32)
Calcium: 9.2 mg/dL (ref 8.4–10.5)
Chloride: 104 meq/L (ref 96–112)
Creatinine, Ser: 0.68 mg/dL (ref 0.40–1.20)
GFR: 86.95 mL/min (ref >60.00)
Glucose, Bld: 84 mg/dL (ref 70–99)
Potassium: 3.8 meq/L (ref 3.5–5.1)
Sodium: 141 meq/L (ref 135–145)
Total Bilirubin: 0.6 mg/dL (ref 0.2–1.2)
Total Protein: 7 g/dL (ref 6.0–8.3)

## 2019-09-11 LAB — TSH: TSH: 0.95 u[IU]/mL (ref 0.35–4.50)

## 2019-09-11 NOTE — Assessment & Plan Note (Signed)
Chronic ? Up to date - she will check with gyn Continue regular walking Advised started vitamin - calcium if tolerate d- but she thinks that caused GI upset in past

## 2019-09-11 NOTE — Assessment & Plan Note (Signed)
Chronic Controlled, stable Continue current dose of medication prozac 20 mg daily 

## 2019-09-11 NOTE — Assessment & Plan Note (Signed)
Primary Taking meloxicam Discussed risks of long term use cmp

## 2019-09-11 NOTE — Assessment & Plan Note (Signed)
Chronic BP well controlled at home - her BP has been calibrated here Current regimen effective and well tolerated Continue current medications at current doses cmp

## 2019-09-17 ENCOUNTER — Other Ambulatory Visit: Payer: Self-pay | Admitting: Internal Medicine

## 2019-09-17 MED ORDER — LOSARTAN POTASSIUM 100 MG PO TABS: 100.0000 mg | ORAL_TABLET | Freq: Every day | ORAL | 2 refills | Status: DC

## 2019-09-18 ENCOUNTER — Encounter: Payer: Self-pay | Admitting: Internal Medicine

## 2019-09-20 ENCOUNTER — Encounter: Payer: Self-pay | Admitting: Internal Medicine

## 2019-10-08 ENCOUNTER — Other Ambulatory Visit: Payer: Self-pay

## 2019-10-08 ENCOUNTER — Ambulatory Visit
Admission: RE | Admit: 2019-10-08 | Discharge: 2019-10-08 | Disposition: A | Discharge status: Home / Self Care | Payer: Commercial Managed Care - PPO | Source: Ambulatory Visit | Attending: Internal Medicine | Admitting: Internal Medicine

## 2019-10-08 DIAGNOSIS — Z1231 Encounter for screening mammogram for malignant neoplasm of breast: Secondary | ICD-10-CM

## 2019-10-17 ENCOUNTER — Ambulatory Visit: Admit: 2019-10-17 | Discharge: 2019-11-18 | Payer: BLUE CROSS/BLUE SHIELD

## 2019-11-07 ENCOUNTER — Ambulatory Visit: Admit: 2019-11-07 | Payer: BLUE CROSS/BLUE SHIELD

## 2019-11-28 ENCOUNTER — Other Ambulatory Visit: Payer: Self-pay | Admitting: Internal Medicine

## 2020-03-20 ENCOUNTER — Encounter: Payer: Self-pay | Admitting: Internal Medicine

## 2020-03-20 DIAGNOSIS — H9193 Unspecified hearing loss, bilateral: Secondary | ICD-10-CM

## 2020-03-20 DIAGNOSIS — H938X3 Other specified disorders of ear, bilateral: Secondary | ICD-10-CM

## 2020-05-16 ENCOUNTER — Encounter: Payer: Self-pay | Admitting: Internal Medicine

## 2020-05-21 ENCOUNTER — Other Ambulatory Visit: Payer: Self-pay | Admitting: Internal Medicine

## 2020-06-22 ENCOUNTER — Encounter

## 2020-07-12 ENCOUNTER — Other Ambulatory Visit: Payer: Self-pay | Admitting: Internal Medicine

## 2020-07-20 DIAGNOSIS — C50919 Malignant neoplasm of unspecified site of unspecified female breast: Secondary | ICD-10-CM | POA: Insufficient documentation

## 2020-07-27 ENCOUNTER — Encounter: Payer: Self-pay | Admitting: Internal Medicine

## 2020-07-28 ENCOUNTER — Other Ambulatory Visit: Payer: Self-pay | Admitting: Internal Medicine

## 2020-07-28 MED ORDER — MELOXICAM 15 MG PO TABS: 15.0000 mg | ORAL_TABLET | Freq: Every day | ORAL | 0 refills | Status: DC

## 2020-10-19 ENCOUNTER — Other Ambulatory Visit: Payer: Self-pay | Admitting: Internal Medicine

## 2020-11-01 NOTE — Progress Notes (Signed)
Subjective:    Patient ID: Gabriela Carrillo, female    DOB: October 24, 1955, 65 y.o.   MRN: [id number]   This visit occurred during the SARS-CoV-2 public health emergency.  Safety protocols were in place, including screening questions prior to the visit, additional usage of staff PPE, and extensive cleaning of exam room while observing appropriate contact time as indicated for disinfecting solutions.    HPI She is here for a physical exam.   She is has difficulty sleeping at times.  She takes melatonin some nights.     Medications and allergies reviewed with patient and updated if appropriate.  Patient Active Problem List   Diagnosis Date Noted  . Osteoarthritis of right knee 09/11/2019  . S/P lumpectomy of breast 03/19/2018  . C. difficile diarrhea 06/07/2017  . Hypertension 05/03/2016  . 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 Allergic rhinitis 11/11/2015  . Osteopenia 09/09/2013  . Anxiety state 09/04/2009  . Irritable bowel syndrome 09/04/2009  . BREAST CANCER, HX OF 08/07/2008    Current Outpatient Medications on File Prior to Visit  Medication Sig Dispense Refill  . amLODipine (NORVASC) 2.5 MG tablet TAKE 1 TABLET BY MOUTH EVERY DAY 90 tablet 2  . Cholecalciferol (VITAMIN D3 PO) Take 1,000 capsules by mouth daily.    Marland Kitchen FLUoxetine (PROZAC) 20 MG capsule TAKE 1 CAPSULE BY MOUTH EVERY DAY 90 capsule 1  . losartan (COZAAR) 100 MG tablet TAKE 1 TABLET BY MOUTH EVERY DAY 90 tablet 2  . meloxicam (MOBIC) 15 MG tablet TAKE 1 TABLET (15 MG TOTAL) BY MOUTH DAILY. 90 tablet 0   No current facility-administered medications on file prior to visit.    Past Medical History:  Diagnosis Date  . Allergy to sulfa drugs    itching w/o rash  . Cancer (HCC)    BREAST,PMH OF  . Depression    PMH of  . 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 Headache 05/20/2017   Encouraged increased hydration, 64 ounces of clear fluids daily. Minimize alcohol and caffeine. Eat small frequent meals with  lean proteins and complex carbs. Avoid high and low blood sugars. Get adequate sleep, 7-8 hours a night. Needs exercise daily preferably in the morning. This is the first time she has had migrainous symptoms with N/V but denies photophobia, phonophobia or other neurologic c  . Hypertension    Has been taking Losartan for about a year.  . Hypokalemia    PMH OF    Past Surgical History:  Procedure Laterality Date  . BREAST LUMPECTOMY Left 2004   CHEMOTHERAPY  FOLLOWED BY RADIATION  . BREAST RECONSTRUCTION Left 12/18/2014   Procedure: DELAYED LEFT BREAST RECONSTRUCTION WITH PLACEMENT OF GEL  IMPLANT;  Surgeon: Crissie Reese, MD;  Location: [facility];  Service: Plastics;  Laterality: Left;  . BUNIONECTOMY BILATERALLY    . CESAREAN SECTION    . COLONOSCOPY  2011   negative  . G3 P2    . PLACEMENT OF BREAST IMPLANTS     left    Social History   Socioeconomic History  . Marital status: Married    Spouse name: Not on file  . Number of children: Not on file  . Years of education: Not on file  . Highest education level: Not on file  Occupational History  . Not on file  Tobacco Use  . Smoking status: Never Smoker  . Smokeless tobacco: Never Used  Substance and Sexual Activity  . Alcohol use: Yes  Comment:  2-3 per week  . Drug use: No  . Sexual activity: Not on file  Other Topics Concern  . Not on file  Social History Narrative   WALKS DAILY 40-45 MINS   Social Determinants of Health   Financial Resource Strain: Not on file  Food Insecurity: Not on file  Transportation Needs: Not on file  Physical Activity: Not on file  Stress: Not on file  Social Connections: Not on file    Family History  Problem Relation Age of Onset  . Diabetes Mother   . Anxiety disorder Mother   . Goiter Mother   . Hypertension Sister        [id number]  . Arthritis Sister        Rheumatoid   . Cancer Neg Hx   . Heart disease Neg Hx   . Stroke Neg Hx   . Colon cancer Neg Hx     Review of Systems   Constitutional: Negative for chills and fever.  Eyes: Negative for visual disturbance.  Respiratory: Negative for cough, shortness of breath and wheezing.   Cardiovascular: Negative for chest pain, palpitations and leg swelling.  Gastrointestinal: Negative for abdominal pain, blood in stool, constipation, diarrhea and nausea.       No gerd  Genitourinary: Negative for dysuria and hematuria.  Musculoskeletal: Positive for arthralgias (fingers, knees). Negative for back pain.  Skin: Negative for color change and rash.  Neurological: Positive for headaches (occ). Negative for dizziness and light-headedness.       Objective:   Vitals:   11/02/20 1058  BP: 128/70  Pulse: 85  Temp: 98.6 F (37 C)  SpO2: 98%   Filed Weights   11/02/20 1058  Weight: 146 lb (66.2 kg)   Body mass index is 25.06 kg/m.  BP Readings from Last 3 Encounters:  11/02/20 128/70  09/11/19 (!) 154/96  05/15/18 (!) 168/98    Wt Readings from Last 3 Encounters:  11/02/20 146 lb (66.2 kg)  09/11/19 149 lb 12.8 oz (67.9 kg)  05/15/18 148 lb (67.1 kg)    Depression screen [facility] 2/9 11/02/2020 05/09/2017  Decreased Interest 0 0  Down, Depressed, Hopeless 0 0  PHQ - 2 Score 0 0  Altered sleeping 1 -  Tired, decreased energy 0 -  Change in appetite 0 -  Feeling bad or failure about yourself  0 -  Trouble concentrating 0 -  Moving slowly or fidgety/restless 0 -  Suicidal thoughts 0 -  PHQ-9 Score 1 -  Difficult doing work/chores Not difficult at all -    GAD 7 : Generalized Anxiety Score 11/02/2020  Nervous, Anxious, on Edge 1  Control/stop worrying 0  Worry too much - different things 0  Trouble relaxing 0  Restless 0  Easily annoyed or irritable 0  Afraid - awful might happen 0  Total GAD 7 Score 1  Anxiety Difficulty Not difficult at all       Physical Exam Constitutional: She appears well-developed and well-nourished. No distress.  HENT:  Head: Normocephalic and atraumatic.  Right Ear:  External ear normal. Normal ear canal and TM Left Ear: External ear normal.  Normal ear canal and TM Mouth/Throat: Oropharynx is clear and moist.  Eyes: Conjunctivae and EOM are normal.  Neck: Neck supple. No tracheal deviation present. No thyromegaly present.  No carotid bruit  Cardiovascular: Normal rate, regular rhythm and normal heart sounds.   No murmur heard.  No edema. Pulmonary/Chest: Effort normal and breath sounds normal. No  respiratory distress. She has no wheezes. She has no rales.  Breast: deferred   Abdominal: Soft. She exhibits no distension. There is no tenderness.  Lymphadenopathy: She has no cervical adenopathy.  Skin: Skin is warm and dry. She is not diaphoretic.  Psychiatric: She has a normal mood and affect. Her behavior is normal.        Assessment & Plan:   Physical exam: Screening blood work    ordered Immunizations  Discussed shingrix, pneumonia Colonoscopy  Up to date  Mammogram  Due - will schedule Gyn  Up to date Dexa    Due - ordered for breast center Eye exams  Up to date  Exercise  Walking irregularly Weight  Good  Substance abuse  None Sees derm   Screened for depression using the PHQ 9 scale.  No evidence of depression.     See Problem List for Assessment and Plan of chronic medical problems.

## 2020-11-01 NOTE — Patient Instructions (Addendum)
Try Voltaren gel ( anti-inflammatory gel) for your knee arthritis.     Blood work was ordered.     Medications changes include :   none     Please followup in 1 year     Health Maintenance, Female Adopting a healthy lifestyle and getting preventive care are important in promoting health and wellness. Ask your health care provider about:  The right schedule for you to have regular tests and exams.  Things you can do on your own to prevent diseases and keep yourself healthy. What should I know about diet, weight, and exercise? Eat a healthy diet  Eat a diet that includes plenty of vegetables, fruits, low-fat dairy products, and lean protein.  Do not eat a lot of foods that are high in solid fats, added sugars, or sodium.   Maintain a healthy weight Body mass index (BMI) is used to identify weight problems. It estimates body fat based on height and weight. Your health care provider can help determine your BMI and help you achieve or maintain a healthy weight. Get regular exercise Get regular exercise. This is one of the most important things you can do for your health. Most adults should:  Exercise for at least 150 minutes each week. The exercise should increase your heart rate and make you sweat (moderate-intensity exercise).  Do strengthening exercises at least twice a week. This is in addition to the moderate-intensity exercise.  Spend less time sitting. Even light physical activity can be beneficial. Watch cholesterol and blood lipids Have your blood tested for lipids and cholesterol at 66 years of age, then have this test every 5 years. Have your cholesterol levels checked more often if:  Your lipid or cholesterol levels are high.  You are older than 66 years of age.  You are at high risk for heart disease. What should I know about cancer screening? Depending on your health history and family history, you may need to have cancer screening at various ages. This may  include screening for:  Breast cancer.  Cervical cancer.  Colorectal cancer.  Skin cancer.  Lung cancer. What should I know about heart disease, diabetes, and high blood pressure? Blood pressure and heart disease  High blood pressure causes heart disease and increases the risk of stroke. This is more likely to develop in people who have high blood pressure readings, are of African descent, or are overweight.  Have your blood pressure checked: ? Every 3-5 years if you are 48-43 years of age. ? Every year if you are 52 years old or older. Diabetes Have regular diabetes screenings. This checks your fasting blood sugar level. Have the screening done:  Once every three years after age 64 if you are at a normal weight and have a low risk for diabetes.  More often and at a younger age if you are overweight or have a high risk for diabetes. What should I know about preventing infection? Hepatitis B If you have a higher risk for hepatitis B, you should be screened for this virus. Talk with your health care provider to find out if you are at risk for hepatitis B infection. Hepatitis C Testing is recommended for:  Everyone born from [birth year] through 1966.  Anyone with known risk factors for hepatitis C. Sexually transmitted infections (STIs)  Get screened for STIs, including gonorrhea and chlamydia, if: ? You are sexually active and are younger than 66 years of age. ? You are older than 66 years of age and  your health care provider tells you that you are at risk for this type of infection. ? Your sexual activity has changed since you were last screened, and you are at increased risk for chlamydia or gonorrhea. Ask your health care provider if you are at risk.  Ask your health care provider about whether you are at high risk for HIV. Your health care provider may recommend a prescription medicine to help prevent HIV infection. If you choose to take medicine to prevent HIV, you should first  get tested for HIV. You should then be tested every 3 months for as long as you are taking the medicine. Pregnancy  If you are about to stop having your period (premenopausal) and you may become pregnant, seek counseling before you get pregnant.  Take 400 to 800 micrograms (mcg) of folic acid every day if you become pregnant.  Ask for birth control (contraception) if you want to prevent pregnancy. Osteoporosis and menopause Osteoporosis is a disease in which the bones lose minerals and strength with aging. This can result in bone fractures. If you are 72 years old or older, or if you are at risk for osteoporosis and fractures, ask your health care provider if you should:  Be screened for bone loss.  Take a calcium or vitamin D supplement to lower your risk of fractures.  Be given hormone replacement therapy (HRT) to treat symptoms of menopause. Follow these instructions at home: Lifestyle  Do not use any products that contain nicotine or tobacco, such as cigarettes, e-cigarettes, and chewing tobacco. If you need help quitting, ask your health care provider.  Do not use street drugs.  Do not share needles.  Ask your health care provider for help if you need support or information about quitting drugs. Alcohol use  Do not drink alcohol if: ? Your health care provider tells you not to drink. ? You are pregnant, may be pregnant, or are planning to become pregnant.  If you drink alcohol: ? Limit how much you use to 0-1 drink a day. ? Limit intake if you are breastfeeding.  Be aware of how much alcohol is in your drink. In the [place] one drink equals one 12 oz bottle of beer (355 mL), one 5 oz glass of wine (148 mL), or one 1 oz glass of hard liquor (44 mL). General instructions  Schedule regular health, dental, and eye exams.  Stay current with your vaccines.  Tell your health care provider if: ? You often feel depressed. ? You have ever been abused or do not feel safe at  home. Summary  Adopting a healthy lifestyle and getting preventive care are important in promoting health and wellness.  Follow your health care provider's instructions about healthy diet, exercising, and getting tested or screened for diseases.  Follow your health care provider's instructions on monitoring your cholesterol and blood pressure. This information is not intended to replace advice given to you by your health care provider. Make sure you discuss any questions you have with your health care provider. Document Revised: 07/18/2018 Document Reviewed: 07/18/2018 Elsevier Patient Education  2021 [employer].

## 2020-11-02 ENCOUNTER — Encounter: Payer: Self-pay | Admitting: Internal Medicine

## 2020-11-02 ENCOUNTER — Other Ambulatory Visit: Payer: Self-pay

## 2020-11-02 ENCOUNTER — Ambulatory Visit (INDEPENDENT_AMBULATORY_CARE_PROVIDER_SITE_OTHER): Payer: Commercial Managed Care - PPO | Admitting: Internal Medicine

## 2020-11-02 VITALS — BP 128/70 | HR 85 | Temp 98.6°F | Ht 64.0 in | Wt 146.0 lb

## 2020-11-02 DIAGNOSIS — F411 Generalized anxiety disorder: Secondary | ICD-10-CM | POA: Diagnosis not present

## 2020-11-02 DIAGNOSIS — Z0000 Encounter for general adult medical examination without abnormal findings: Secondary | ICD-10-CM | POA: Diagnosis not present

## 2020-11-02 DIAGNOSIS — M1711 Unilateral primary osteoarthritis, right knee: Secondary | ICD-10-CM

## 2020-11-02 DIAGNOSIS — M85852 Other specified disorders of bone density and structure, left thigh: Secondary | ICD-10-CM | POA: Diagnosis not present

## 2020-11-02 DIAGNOSIS — I10 Essential (primary) hypertension: Secondary | ICD-10-CM | POA: Diagnosis not present

## 2020-11-02 DIAGNOSIS — Z853 Personal history of malignant neoplasm of breast: Secondary | ICD-10-CM

## 2020-11-02 LAB — LIPID PANEL
Cholesterol: 186 mg/dL (ref 0–200)
HDL: 53.1 mg/dL (ref >39.00)
LDL Cholesterol: 110 mg/dL — ABNORMAL HIGH (ref 0–99)
NonHDL: 132.93
Total CHOL/HDL Ratio: 4
Triglycerides: 116 mg/dL (ref 0.0–149.0)
VLDL: 23.2 mg/dL (ref 0.0–40.0)

## 2020-11-02 LAB — CBC WITH DIFFERENTIAL/PLATELET
Basophils Absolute: 0 10*3/uL (ref 0.0–0.1)
Basophils Relative: 0.5 % (ref 0.0–3.0)
Eosinophils Absolute: 0 10*3/uL (ref 0.0–0.7)
Eosinophils Relative: 0.3 % (ref 0.0–5.0)
HCT: 45.3 % (ref 36.0–46.0)
Hemoglobin: 15.8 g/dL — ABNORMAL HIGH (ref 12.0–15.0)
Lymphocytes Relative: 23.3 % (ref 12.0–46.0)
Lymphs Abs: 1.7 10*3/uL (ref 0.7–4.0)
MCHC: 34.8 g/dL (ref 30.0–36.0)
MCV: 88.6 fl (ref 78.0–100.0)
Monocytes Absolute: 0.4 10*3/uL (ref 0.1–1.0)
Monocytes Relative: 5.3 % (ref 3.0–12.0)
Neutro Abs: 5.3 10*3/uL (ref 1.4–7.7)
Neutrophils Relative %: 70.6 % (ref 43.0–77.0)
Platelets: 234 10*3/uL (ref 150.0–400.0)
RBC: 5.11 Mil/uL (ref 3.87–5.11)
RDW: 12.5 % (ref 11.5–15.5)
WBC: 7.5 10*3/uL (ref 4.0–10.5)

## 2020-11-02 LAB — COMPREHENSIVE METABOLIC PANEL
ALT: 24 U/L (ref 0–35)
AST: 25 U/L (ref 0–37)
Albumin: 4.6 g/dL (ref 3.5–5.2)
Alkaline Phosphatase: 76 U/L (ref 39–117)
BUN: 11 mg/dL (ref 6–23)
CO2: 27 mEq/L (ref 19–32)
Calcium: 9.3 mg/dL (ref 8.4–10.5)
Chloride: 102 mEq/L (ref 96–112)
Creatinine, Ser: 0.68 mg/dL (ref 0.40–1.20)
GFR: 91.2 mL/min (ref >60.00)
Glucose, Bld: 87 mg/dL (ref 70–99)
Potassium: 3.9 mEq/L (ref 3.5–5.1)
Sodium: 138 mEq/L (ref 135–145)
Total Bilirubin: 0.7 mg/dL (ref 0.2–1.2)
Total Protein: 7.1 g/dL (ref 6.0–8.3)

## 2020-11-02 LAB — TSH: TSH: 1.15 u[IU]/mL (ref 0.35–4.50)

## 2020-11-02 NOTE — Assessment & Plan Note (Signed)
Chronic BP well controlled Continue amlodipine 2.5 mg daily, losartan 100 mg daily cmp

## 2020-11-02 NOTE — Assessment & Plan Note (Signed)
Chronic Controlled, stable Continue fluoxetine 20 mg daily 

## 2020-11-02 NOTE — Assessment & Plan Note (Signed)
Chronic DEXA due-ordered for the breast center Encouraged regular walking Continue vitamin D

## 2020-11-02 NOTE — Assessment & Plan Note (Signed)
History of breast cancer No evidence of recurrence Following with her GYN-up-to-date Due now for [facility] will schedule

## 2020-11-02 NOTE — Assessment & Plan Note (Signed)
Chronic Taking meloxicam some days only - she is aware of risks of long term use Try Tylenol and Voltaren gel Has seen ortho in past - had steroid inj - helped some Can tolerate at this point - wants to avoid surgery for now

## 2020-11-09 ENCOUNTER — Other Ambulatory Visit: Payer: Self-pay | Admitting: Internal Medicine

## 2020-11-09 DIAGNOSIS — Z1231 Encounter for screening mammogram for malignant neoplasm of breast: Secondary | ICD-10-CM

## 2020-11-24 ENCOUNTER — Other Ambulatory Visit: Payer: Self-pay | Admitting: Internal Medicine

## 2020-12-29 ENCOUNTER — Ambulatory Visit
Admission: RE | Admit: 2020-12-29 | Discharge: 2020-12-29 | Disposition: A | Discharge status: Home / Self Care | Payer: Commercial Managed Care - PPO | Source: Ambulatory Visit | Attending: Internal Medicine | Admitting: Internal Medicine

## 2020-12-29 ENCOUNTER — Other Ambulatory Visit: Payer: Self-pay

## 2020-12-29 DIAGNOSIS — Z1231 Encounter for screening mammogram for malignant neoplasm of breast: Secondary | ICD-10-CM

## 2020-12-29 HISTORY — DX: Personal history of antineoplastic chemotherapy: Z92.21

## 2020-12-29 HISTORY — DX: Personal history of irradiation: Z92.3

## 2020-12-30 ENCOUNTER — Ambulatory Visit: Payer: Commercial Managed Care - PPO

## 2021-04-11 ENCOUNTER — Other Ambulatory Visit: Payer: Self-pay | Admitting: Internal Medicine

## 2021-04-11 ENCOUNTER — Encounter: Payer: Self-pay | Admitting: Internal Medicine

## 2021-04-14 MED ORDER — LOSARTAN POTASSIUM 100 MG PO TABS: 100.0000 mg | ORAL_TABLET | Freq: Every day | ORAL | 1 refills | Status: DC

## 2021-04-19 ENCOUNTER — Encounter: Payer: Self-pay | Admitting: Internal Medicine

## 2021-04-21 ENCOUNTER — Telehealth: Payer: Self-pay | Admitting: Internal Medicine

## 2021-04-21 MED ORDER — DIAZEPAM 5 MG PO TABS: ORAL_TABLET | ORAL | 0 refills | Status: DC

## 2021-04-21 NOTE — Telephone Encounter (Signed)
Patient has been speaking w/ provider via MyChart  Patient has dental procedure scheduled this afternoon at 4 & is waiting for provider to send over Valium to pharmacy  Please send over so patient can have before appt  Pharmacy: [facility] [place], [place] - [address]  Phone:  [phone] Fax:  [phone]

## 2021-04-21 NOTE — Telephone Encounter (Signed)
Rx sent.  She can not drive after taking the medication

## 2021-04-21 NOTE — Telephone Encounter (Signed)
Spoke with patient today. 

## 2021-04-28 ENCOUNTER — Other Ambulatory Visit: Payer: Self-pay | Admitting: Internal Medicine

## 2021-05-03 ENCOUNTER — Encounter: Payer: Self-pay | Admitting: Internal Medicine

## 2021-05-03 MED ORDER — DIAZEPAM 5 MG PO TABS: ORAL_TABLET | ORAL | 0 refills | Status: DC

## 2021-05-05 MED ORDER — DIAZEPAM 5 MG PO TABS: ORAL_TABLET | ORAL | 0 refills | Status: DC

## 2021-05-05 NOTE — Addendum Note (Signed)
Addended by: Binnie Rail on: 05/05/2021 07:38 AM   Modules accepted: Orders

## 2021-07-07 ENCOUNTER — Encounter: Payer: Self-pay | Admitting: Internal Medicine

## 2021-07-08 ENCOUNTER — Encounter: Payer: Self-pay | Admitting: Internal Medicine

## 2021-07-08 DIAGNOSIS — G43909 Migraine, unspecified, not intractable, without status migrainosus: Secondary | ICD-10-CM | POA: Insufficient documentation

## 2021-07-08 NOTE — Progress Notes (Signed)
Virtual Visit via Video Note  I connected with Gabriela Carrillo on 07/08/21 at 11:20 AM EST by a video enabled telemedicine application and verified that I am speaking with the correct person using two identifiers.   I discussed the limitations of evaluation and management by telemedicine and the availability of in person appointments. The patient expressed understanding and agreed to proceed.  Present for the visit:  Myself, Dr Billey Gosling, Gabriela Carrillo.  The patient is currently at home and I am in the office.    No referring provider.    History of Present Illness: This is an acute visit for headaches and elevated BP  She started having cold symptoms 11/18 after she had the flu vaccine.  On 11/23 she tested positive for COVID last Wednesday finally tested negative.  Over the weekend she started getting pounding headaches and initially thought it may have been her migraines, but she checked her blood pressure and realized it was quite elevated-160s/87-99.  She has been taking Tylenol or ibuprofen for the headache and it has helped.  She is taking her blood pressure medication as prescribed.  She is not sure if this is related to COVID or the flu vaccine.  Headaches are often waking her up at 3 to 4:00 in the morning    Review of Systems  Constitutional:  Negative for chills and fever.  Respiratory:  Positive for cough (mild, residual - feels like in her throat). Negative for sputum production and shortness of breath.   Cardiovascular:  Negative for chest pain, palpitations and leg swelling.  Neurological:  Positive for headaches. Negative for dizziness.     Social History   Socioeconomic History   Marital status: Married    Spouse name: Not on file   Number of children: Not on file   Years of education: Not on file   Highest education level: Not on file  Occupational History   Not on file  Tobacco Use   Smoking status: Never   Smokeless tobacco: Never  Substance and Sexual  Activity   Alcohol use: Yes    Comment:  2-3 per week   Drug use: No   Sexual activity: Not on file  Other Topics Concern   Not on file  Social History Narrative   WALKS DAILY 40-45 MINS   Social Determinants of Health   Financial Resource Strain: Not on file  Food Insecurity: Not on file  Transportation Needs: Not on file  Physical Activity: Not on file  Stress: Not on file  Social Connections: Not on file     Observations/Objective: Appears well in NAD Breathing normally Skin appears warm and dry  Assessment and Plan:  See Problem List for Assessment and Plan of chronic medical problems.   Follow Up Instructions:    I discussed the assessment and treatment plan with the patient. The patient was provided an opportunity to ask questions and all were answered. The patient agreed with the plan and demonstrated an understanding of the instructions.   The patient was advised to call back or seek an in-person evaluation if the symptoms worsen or if the condition fails to improve as anticipated.    Binnie Rail, MD

## 2021-07-08 NOTE — Telephone Encounter (Signed)
Patient calling in  Wanted to make sure provider & nurse received messages via mychart (please see below)  Call patient when available [phone]

## 2021-07-09 ENCOUNTER — Telehealth (INDEPENDENT_AMBULATORY_CARE_PROVIDER_SITE_OTHER): Payer: PPO | Admitting: Internal Medicine

## 2021-07-09 ENCOUNTER — Other Ambulatory Visit: Payer: Self-pay

## 2021-07-09 DIAGNOSIS — G4489 Other headache syndrome: Secondary | ICD-10-CM | POA: Diagnosis not present

## 2021-07-09 DIAGNOSIS — G43809 Other migraine, not intractable, without status migrainosus: Secondary | ICD-10-CM

## 2021-07-09 DIAGNOSIS — I10 Essential (primary) hypertension: Secondary | ICD-10-CM

## 2021-07-09 MED ORDER — HYDROCHLOROTHIAZIDE 25 MG PO TABS
25.0000 mg | ORAL_TABLET | Freq: Every day | ORAL | 5 refills | Status: DC
Start: 2021-07-09 — End: 2021-11-29

## 2021-07-09 NOTE — Assessment & Plan Note (Signed)
Chronic Blood pressure was well controlled up until recently as far she knows, but she does not typically check her blood pressure on a regular basis Continue losartan 100 mg daily, amlodipine 2.5 mg daily Start hydrochlorothiazide 25 mg daily She will continue to monitor her BP and update me next week on her readings Can increase amlodipine if needed Will have her come into the office sometime in January for in person follow-up, possibly sooner depending on readings next week Low-sodium diet

## 2021-07-09 NOTE — Assessment & Plan Note (Signed)
Acute Has been experiencing headaches for the past several days-pounding in nature and likely related to elevated blood pressure Okay to take Tylenol or ibuprofen as needed We will adjust blood pressure medications, which will hopefully improve headaches, if not will need further evaluation-does not sound like her typical migraines

## 2021-07-15 ENCOUNTER — Encounter: Payer: Self-pay | Admitting: Internal Medicine

## 2021-07-15 NOTE — Telephone Encounter (Signed)
Patient calling in  Patient says she has tested positive for covid again today 12.08.22.Marland Kitchen Patient says she is not feeling well overall  Wants to know what she needs to do.. patient had recent virtual visit w/ provider 12.02  Please call patient [phone]

## 2021-08-18 DIAGNOSIS — Z6825 Body mass index (BMI) 25.0-25.9, adult: Secondary | ICD-10-CM | POA: Diagnosis not present

## 2021-08-18 DIAGNOSIS — Z01419 Encounter for gynecological examination (general) (routine) without abnormal findings: Secondary | ICD-10-CM | POA: Diagnosis not present

## 2021-08-23 ENCOUNTER — Encounter: Payer: Self-pay | Admitting: Internal Medicine

## 2021-08-24 MED ORDER — DIAZEPAM 5 MG PO TABS: ORAL_TABLET | ORAL | 0 refills | Status: DC

## 2021-09-06 ENCOUNTER — Other Ambulatory Visit: Payer: Self-pay | Admitting: Internal Medicine

## 2021-10-14 DIAGNOSIS — N958 Other specified menopausal and perimenopausal disorders: Secondary | ICD-10-CM | POA: Diagnosis not present

## 2021-10-14 DIAGNOSIS — M816 Localized osteoporosis [Lequesne]: Secondary | ICD-10-CM | POA: Diagnosis not present

## 2021-10-27 ENCOUNTER — Other Ambulatory Visit: Payer: Self-pay | Admitting: Internal Medicine

## 2021-10-27 DIAGNOSIS — Z853 Personal history of malignant neoplasm of breast: Secondary | ICD-10-CM | POA: Diagnosis not present

## 2021-10-27 DIAGNOSIS — Z9889 Other specified postprocedural states: Secondary | ICD-10-CM | POA: Diagnosis not present

## 2021-10-27 DIAGNOSIS — T66XXXA Radiation sickness, unspecified, initial encounter: Secondary | ICD-10-CM | POA: Insufficient documentation

## 2021-10-28 ENCOUNTER — Encounter: Payer: Self-pay | Admitting: Internal Medicine

## 2021-10-28 NOTE — Progress Notes (Signed)
? ? ?Subjective:  ? ? Patient ID: Gabriela Carrillo, female    DOB: 11-07-1955, 66 y.o.   MRN: [id number] ? ? ?This visit occurred during the SARS-CoV-2 public health emergency.  Safety protocols were in place, including screening questions prior to the visit, additional usage of staff PPE, and extensive cleaning of exam room while observing appropriate contact time as indicated for disinfecting solutions. ? ? ? ?HPI ?Gabriela Carrillo is here for  ?Chief Complaint  ?Patient presents with  ? Annual Exam  ?  Patient wants Vitamin D level checked also  ? ? ?Left breast implant has changed - will have an MRI.  She likely will have some of her abdominal fat put into the breast to help even out the size of her breasts. ? ? ? ?Medications and allergies reviewed with patient and updated if appropriate. ? ? ? ?Current Outpatient Medications on File Prior to Visit  ?Medication Sig Dispense Refill  ? amLODipine (NORVASC) 2.5 MG tablet TAKE 1 TABLET BY MOUTH EVERY DAY 90 tablet 2  ? ascorbic acid (VITAMIN C) 500 MG tablet Take 1 tablet by mouth daily.    ? Cholecalciferol (VITAMIN D3 PO) Take 2,000 capsules by mouth daily.    ? FLUoxetine (PROZAC) 20 MG capsule TAKE 1 CAPSULE BY MOUTH EVERY DAY 90 capsule 1  ? hydrochlorothiazide (HYDRODIURIL) 25 MG tablet Take 1 tablet (25 mg total) by mouth daily. 30 tablet 5  ? losartan (COZAAR) 100 MG tablet TAKE 1 TABLET(100 MG) BY MOUTH DAILY 90 tablet 1  ? Menaquinone-7 (VITAMIN K2 PO) Take by mouth daily.    ? Omega-3 1000 MG CAPS Take by mouth.    ? diazepam (VALIUM) 5 MG tablet diazepam 5 mg tablet ? TAKE 1 TABLET BY MOUTH PRIOR TO DENTAL WORK. MAY REPEAT 1 TIME IF NEEDED. DO NOT DRIVE AFTER TAKING MEDICATION. (Patient not taking: Reported on 10/29/2021)    ? ?No current facility-administered medications on file prior to visit.  ? ? ?Review of Systems  ?Constitutional:  Negative for fever.  ?Eyes:  Negative for visual disturbance.  ?Respiratory:  Negative for cough, shortness of breath and wheezing.    ?Cardiovascular:  Negative for chest pain, palpitations and leg swelling.  ?Gastrointestinal:  Negative for abdominal pain, blood in stool, constipation, diarrhea and nausea.  ?     Occ gerd ?  ?Genitourinary:  Negative for dysuria.  ?Musculoskeletal:  Positive for arthralgias (mild - better w/ supplements). Negative for back pain.  ?Skin:  Negative for rash.  ?Neurological:  Negative for light-headedness and headaches.  ?Psychiatric/Behavioral:  Negative for dysphoric mood. The patient is nervous/anxious.   ? ?   ?Objective:  ? ?Vitals:  ? 10/29/21 1010  ?BP: 124/80  ?Pulse: 79  ?Temp: 98.6 ?F (37 ?C)  ?SpO2: 96%  ? ?Filed Weights  ? 10/29/21 1010  ?Weight: 148 lb (67.1 kg)  ? ?Body mass index is 25.4 kg/m?. ? ?BP Readings from Last 3 Encounters:  ?10/29/21 124/80  ?11/02/20 128/70  ?09/11/19 (!) 154/96  ? ? ?Wt Readings from Last 3 Encounters:  ?10/29/21 148 lb (67.1 kg)  ?11/02/20 146 lb (66.2 kg)  ?09/11/19 149 lb 12.8 oz (67.9 kg)  ? ? ? ?  10/29/2021  ? 10:21 AM 11/02/2020  ? 12:01 PM 05/09/2017  ?  3:03 PM  ?Depression screen PHQ 2/9  ?Decreased Interest 0 0 0  ?Down, Depressed, Hopeless 0 0 0  ?PHQ - 2 Score 0 0 0  ?Altered sleeping  1   ?Tired, decreased energy  0   ?Change in appetite  0   ?Feeling bad or failure about yourself   0   ?Trouble concentrating  0   ?Moving slowly or fidgety/restless  0   ?Suicidal thoughts  0   ?PHQ-9 Score  1   ?Difficult doing work/chores  Not difficult at all   ? ? ? ? ?  11/02/2020  ? 12:01 PM  ?GAD 7 : Generalized Anxiety Score  ?Nervous, Anxious, on Edge 1  ?Control/stop worrying 0  ?Worry too much - different things 0  ?Trouble relaxing 0  ?Restless 0  ?Easily annoyed or irritable 0  ?Afraid - awful might happen 0  ?Total GAD 7 Score 1  ?Anxiety Difficulty Not difficult at all  ? ? ? ? ?  ?Physical Exam ?Constitutional: She appears well-developed and well-nourished. No distress.  ?HENT:  ?Head: Normocephalic and atraumatic.  ?Right Ear: External ear normal. Normal ear  canal and TM ?Left Ear: External ear normal.  Normal ear canal and TM ?Mouth/Throat: Oropharynx is clear and moist.  ?Eyes: Conjunctivae and EOM are normal.  ?Neck: Neck supple. No tracheal deviation present. No thyromegaly present.  ?No carotid bruit  ?Cardiovascular: Normal rate, regular rhythm and normal heart sounds.   ?No murmur heard.  No edema. ?Pulmonary/Chest: Effort normal and breath sounds normal. No respiratory distress. She has no wheezes. She has no rales.  ?Breast: deferred   ?Abdominal: Soft. She exhibits no distension. There is no tenderness.  ?Lymphadenopathy: She has no cervical adenopathy.  ?Skin: Skin is warm and dry. She is not diaphoretic.  ?Psychiatric: She has a normal mood and affect. Her behavior is normal.  ? ? ? ?Lab Results  ?Component Value Date  ? WBC 7.5 11/02/2020  ? HGB 15.8 (H) 11/02/2020  ? HCT 45.3 11/02/2020  ? PLT 234.0 11/02/2020  ? GLUCOSE 87 11/02/2020  ? CHOL 186 11/02/2020  ? TRIG 116.0 11/02/2020  ? HDL 53.10 11/02/2020  ? LDLCALC 110 (H) 11/02/2020  ? ALT 24 11/02/2020  ? AST 25 11/02/2020  ? NA 138 11/02/2020  ? K 3.9 11/02/2020  ? CL 102 11/02/2020  ? CREATININE 0.68 11/02/2020  ? BUN 11 11/02/2020  ? CO2 27 11/02/2020  ? TSH 1.15 11/02/2020  ? HGBA1C 4.8 05/03/2016  ? ? ? ? ?   ?Assessment & Plan:  ? ?Physical exam: ?Screening blood work  ordered ?Exercise  treadmill, yard work, will start walking again ?Weight  ok ?Substance abuse  none ? ? ?Reviewed recommended immunizations.  Prevnar 20 today ? ? ?Health Maintenance  ?Topic Date Due  ? Pneumonia Vaccine 52+ Years old (1 - PCV) Never done  ? COVID-19 Vaccine (6 - Booster) 11/14/2021 (Originally 07/28/2020)  ? Zoster Vaccines- Shingrix (1 of 2) 01/29/2022 (Originally [birth year]10/1974)  ? DEXA SCAN  10/30/2022 (Originally 11/27/2009)  ? COLONOSCOPY (Pts 45-20yr Insurance coverage will need to be confirmed)  03/28/2022  ? MAMMOGRAM  12/30/2022  ? TETANUS/TDAP  10/21/2027  ? INFLUENZA VACCINE  Completed  ? Hepatitis C  Screening  Completed  ? HPV VACCINES  Aged Out  ?  ? ? ? ? ? ? ?See Problem List for Assessment and Plan of chronic medical problems. ? ? ? ? ?

## 2021-10-28 NOTE — Patient Instructions (Addendum)
? ?Prevnar 20 vaccine given.  ? ? ?Blood work was ordered.   ? ? ?Medications changes include :   None ? ? ?Your prescription(s) have been sent to your pharmacy.  ? ? ?Return in about 1 year (around 10/30/2022) for CPE. ? ? ? ?Health Maintenance, Female ?Adopting a healthy lifestyle and getting preventive care are important in promoting health and wellness. Ask your health care provider about: ?The right schedule for you to have regular tests and exams. ?Things you can do on your own to prevent diseases and keep yourself healthy. ?What should I know about diet, weight, and exercise? ?Eat a healthy diet ? ?Eat a diet that includes plenty of vegetables, fruits, low-fat dairy products, and lean protein. ?Do not eat a lot of foods that are high in solid fats, added sugars, or sodium. ?Maintain a healthy weight ?Body mass index (BMI) is used to identify weight problems. It estimates body fat based on height and weight. Your health care provider can help determine your BMI and help you achieve or maintain a healthy weight. ?Get regular exercise ?Get regular exercise. This is one of the most important things you can do for your health. Most adults should: ?Exercise for at least 150 minutes each week. The exercise should increase your heart rate and make you sweat (moderate-intensity exercise). ?Do strengthening exercises at least twice a week. This is in addition to the moderate-intensity exercise. ?Spend less time sitting. Even light physical activity can be beneficial. ?Watch cholesterol and blood lipids ?Have your blood tested for lipids and cholesterol at 67 years of age, then have this test every 5 years. ?Have your cholesterol levels checked more often if: ?Your lipid or cholesterol levels are high. ?You are older than 67 years of age. ?You are at high risk for heart disease. ?What should I know about cancer screening? ?Depending on your health history and family history, you may need to have cancer screening at  various ages. This may include screening for: ?Breast cancer. ?Cervical cancer. ?Colorectal cancer. ?Skin cancer. ?Lung cancer. ?What should I know about heart disease, diabetes, and high blood pressure? ?Blood pressure and heart disease ?High blood pressure causes heart disease and increases the risk of stroke. This is more likely to develop in people who have high blood pressure readings or are overweight. ?Have your blood pressure checked: ?Every 3-5 years if you are 12-94 years of age. ?Every year if you are 67 years old or older. ?Diabetes ?Have regular diabetes screenings. This checks your fasting blood sugar level. Have the screening done: ?Once every three years after age 19 if you are at a normal weight and have a low risk for diabetes. ?More often and at a younger age if you are overweight or have a high risk for diabetes. ?What should I know about preventing infection? ?Hepatitis B ?If you have a higher risk for hepatitis B, you should be screened for this virus. Talk with your health care provider to find out if you are at risk for hepatitis B infection. ?Hepatitis C ?Testing is recommended for: ?Everyone born from [birth year] through 1966. ?Anyone with known risk factors for hepatitis C. ?Sexually transmitted infections (STIs) ?Get screened for STIs, including gonorrhea and chlamydia, if: ?You are sexually active and are younger than 67 years of age. ?You are older than 67 years of age and your health care provider tells you that you are at risk for this type of infection. ?Your sexual activity has changed since you were last  screened, and you are at increased risk for chlamydia or gonorrhea. Ask your health care provider if you are at risk. ?Ask your health care provider about whether you are at high risk for HIV. Your health care provider may recommend a prescription medicine to help prevent HIV infection. If you choose to take medicine to prevent HIV, you should first get tested for HIV. You should then be  tested every 3 months for as long as you are taking the medicine. ?Pregnancy ?If you are about to stop having your period (premenopausal) and you may become pregnant, seek counseling before you get pregnant. ?Take 400 to 800 micrograms (mcg) of folic acid every day if you become pregnant. ?Ask for birth control (contraception) if you want to prevent pregnancy. ?Osteoporosis and menopause ?Osteoporosis is a disease in which the bones lose minerals and strength with aging. This can result in bone fractures. If you are 14 years old or older, or if you are at risk for osteoporosis and fractures, ask your health care provider if you should: ?Be screened for bone loss. ?Take a calcium or vitamin D supplement to lower your risk of fractures. ?Be given hormone replacement therapy (HRT) to treat symptoms of menopause. ?Follow these instructions at home: ?Alcohol use ?Do not drink alcohol if: ?Your health care provider tells you not to drink. ?You are pregnant, may be pregnant, or are planning to become pregnant. ?If you drink alcohol: ?Limit how much you have to: ?0-1 drink a day. ?Know how much alcohol is in your drink. In the [place] one drink equals one 12 oz bottle of beer (355 mL), one 5 oz glass of wine (148 mL), or one 1? oz glass of hard liquor (44 mL). ?Lifestyle ?Do not use any products that contain nicotine or tobacco. These products include cigarettes, chewing tobacco, and vaping devices, such as e-cigarettes. If you need help quitting, ask your health care provider. ?Do not use street drugs. ?Do not share needles. ?Ask your health care provider for help if you need support or information about quitting drugs. ?General instructions ?Schedule regular health, dental, and eye exams. ?Stay current with your vaccines. ?Tell your health care provider if: ?You often feel depressed. ?You have ever been abused or do not feel safe at home. ?Summary ?Adopting a healthy lifestyle and getting preventive care are important in  promoting health and wellness. ?Follow your health care provider's instructions about healthy diet, exercising, and getting tested or screened for diseases. ?Follow your health care provider's instructions on monitoring your cholesterol and blood pressure. ?This information is not intended to replace advice given to you by your health care provider. Make sure you discuss any questions you have with your health care provider. ?Document Revised: 12/14/2020 Document Reviewed: 12/14/2020 ?Elsevier Patient Education ? [employer]. ? ?

## 2021-10-29 ENCOUNTER — Ambulatory Visit (INDEPENDENT_AMBULATORY_CARE_PROVIDER_SITE_OTHER): Payer: PPO | Admitting: Internal Medicine

## 2021-10-29 ENCOUNTER — Other Ambulatory Visit: Payer: Self-pay

## 2021-10-29 VITALS — BP 124/80 | HR 79 | Temp 98.6°F | Ht 64.0 in | Wt 148.0 lb

## 2021-10-29 DIAGNOSIS — Z23 Encounter for immunization: Secondary | ICD-10-CM

## 2021-10-29 DIAGNOSIS — M810 Age-related osteoporosis without current pathological fracture: Secondary | ICD-10-CM

## 2021-10-29 DIAGNOSIS — I10 Essential (primary) hypertension: Secondary | ICD-10-CM

## 2021-10-29 DIAGNOSIS — F411 Generalized anxiety disorder: Secondary | ICD-10-CM

## 2021-10-29 DIAGNOSIS — Z0000 Encounter for general adult medical examination without abnormal findings: Secondary | ICD-10-CM

## 2021-10-29 LAB — CBC WITH DIFFERENTIAL/PLATELET
Basophils Absolute: 0 10*3/uL (ref 0.0–0.1)
Basophils Relative: 0.5 % (ref 0.0–3.0)
Eosinophils Absolute: 0 10*3/uL (ref 0.0–0.7)
Eosinophils Relative: 0.4 % (ref 0.0–5.0)
HCT: 43.2 % (ref 36.0–46.0)
Hemoglobin: 15 g/dL (ref 12.0–15.0)
Lymphocytes Relative: 27.5 % (ref 12.0–46.0)
Lymphs Abs: 2.1 10*3/uL (ref 0.7–4.0)
MCHC: 34.9 g/dL (ref 30.0–36.0)
MCV: 89.1 fl (ref 78.0–100.0)
Monocytes Absolute: 0.6 10*3/uL (ref 0.1–1.0)
Monocytes Relative: 8.2 % (ref 3.0–12.0)
Neutro Abs: 4.7 10*3/uL (ref 1.4–7.7)
Neutrophils Relative %: 63.4 % (ref 43.0–77.0)
Platelets: 259 10*3/uL (ref 150.0–400.0)
RBC: 4.85 Mil/uL (ref 3.87–5.11)
RDW: 12.5 % (ref 11.5–15.5)
WBC: 7.5 10*3/uL (ref 4.0–10.5)

## 2021-10-29 LAB — LIPID PANEL
Cholesterol: 175 mg/dL (ref 0–200)
HDL: 50 mg/dL (ref >39.00)
LDL Cholesterol: 89 mg/dL (ref 0–99)
NonHDL: 124.98
Total CHOL/HDL Ratio: 3
Triglycerides: 178 mg/dL — ABNORMAL HIGH (ref 0.0–149.0)
VLDL: 35.6 mg/dL (ref 0.0–40.0)

## 2021-10-29 LAB — COMPREHENSIVE METABOLIC PANEL
ALT: 25 U/L (ref 0–35)
AST: 26 U/L (ref 0–37)
Albumin: 4.5 g/dL (ref 3.5–5.2)
Alkaline Phosphatase: 69 U/L (ref 39–117)
BUN: 12 mg/dL (ref 6–23)
CO2: 32 mEq/L (ref 19–32)
Calcium: 9.4 mg/dL (ref 8.4–10.5)
Chloride: 100 mEq/L (ref 96–112)
Creatinine, Ser: 0.63 mg/dL (ref 0.40–1.20)
GFR: 92.25 mL/min (ref >60.00)
Glucose, Bld: 93 mg/dL (ref 70–99)
Potassium: 3 mEq/L — ABNORMAL LOW (ref 3.5–5.1)
Sodium: 140 mEq/L (ref 135–145)
Total Bilirubin: 0.6 mg/dL (ref 0.2–1.2)
Total Protein: 7 g/dL (ref 6.0–8.3)

## 2021-10-29 NOTE — Assessment & Plan Note (Signed)
Chronic ?Blood pressure well controlled ?CMP ?Continue HCTZ 25 mg daily, losartan 100 mg daily, amlodipine 2.5 mg daily ?

## 2021-10-29 NOTE — Assessment & Plan Note (Signed)
Chronic ?Controlled, Stable ?Check TSH ?Continue fluoxetine 20 mg daily ?

## 2021-10-29 NOTE — Assessment & Plan Note (Signed)
Chronic ?dexa up to date - done at gyn's office ?Has OP - new  ?Increased supplements, stressed regular treadmill/walking and advised weights ?Would like to avoid medication for OP ?

## 2021-11-02 ENCOUNTER — Encounter: Payer: Self-pay | Admitting: Internal Medicine

## 2021-11-02 LAB — TSH: TSH: 0.83 u[IU]/mL (ref 0.35–5.50)

## 2021-11-03 DIAGNOSIS — Z23 Encounter for immunization: Secondary | ICD-10-CM

## 2021-11-03 DIAGNOSIS — I10 Essential (primary) hypertension: Secondary | ICD-10-CM | POA: Diagnosis not present

## 2021-11-03 DIAGNOSIS — M810 Age-related osteoporosis without current pathological fracture: Secondary | ICD-10-CM | POA: Diagnosis not present

## 2021-11-03 DIAGNOSIS — Z0000 Encounter for general adult medical examination without abnormal findings: Secondary | ICD-10-CM | POA: Diagnosis not present

## 2021-11-03 DIAGNOSIS — F411 Generalized anxiety disorder: Secondary | ICD-10-CM | POA: Diagnosis not present

## 2021-11-03 NOTE — Addendum Note (Signed)
Addended by: Marcina Millard on: 11/03/2021 08:00 AM ? ? Modules accepted: Orders ? ?

## 2021-11-18 DIAGNOSIS — Z9889 Other specified postprocedural states: Secondary | ICD-10-CM | POA: Diagnosis not present

## 2021-11-18 DIAGNOSIS — Z853 Personal history of malignant neoplasm of breast: Secondary | ICD-10-CM | POA: Diagnosis not present

## 2021-11-18 DIAGNOSIS — Z9882 Breast implant status: Secondary | ICD-10-CM | POA: Diagnosis not present

## 2021-11-19 ENCOUNTER — Other Ambulatory Visit: Payer: Self-pay | Admitting: Internal Medicine

## 2021-11-19 DIAGNOSIS — Z1231 Encounter for screening mammogram for malignant neoplasm of breast: Secondary | ICD-10-CM

## 2021-11-29 ENCOUNTER — Other Ambulatory Visit: Payer: Self-pay | Admitting: Internal Medicine

## 2021-11-29 ENCOUNTER — Encounter: Payer: PPO | Admitting: Internal Medicine

## 2021-12-31 ENCOUNTER — Ambulatory Visit: Payer: PPO

## 2022-01-04 ENCOUNTER — Ambulatory Visit
Admission: RE | Admit: 2022-01-04 | Discharge: 2022-01-04 | Disposition: A | Discharge status: Home / Self Care | Payer: PPO | Source: Ambulatory Visit | Attending: Internal Medicine | Admitting: Internal Medicine

## 2022-01-04 ENCOUNTER — Other Ambulatory Visit: Payer: Self-pay | Admitting: Internal Medicine

## 2022-01-04 DIAGNOSIS — Z1231 Encounter for screening mammogram for malignant neoplasm of breast: Secondary | ICD-10-CM

## 2022-01-19 DIAGNOSIS — Z853 Personal history of malignant neoplasm of breast: Secondary | ICD-10-CM | POA: Diagnosis not present

## 2022-01-19 DIAGNOSIS — Z9889 Other specified postprocedural states: Secondary | ICD-10-CM | POA: Diagnosis not present

## 2022-01-19 DIAGNOSIS — T66XXXA Radiation sickness, unspecified, initial encounter: Secondary | ICD-10-CM | POA: Diagnosis not present

## 2022-01-27 ENCOUNTER — Other Ambulatory Visit: Payer: Self-pay | Admitting: Internal Medicine

## 2022-02-01 DIAGNOSIS — Z853 Personal history of malignant neoplasm of breast: Secondary | ICD-10-CM | POA: Diagnosis not present

## 2022-02-01 DIAGNOSIS — T66XXXA Radiation sickness, unspecified, initial encounter: Secondary | ICD-10-CM | POA: Diagnosis not present

## 2022-03-01 ENCOUNTER — Other Ambulatory Visit: Payer: Self-pay | Admitting: Internal Medicine

## 2022-03-17 ENCOUNTER — Other Ambulatory Visit: Payer: Self-pay | Admitting: Internal Medicine

## 2022-05-19 ENCOUNTER — Encounter: Payer: Self-pay | Admitting: Internal Medicine

## 2022-05-21 ENCOUNTER — Ambulatory Visit
Admission: RE | Admit: 2022-05-21 | Discharge: 2022-05-21 | Disposition: A | Discharge status: Home / Self Care | Payer: PPO | Source: Ambulatory Visit | Attending: Urgent Care | Admitting: Urgent Care

## 2022-05-21 VITALS — BP 143/85 | HR 90 | Temp 99.1°F | Resp 16

## 2022-05-21 DIAGNOSIS — J069 Acute upper respiratory infection, unspecified: Secondary | ICD-10-CM | POA: Diagnosis not present

## 2022-05-21 DIAGNOSIS — J040 Acute laryngitis: Secondary | ICD-10-CM | POA: Diagnosis not present

## 2022-05-21 DIAGNOSIS — Z1152 Encounter for screening for COVID-19: Secondary | ICD-10-CM | POA: Diagnosis not present

## 2022-05-21 LAB — RESP PANEL BY RT-PCR (RSV, FLU A&B, COVID)  RVPGX2
Influenza A by PCR: NEGATIVE
Influenza B by PCR: NEGATIVE
Resp Syncytial Virus by PCR: NEGATIVE
SARS Coronavirus 2 by RT PCR: NEGATIVE

## 2022-05-21 MED ORDER — BENZONATATE 100 MG PO CAPS: 100.0000 mg | ORAL_CAPSULE | Freq: Three times a day (TID) | ORAL | 0 refills | Status: DC | PRN

## 2022-05-21 NOTE — ED Provider Notes (Signed)
[facility]    CSN: [id number] Arrival date & time: 05/21/22  1308      History   Chief Complaint Chief Complaint  Patient presents with  . Cough    Sore throat, 99.2 temp - Entered by patient  . Sore Throat    HPI Lenix Benoist is a 66 y.o. female.   Pleasant 66 year old female presents today due to a sore throat and cough for the past 2 days.  States her husband has similar symptoms, and has had them for 12 days.  Patient is here primarily because her husband is undergoing a bone marrow biopsy and treatment for cancer.  She wants to make sure she does not have anything that would cause problems for him.  She also has been in and out of the hospital recently due to illness in family members.  She does not consistently wear a mask.  Took 2 home COVID test, both negative.  She does not smoke.  No chronic pulmonary issues.  Had a temp of 100.2 today, which resolved with Tylenol.  She has been taking over-the-counter Delsym which has been effective for cough.  She denies chest pain, palpitations, shortness of breath, wheezing.  Feels that her voice is becoming hoarse. Throat is scratchy, otherwise denies additional URI sx.    Cough Sore Throat   Past Medical History:  Diagnosis Date  . Allergy to sulfa drugs    itching w/o rash  . Cancer (HCC)    BREAST,PMH OF  . Depression    PMH of  . 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 Headache 05/20/2017   Encouraged increased hydration, 64 ounces of clear fluids daily. Minimize alcohol and caffeine. Eat small frequent meals with lean proteins and complex carbs. Avoid high and low blood sugars. Get adequate sleep, 7-8 hours a night. Needs exercise daily preferably in the morning. This is the first time she has had migrainous symptoms with N/V but denies photophobia, phonophobia or other neurologic c  . Hypertension    Has been taking Losartan for about a year.  . Hypokalemia    PMH OF  . Personal history of  chemotherapy   . Personal history of radiation therapy     Patient Active Problem List   Diagnosis Date Noted  . Migraine headache 07/08/2021  . Osteoarthritis of right knee 09/11/2019  . S/P lumpectomy of breast 03/19/2018  . C. difficile diarrhea 06/07/2017  . Hypertension 05/03/2016  . 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 Allergic rhinitis 11/11/2015  . Osteoporosis 09/09/2013  . Anxiety state 09/04/2009  . Irritable bowel syndrome 09/04/2009  . BREAST CANCER, HX OF 08/07/2008    Past Surgical History:  Procedure Laterality Date  . Abdominal Fat Transfer for Breast Augmentation Bilateral   . AUGMENTATION MAMMAPLASTY Left   . BREAST LUMPECTOMY Left 2004   CHEMOTHERAPY  FOLLOWED BY RADIATION  . BREAST RECONSTRUCTION Left 12/18/2014   Procedure: DELAYED LEFT BREAST RECONSTRUCTION WITH PLACEMENT OF GEL  IMPLANT;  Surgeon: Crissie Reese, MD;  Location: [facility];  Service: Plastics;  Laterality: Left;  . BUNIONECTOMY BILATERALLY    . CESAREAN SECTION    . COLONOSCOPY  2011   negative  . G3 P2    . PLACEMENT OF BREAST IMPLANTS     left    OB History   No obstetric history on file.      Home Medications    Prior to Admission medications   Medication Sig Start Date End  Date Taking? Authorizing Provider  amLODipine (NORVASC) 2.5 MG tablet TAKE 1 TABLET BY MOUTH EVERY DAY 01/27/22  Yes Burns, Claudina Lick, MD  ascorbic acid (VITAMIN C) 500 MG tablet Take 1 tablet by mouth daily.   Yes [provider]  benzonatate (TESSALON PERLES) 100 MG capsule Take 1 capsule (100 mg total) by mouth 3 (three) times daily as needed for cough. 05/21/22  Yes Ketsia Linebaugh L, PA  Cholecalciferol (VITAMIN D3 PO) Take 2,000 capsules by mouth daily.   Yes [provider]  FLUoxetine (PROZAC) 20 MG capsule TAKE 1 CAPSULE BY MOUTH EVERY DAY 03/01/22  Yes Burns, Claudina Lick, MD  hydrochlorothiazide (HYDRODIURIL) 25 MG tablet TAKE 1 TABLET(25 MG) BY MOUTH DAILY 11/29/21  Yes Burns, Claudina Lick, MD  losartan  (COZAAR) 100 MG tablet TAKE 1 TABLET(100 MG) BY MOUTH DAILY 03/17/22  Yes Burns, Claudina Lick, MD  Menaquinone-7 (VITAMIN K2 PO) Take by mouth daily.   Yes [provider]  Omega-3 1000 MG CAPS Take by mouth.   Yes [provider]  VITAMIN E PO Take by mouth.   Yes [provider]    Family History Family History  Problem Relation Age of Onset  . Diabetes Mother   . Anxiety disorder Mother   . Goiter Mother   . Hypertension Sister        [id number]  . Arthritis Sister        Rheumatoid   . Cancer Neg Hx   . Heart disease Neg Hx   . Stroke Neg Hx   . Colon cancer Neg Hx     Social History Social History   Tobacco Use  . Smoking status: Never  . Smokeless tobacco: Never  Vaping Use  . Vaping Use: Never used  Substance Use Topics  . Alcohol use: Not Currently    Comment: rare  . Drug use: No     Allergies   Sulfonamide derivatives   Review of Systems Review of Systems  Respiratory:  Positive for cough.      Physical Exam Triage Vital Signs ED Triage Vitals  Enc Vitals Group     BP 05/21/22 1010 (!) 143/85     Pulse Rate 05/21/22 1010 90     Resp 05/21/22 1010 16     Temp 05/21/22 1010 99.1 F (37.3 C)     Temp Source 05/21/22 1010 Oral     SpO2 05/21/22 1010 97 %     Weight --      Height --      Head Circumference --      Peak Flow --      Pain Score 05/21/22 1026 5     Pain Loc --      Pain Edu? --      Excl. in [place]? --    No data found.  Updated Vital Signs BP (!) 143/85 (BP Location: Right Arm)   Pulse 90   Temp 99.1 F (37.3 C) (Oral)   Resp 16   SpO2 97%   Visual Acuity Right Eye Distance:   Left Eye Distance:   Bilateral Distance:    Right Eye Near:   Left Eye Near:    Bilateral Near:     Physical Exam   UC Treatments / Results  Labs (all labs ordered are listed, but only abnormal results are displayed) Labs Reviewed  RESP PANEL BY RT-PCR (RSV, FLU A&B, COVID)  [id number]    EKG   Radiology No results  found.  Procedures Procedures (including critical care time)  Medications Ordered in UC Medications - No data to display  Initial Impression / Assessment and Plan / UC Course  I have reviewed the triage vital signs and the nursing notes.  Pertinent labs & imaging results that were available during my care of the patient were reviewed by me and considered in my medical decision making (see chart for details).     *** Final Clinical Impressions(s) / UC Diagnoses   Final diagnoses:  Viral upper respiratory tract infection with cough  Laryngitis   Discharge Instructions   None    ED Prescriptions     Medication Sig Dispense Auth. Provider   benzonatate (TESSALON PERLES) 100 MG capsule Take 1 capsule (100 mg total) by mouth 3 (three) times daily as needed for cough. 20 capsule Jamason Peckham L, PA      PDMP not reviewed this encounter.

## 2022-05-21 NOTE — ED Triage Notes (Signed)
C/O cough, sore throat, hoarse voice onset approx 3-4 days ago. Temp up to 100.2 this AM. Has been taking Delsym and Tyl. Home Covid test negative 48 hrs ago and again today States spouse was just ill with similar sxs x 12 days and finally went on abx with improvement; spouse currently with cancer and scheduled to undergo bone marrow bx in a few days; pt concerned for her sxs due to his health status.

## 2022-05-21 NOTE — Discharge Instructions (Signed)
Your symptoms sound most consistent with a viral illness. Supportive care is the mainstay of treatment - rest, hydration, frequent hand washing, tylenol or ibuprofen as needed for fever or aches.  We have swabbed you for covid, flu, RSV. We will call if the results are positive. You can take the cough suppressant called in today up to every 8 hours as needed. If your symptoms continue or persist, or if you develop high fevers, shortness of breath, or sputum production, return for a recheck.

## 2022-05-22 ENCOUNTER — Other Ambulatory Visit: Payer: Self-pay | Admitting: Internal Medicine

## 2022-05-27 ENCOUNTER — Encounter: Payer: Self-pay | Admitting: Gastroenterology

## 2022-06-01 DIAGNOSIS — T66XXXD Radiation sickness, unspecified, subsequent encounter: Secondary | ICD-10-CM | POA: Diagnosis not present

## 2022-06-01 DIAGNOSIS — Z9889 Other specified postprocedural states: Secondary | ICD-10-CM | POA: Diagnosis not present

## 2022-06-01 DIAGNOSIS — Z853 Personal history of malignant neoplasm of breast: Secondary | ICD-10-CM | POA: Diagnosis not present

## 2022-06-22 ENCOUNTER — Telehealth: Payer: Self-pay | Admitting: Internal Medicine

## 2022-06-22 ENCOUNTER — Encounter: Payer: Self-pay | Admitting: Internal Medicine

## 2022-06-22 MED ORDER — NIRMATRELVIR/RITONAVIR (PAXLOVID)TABLET: 3.0000 | ORAL_TABLET | Freq: Two times a day (BID) | ORAL | 0 refills | Status: AC

## 2022-06-22 NOTE — Telephone Encounter (Signed)
My-chart message sent to Dr. Quay Burow already.

## 2022-06-22 NOTE — Telephone Encounter (Signed)
Pt called to report testing today +COVID. Pt says she was negative last night. Spouse tested positive yesterday and his cancer doctor called in [place].  Pt request RX for PAXLOVID or something.  Preferred Pharmacy:    [facility] [place], [place] - [address] AT [facility] [address] [place], [place] [address] Phone: [phone]  Fax: [phone]   Pt ph [phone]

## 2022-07-03 ENCOUNTER — Other Ambulatory Visit: Payer: Self-pay | Admitting: Internal Medicine

## 2022-07-08 NOTE — Progress Notes (Unsigned)
Subjective:   Gabriela Carrillo is a 66 y.o. female who presents for an Initial Medicare Annual Wellness Visit. I connected with  Gabriela Carrillo on 07/11/22 by a audio enabled telemedicine application and verified that I am speaking with the correct person using two identifiers.  Patient Location: Home  Provider Location: Home Office  I discussed the limitations of evaluation and management by telemedicine. The patient expressed understanding and agreed to proceed.  Review of Systems    Deferred to PCP Cardiac Risk Factors include: advanced age (>54mn, >>80women);hypertension     Objective:    There were no vitals filed for this visit. There is no height or weight on file to calculate BMI.     07/11/2022    9:24 AM 03/14/2017    3:47 PM 12/10/2014    8:23 AM  Advanced Directives  Does Patient Have a Medical Advance Directive? No No No  Would patient like information on creating a medical advance directive? No - Patient declined  No - patient declined information    Current Medications (verified) Outpatient Encounter Medications as of 07/11/2022  Medication Sig   amLODipine (NORVASC) 2.5 MG tablet TAKE 1 TABLET BY MOUTH EVERY DAY   ascorbic acid (VITAMIN C) 500 MG tablet Take 1 tablet by mouth daily.   Cholecalciferol (VITAMIN D3 PO) Take 2,000 capsules by mouth daily.   FLUoxetine (PROZAC) 20 MG capsule TAKE 1 CAPSULE BY MOUTH EVERY DAY   hydrochlorothiazide (HYDRODIURIL) 25 MG tablet TAKE 1 TABLET(25 MG) BY MOUTH DAILY   losartan (COZAAR) 100 MG tablet TAKE 1 TABLET(100 MG) BY MOUTH DAILY   Menaquinone-7 (VITAMIN K2 PO) Take by mouth daily.   Omega-3 1000 MG CAPS Take by mouth.   VITAMIN E PO Take by mouth.   benzonatate (TESSALON PERLES) 100 MG capsule Take 1 capsule (100 mg total) by mouth 3 (three) times daily as needed for cough.   No facility-administered encounter medications on file as of 07/11/2022.    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 Minimize alcohol and caffeine. Eat small frequent meals with lean proteins and complex carbs. Avoid high and low blood sugars. Get adequate sleep, 7-8 hours a night. Needs exercise daily preferably in the morning. This is the first time she has had migrainous symptoms with N/V but denies photophobia, phonophobia or other neurologic c   Hypertension    Has been taking Losartan for about a year.   Hypokalemia    PMH OF   Personal history of chemotherapy    Personal history of radiation therapy    Past Surgical History:  Procedure Laterality Date   Abdominal Fat Transfer for Breast Augmentation Bilateral    AUGMENTATION MAMMAPLASTY Left    BREAST LUMPECTOMY Left 2004   CHEMOTHERAPY  FOLLOWED BY RADIATION   BREAST RECONSTRUCTION Left 12/18/2014   Procedure: DELAYED LEFT BREAST RECONSTRUCTION WITH PLACEMENT OF GEL  IMPLANT;  Surgeon: DCrissie Reese MD;  [facility]  Service: Plastics;  Laterality: Left;   BUNIONECTOMY BILATERALLY     CESAREAN SECTION     COLONOSCOPY  2011   negative   G3 P2  PLACEMENT OF BREAST IMPLANTS     left   Family History  Problem Relation Age of Onset   Diabetes Mother    Anxiety disorder Mother    Goiter Mother    Hypertension Sister        X3   Arthritis Sister        Rheumatoid    Cancer Neg Hx    Heart disease Neg Hx    Stroke Neg Hx    Colon cancer Neg Hx    Social History   Socioeconomic History   Marital status: Married    Spouse name: Gabriela Carrillo   Number of children: Not on file   Years of education: Not on file   Highest education level: Not on file  Occupational History   Not on file  Tobacco Use   Smoking status: Never   Smokeless tobacco: Never  Vaping  Use   Vaping Use: Never used  Substance and Sexual Activity   Alcohol use: Not Currently    Comment: rare   Drug use: No   Sexual activity: Not on file  Other Topics Concern   Not on file  Social History Narrative   WALKS DAILY 40-45 MINS   Social Determinants of Health   Financial Resource Strain: Low Risk  (07/11/2022)   Overall Financial Resource Strain (CARDIA)    Difficulty of Paying Living Expenses: Not hard at all  Food Insecurity: No Food Insecurity (07/11/2022)   Hunger Vital Sign    Worried About Running Out of Food in the Last Year: Never true    [place] in the Last Year: Never true  Transportation Needs: No Transportation Needs (07/11/2022)   PRAPARE - Hydrologist (Medical): No    Lack of Transportation (Non-Medical): No  Physical Activity: Sufficiently Active (07/11/2022)   Exercise Vital Sign    Days of Exercise per Week: 4 days    Minutes of Exercise per Session: 40 min  Stress: Stress Concern Present (07/11/2022)   [place]    Feeling of Stress : To some extent  Social Connections: Socially Integrated (07/11/2022)   Social Connection and Isolation Panel [NHANES]    Frequency of Communication with Friends and Family: More than three times a week    Frequency of Social Gatherings with Friends and Family: More than three times a week    Attends Religious Services: 1 to 4 times per year    Active Member of [employer] or Organizations: Yes    Attends Archivist Meetings: 1 to 4 times per year    Marital Status: Married    Tobacco Counseling Counseling given: Not Answered   Clinical Intake:  Pre-visit preparation completed: Yes  Pain : No/denies pain     Nutritional Status: BMI 25 -29 Overweight Nutritional Risks: None Diabetes: No  How often do you need to have someone help you when you read instructions, pamphlets, or other written materials  from your doctor or pharmacy?: 1 - Never  Diabetic?No  Interpreter Needed?: No  Information entered by :: Emelia Loron RN   Activities of Daily Living    07/11/2022    9:23 AM  In your present state of health, do you have any difficulty performing the following activities:  Hearing? 0  Vision? 0  Difficulty concentrating or making decisions? 0  Walking or climbing stairs? 0  Dressing or bathing? 0  Doing errands, shopping? 0  Preparing Food  and eating ? N  Using the Toilet? N  In the past six months, have you accidently leaked urine? N  Do you have problems with loss of bowel control? N  Managing your Medications? N  Managing your Finances? N  Housekeeping or managing your Housekeeping? N    Patient Care Team: Binnie Rail, MD as PCP - General (Internal Medicine)  Indicate any recent Medical Services you may have received from other than [facility] providers in the past year (date may be approximate).     Assessment:   This is a routine wellness examination for Gabriela Carrillo.  Hearing/Vision screen No results found.  Dietary issues and exercise activities discussed: Current Exercise Habits: Home exercise routine, Type of exercise: walking, Time (Minutes): 40, Frequency (Times/Week): 4, Weekly Exercise (Minutes/Week): 160, Intensity: Mild, Exercise limited by: None identified   Goals Addressed             This Visit's Progress    Patient Stated       Continue to eat healthy and stay active.      Depression Screen    07/11/2022    9:20 AM 10/29/2021   10:21 AM 11/02/2020   12:01 PM 05/09/2017    3:03 PM  PHQ 2/9 Scores  PHQ - 2 Score 1 0 0 0  PHQ- 9 Score   1     Fall Risk    07/11/2022    9:25 AM 10/29/2021   10:20 AM 11/02/2020   11:02 AM 09/11/2019    8:33 AM 05/09/2017    3:03 PM  [place] in the past year? 0 0 0 1 No  Number falls in past yr: 0 0 0 0   Injury with Fall? 0 0 0 1   Risk for fall due to : No Fall Risks No Fall Risks     Follow up Falls  evaluation completed Falls evaluation completed Falls evaluation completed      [place]:  Any stairs in or around the home? Yes  If so, are there any without handrails? Yes  Home free of loose throw rugs in walkways, pet beds, electrical cords, etc? Yes  Adequate lighting in your home to reduce risk of falls? Yes   ASSISTIVE DEVICES UTILIZED TO PREVENT FALLS:  Life alert? No  Use of a cane, walker or w/c? No  Grab bars in the bathroom? No  Shower chair or bench in shower? No  Elevated toilet seat or a handicapped toilet? No   Cognitive Function:        07/11/2022    9:25 AM  6CIT Screen  What Year? 0 points  What month? 0 points  What time? 0 points  Count back from 20 0 points  Months in reverse 0 points  Repeat phrase 0 points  Total Score 0 points    Immunizations Immunization History  Administered Date(s) Administered   Influenza Inj Mdck Quad Pf 04/30/2019   Influenza Split 05/18/2014   Influenza Whole 05/25/2010   Influenza,inj,Quad PF,6+ Mos 05/31/2015, 05/03/2016, 05/09/2017, 05/15/2018   Influenza,inj,quad, With Preservative 05/31/2015   Influenza-Unspecified 06/27/2013, 04/19/2019, 05/16/2020, 06/25/2021   PFIZER(Purple Top)SARS-COV-2 Vaccination 10/19/2019, 11/09/2019, 06/02/2020   PNEUMOCOCCAL CONJUGATE-20 11/03/2021   Td 08/09/2007   Tdap 10/20/2017   Unspecified SARS-COV-2 Vaccination 10/19/2019, 11/09/2019    TDAP status: Up to date  Flu Vaccine status: Due, Education has been provided regarding the importance of this vaccine. Advised may receive this vaccine  at local pharmacy or Health Dept. Aware to provide a copy of the vaccination record if obtained from local pharmacy or Health Dept. Verbalized acceptance and understanding.  Pneumococcal vaccine status: Up to date  Covid-19 vaccine status: Information provided on how to obtain vaccines.   Qualifies for Shingles Vaccine? Yes   Zostavax completed No    Shingrix Completed?: No.    Education has been provided regarding the importance of this vaccine. Patient has been advised to call insurance company to determine out of pocket expense if they have not yet received this vaccine. Advised may also receive vaccine at local pharmacy or Health Dept. Verbalized acceptance and understanding.  Screening Tests Health Maintenance  Topic Date Due   Zoster Vaccines- Shingrix (1 of 2) Never done   INFLUENZA VACCINE  03/08/2022   COLONOSCOPY (Pts 45-70yr Insurance coverage will need to be confirmed)  03/28/2022   COVID-19 Vaccine (6 - 2023-24 season) 04/08/2022   DEXA SCAN  10/30/2022 (Originally 11/27/2009)   Medicare Annual Wellness (AWV)  07/12/2023   MAMMOGRAM  01/05/2024   DTaP/Tdap/Td (3 - Td or Tdap) 10/21/2027   Pneumonia Vaccine 67 Years old  Completed   Hepatitis C Screening  Completed   HPV VACCINES  Aged Out    Health Maintenance  Health Maintenance Due  Topic Date Due   Zoster Vaccines- Shingrix (1 of 2) Never done   INFLUENZA VACCINE  03/08/2022   COLONOSCOPY (Pts 45-422yrInsurance coverage will need to be confirmed)  03/28/2022   COVID-19 Vaccine (6 - 2023-24 season) 04/08/2022    Colorectal cancer screening: Type of screening: Colonoscopy. Completed 03/28/17. Repeat every 7 years Received a letter from GI stating repeat colonoscopy is due 8/25  Mammogram status: Completed 01/04/22. Repeat every year  Bone Density Status: Education provide; referral declined  Lung Cancer Screening: (Low Dose CT Chest recommended if Age 67-80ears, 30 pack-year currently smoking OR have quit w/in 15years.) does not qualify.   Additional Screening:  Hepatitis C Screening: does qualify; Completed 05/03/16  Vision Screening: Recommended annual ophthalmology exams for early detection of glaucoma and other disorders of the eye. Is the patient up to date with their annual eye exam?  Yes  Who is the provider or what is the name of the office in  which the patient attends annual eye exams?  If pt is not established with a provider, would they like to be referred to a provider to establish care?  N/A .   Dental Screening: Recommended annual dental exams for proper oral hygiene  Community Resource Referral / Chronic Care Management: CRR required this visit?  No   CCM required this visit?  No      Plan:     I have personally reviewed and noted the following in the patient's chart:   Medical and social history Use of alcohol, tobacco or illicit drugs  Current medications and supplements including opioid prescriptions. Patient is not currently taking opioid prescriptions. Functional ability and status Nutritional status Physical activity Advanced directives List of other physicians Hospitalizations, surgeries, and ER visits in previous 12 months Vitals Screenings to include cognitive, depression, and falls Referrals and appointments  In addition, I have reviewed and discussed with patient certain preventive protocols, quality metrics, and best practice recommendations. A written personalized care plan for preventive services as well as general preventive health recommendations were provided to patient.     JiMichiel CowboyRN   07/11/2022   Nurse Notes:  Ms. SmBuckels Thank you  for taking time to come for your Medicare Wellness Visit. I appreciate your ongoing commitment to your health goals. Please review the following plan we discussed and let me know if I can assist you in the future.   These are the goals we discussed:  Goals      Patient Stated     Continue to eat healthy and stay active.        This is a list of the screening recommended for you and due dates:  Health Maintenance  Topic Date Due   Zoster (Shingles) Vaccine (1 of 2) Never done   Flu Shot  03/08/2022   Colon Cancer Screening  03/28/2022   COVID-19 Vaccine (6 - 2023-24 season) 04/08/2022   DEXA scan (bone density measurement)  10/30/2022*    Medicare Annual Wellness Visit  07/12/2023   Mammogram  01/05/2024   DTaP/Tdap/Td vaccine (3 - Td or Tdap) 10/21/2027   Pneumonia Vaccine  Completed   Hepatitis C Screening: USPSTF Recommendation to screen - Ages 27-79 yo.  Completed   HPV Vaccine  Aged Out  *Topic was postponed. The date shown is not the original due date.

## 2022-07-11 ENCOUNTER — Ambulatory Visit (INDEPENDENT_AMBULATORY_CARE_PROVIDER_SITE_OTHER): Payer: PPO | Admitting: *Deleted

## 2022-07-11 DIAGNOSIS — Z0000 Encounter for general adult medical examination without abnormal findings: Secondary | ICD-10-CM | POA: Diagnosis not present

## 2022-07-27 ENCOUNTER — Other Ambulatory Visit: Payer: Self-pay | Admitting: Internal Medicine

## 2022-09-16 DIAGNOSIS — Z6824 Body mass index (BMI) 24.0-24.9, adult: Secondary | ICD-10-CM | POA: Diagnosis not present

## 2022-09-16 DIAGNOSIS — Z124 Encounter for screening for malignant neoplasm of cervix: Secondary | ICD-10-CM | POA: Diagnosis not present

## 2022-09-16 DIAGNOSIS — Z1151 Encounter for screening for human papillomavirus (HPV): Secondary | ICD-10-CM | POA: Diagnosis not present

## 2022-10-04 ENCOUNTER — Other Ambulatory Visit: Payer: Self-pay | Admitting: Internal Medicine

## 2022-11-08 ENCOUNTER — Encounter: Payer: Self-pay | Admitting: Internal Medicine

## 2022-11-08 NOTE — Patient Instructions (Addendum)
An EKG was done today.   Blood work was ordered.   The lab is on the first floor.    Medications changes include :   Add in calcium  - at least 600 mg in supplements -- total daily goal of calcium intake is 1200 mg     Return in about 1 year (around 11/09/2023) for Physical Exam.   Health Maintenance, Female Adopting a healthy lifestyle and getting preventive care are important in promoting health and wellness. Ask your health care provider about: The right schedule for you to have regular tests and exams. Things you can do on your own to prevent diseases and keep yourself healthy. What should I know about diet, weight, and exercise? Eat a healthy diet  Eat a diet that includes plenty of vegetables, fruits, low-fat dairy products, and lean protein. Do not eat a lot of foods that are high in solid fats, added sugars, or sodium. Maintain a healthy weight Body mass index (BMI) is used to identify weight problems. It estimates body fat based on height and weight. Your health care provider can help determine your BMI and help you achieve or maintain a healthy weight. Get regular exercise Get regular exercise. This is one of the most important things you can do for your health. Most adults should: Exercise for at least 150 minutes each week. The exercise should increase your heart rate and make you sweat (moderate-intensity exercise). Do strengthening exercises at least twice a week. This is in addition to the moderate-intensity exercise. Spend less time sitting. Even light physical activity can be beneficial. Watch cholesterol and blood lipids Have your blood tested for lipids and cholesterol at 68 years of age, then have this test every 5 years. Have your cholesterol levels checked more often if: Your lipid or cholesterol levels are high. You are older than 68 years of age. You are at high risk for heart disease. What should I know about cancer screening? Depending on your  health history and family history, you may need to have cancer screening at various ages. This may include screening for: Breast cancer. Cervical cancer. Colorectal cancer. Skin cancer. Lung cancer. What should I know about heart disease, diabetes, and high blood pressure? Blood pressure and heart disease High blood pressure causes heart disease and increases the risk of stroke. This is more likely to develop in people who have high blood pressure readings or are overweight. Have your blood pressure checked: Every 3-5 years if you are 66-31 years of age. Every year if you are 8 years old or older. Diabetes Have regular diabetes screenings. This checks your fasting blood sugar level. Have the screening done: Once every three years after age 21 if you are at a normal weight and have a low risk for diabetes. More often and at a younger age if you are overweight or have a high risk for diabetes. What should I know about preventing infection? Hepatitis B If you have a higher risk for hepatitis B, you should be screened for this virus. Talk with your health care provider to find out if you are at risk for hepatitis B infection. Hepatitis C Testing is recommended for: Everyone born from [birth year] through 1966. Anyone with known risk factors for hepatitis C. Sexually transmitted infections (STIs) Get screened for STIs, including gonorrhea and chlamydia, if: You are sexually active and are younger than 68 years of age. You are older than 68 years of age and your health care provider  tells you that you are at risk for this type of infection. Your sexual activity has changed since you were last screened, and you are at increased risk for chlamydia or gonorrhea. Ask your health care provider if you are at risk. Ask your health care provider about whether you are at high risk for HIV. Your health care provider may recommend a prescription medicine to help prevent HIV infection. If you choose to take  medicine to prevent HIV, you should first get tested for HIV. You should then be tested every 3 months for as long as you are taking the medicine. Pregnancy If you are about to stop having your period (premenopausal) and you may become pregnant, seek counseling before you get pregnant. Take 400 to 800 micrograms (mcg) of folic acid every day if you become pregnant. Ask for birth control (contraception) if you want to prevent pregnancy. Osteoporosis and menopause Osteoporosis is a disease in which the bones lose minerals and strength with aging. This can result in bone fractures. If you are 54 years old or older, or if you are at risk for osteoporosis and fractures, ask your health care provider if you should: Be screened for bone loss. Take a calcium or vitamin D supplement to lower your risk of fractures. Be given hormone replacement therapy (HRT) to treat symptoms of menopause. Follow these instructions at home: Alcohol use Do not drink alcohol if: Your health care provider tells you not to drink. You are pregnant, may be pregnant, or are planning to become pregnant. If you drink alcohol: Limit how much you have to: 0-1 drink a day. Know how much alcohol is in your drink. In the [place] one drink equals one 12 oz bottle of beer (355 mL), one 5 oz glass of wine (148 mL), or one 1 oz glass of hard liquor (44 mL). Lifestyle Do not use any products that contain nicotine or tobacco. These products include cigarettes, chewing tobacco, and vaping devices, such as e-cigarettes. If you need help quitting, ask your health care provider. Do not use street drugs. Do not share needles. Ask your health care provider for help if you need support or information about quitting drugs. General instructions Schedule regular health, dental, and eye exams. Stay current with your vaccines. Tell your health care provider if: You often feel depressed. You have ever been abused or do not feel safe at  home. Summary Adopting a healthy lifestyle and getting preventive care are important in promoting health and wellness. Follow your health care provider's instructions about healthy diet, exercising, and getting tested or screened for diseases. Follow your health care provider's instructions on monitoring your cholesterol and blood pressure. This information is not intended to replace advice given to you by your health care provider. Make sure you discuss any questions you have with your health care provider. Document Revised: 12/14/2020 Document Reviewed: 12/14/2020 Elsevier Patient Education  [place].

## 2022-11-08 NOTE — Progress Notes (Unsigned)
Subjective:    Patient ID: Gabriela Carrillo, female    DOB: 1956-07-29, 67 y.o.   MRN: [id number]      HPI Gabriela Carrillo is here for a Physical exam and her chronic medical problems.    Ears feel stopped up.   Hearing tested a couple of years ago -was told hearing aids would help. Sometimes pop or hear ringing.    Medications and allergies reviewed with patient and updated if appropriate.  Current Outpatient Medications on File Prior to Visit  Medication Sig Dispense Refill   amLODipine (NORVASC) 2.5 MG tablet TAKE 1 TABLET BY MOUTH EVERY DAY 90 tablet 2   ascorbic acid (VITAMIN C) 500 MG tablet Take 1 tablet by mouth daily.     Cholecalciferol (VITAMIN D3 PO) Take 2,000 capsules by mouth daily.     FLUoxetine (PROZAC) 20 MG capsule TAKE 1 CAPSULE BY MOUTH EVERY DAY 90 capsule 1   hydrochlorothiazide (HYDRODIURIL) 25 MG tablet TAKE 1 TABLET(25 MG) BY MOUTH DAILY 30 tablet 5   losartan (COZAAR) 100 MG tablet TAKE 1 TABLET(100 MG) BY MOUTH DAILY 90 tablet 1   Menaquinone-7 (VITAMIN K2 PO) Take by mouth daily.     Omega-3 1000 MG CAPS Take by mouth.     VITAMIN E PO Take by mouth.     No current facility-administered medications on file prior to visit.    Review of Systems  Constitutional:  Negative for fever.  HENT:  Negative for ear pain (Right ear feels clogged).   Eyes:  Negative for visual disturbance.  Respiratory:  Negative for cough, shortness of breath and wheezing.   Cardiovascular:  Negative for chest pain, palpitations and leg swelling.  Gastrointestinal:  Negative for abdominal pain, blood in stool, constipation and diarrhea.       Occ gerd  Genitourinary:  Negative for dysuria.  Musculoskeletal:  Positive for arthralgias (OA - hands, knees). Negative for back pain.  Skin:  Negative for rash.  Neurological:  Negative for light-headedness and headaches.  Psychiatric/Behavioral:  Negative for dysphoric mood. The patient is not nervous/anxious.        Objective:    Vitals:   11/09/22 1033  BP: 124/78  Pulse: 75  Temp: 99 F (37.2 C)  SpO2: 98%   Filed Weights   11/09/22 1033  Weight: 144 lb (65.3 kg)   Body mass index is 24.72 kg/m.  BP Readings from Last 3 Encounters:  11/09/22 124/78  05/21/22 (!) 143/85  10/29/21 124/80    Wt Readings from Last 3 Encounters:  11/09/22 144 lb (65.3 kg)  10/29/21 148 lb (67.1 kg)  11/02/20 146 lb (66.2 kg)       Physical Exam Constitutional: She appears well-developed and well-nourished. No distress.  HENT:  Head: Normocephalic and atraumatic.  Right Ear: External ear normal.  Your canal with excessive cerumen, TM not visualized Left Ear: External ear normal.  Normal ear canal and TM Mouth/Throat: Oropharynx is clear and moist.  Eyes: Conjunctivae normal.  Neck: Neck supple. No tracheal deviation present. No thyromegaly present.  No carotid bruit  Cardiovascular: Normal rate, regular rhythm and normal heart sounds.   No murmur heard.  No edema. Pulmonary/Chest: Effort normal and breath sounds normal. No respiratory distress. She has no wheezes. She has no rales.  Breast: deferred   Abdominal: Soft. She exhibits no distension. There is no tenderness.  Lymphadenopathy: She has no cervical adenopathy.  Skin: Skin is warm and dry. She is not diaphoretic.  Psychiatric: She has a normal mood and affect. Her behavior is normal.     Lab Results  Component Value Date   WBC 7.5 10/29/2021   HGB 15.0 10/29/2021   HCT 43.2 10/29/2021   PLT 259.0 10/29/2021   GLUCOSE 93 10/29/2021   CHOL 175 10/29/2021   TRIG 178.0 (H) 10/29/2021   HDL 50.00 10/29/2021   LDLCALC 89 10/29/2021   ALT 25 10/29/2021   AST 26 10/29/2021   NA 140 10/29/2021   K 3.0 (L) 10/29/2021   CL 100 10/29/2021   CREATININE 0.63 10/29/2021   BUN 12 10/29/2021   CO2 32 10/29/2021   TSH 0.83 10/29/2021   HGBA1C 4.8 05/03/2016         Assessment & Plan:   Physical exam: Screening blood work  ordered Exercise   walking, yardwork, treadmill Weight  normal Substance abuse  none   Reviewed recommended immunizations.   Health Maintenance  Topic Date Due   COVID-19 Vaccine (6 - 2023-24 season) 11/25/2022 (Originally 04/08/2022)   Zoster Vaccines- Shingrix (1 of 2) 02/08/2023 (Originally 02/10/1974)   DEXA SCAN  11/09/2023 (Originally 11/27/2009)   INFLUENZA VACCINE  03/09/2023   Medicare Annual Wellness (AWV)  07/12/2023   MAMMOGRAM  01/05/2024   COLONOSCOPY (Pts 45-80yrs Insurance coverage will need to be confirmed)  03/28/2024   DTaP/Tdap/Td (3 - Td or Tdap) 10/21/2027   Pneumonia Vaccine 39+ Years old  Completed   Hepatitis C Screening  Completed   HPV VACCINES  Aged Out          See Problem List for Assessment and Plan of chronic medical problems.

## 2022-11-09 ENCOUNTER — Ambulatory Visit (INDEPENDENT_AMBULATORY_CARE_PROVIDER_SITE_OTHER): Payer: PPO | Admitting: Internal Medicine

## 2022-11-09 ENCOUNTER — Encounter: Payer: Self-pay | Admitting: Internal Medicine

## 2022-11-09 VITALS — BP 124/78 | HR 75 | Temp 99.0°F | Ht 64.0 in | Wt 144.0 lb

## 2022-11-09 DIAGNOSIS — I10 Essential (primary) hypertension: Secondary | ICD-10-CM | POA: Diagnosis not present

## 2022-11-09 DIAGNOSIS — F411 Generalized anxiety disorder: Secondary | ICD-10-CM

## 2022-11-09 DIAGNOSIS — Z0000 Encounter for general adult medical examination without abnormal findings: Secondary | ICD-10-CM

## 2022-11-09 DIAGNOSIS — M810 Age-related osteoporosis without current pathological fracture: Secondary | ICD-10-CM | POA: Diagnosis not present

## 2022-11-09 DIAGNOSIS — H6121 Impacted cerumen, right ear: Secondary | ICD-10-CM | POA: Diagnosis not present

## 2022-11-09 LAB — LIPID PANEL
Cholesterol: 175 mg/dL (ref 0–200)
HDL: 51.4 mg/dL (ref >39.00)
LDL Cholesterol: 88 mg/dL (ref 0–99)
NonHDL: 123.26
Total CHOL/HDL Ratio: 3
Triglycerides: 175 mg/dL — ABNORMAL HIGH (ref 0.0–149.0)
VLDL: 35 mg/dL (ref 0.0–40.0)

## 2022-11-09 LAB — CBC WITH DIFFERENTIAL/PLATELET
Basophils Absolute: 0.1 10*3/uL (ref 0.0–0.1)
Basophils Relative: 0.9 % (ref 0.0–3.0)
Eosinophils Absolute: 0 10*3/uL (ref 0.0–0.7)
Eosinophils Relative: 0.7 % (ref 0.0–5.0)
HCT: 41.2 % (ref 36.0–46.0)
Hemoglobin: 14.7 g/dL (ref 12.0–15.0)
Lymphocytes Relative: 26.7 % (ref 12.0–46.0)
Lymphs Abs: 1.8 10*3/uL (ref 0.7–4.0)
MCHC: 35.6 g/dL (ref 30.0–36.0)
MCV: 89 fl (ref 78.0–100.0)
Monocytes Absolute: 0.6 10*3/uL (ref 0.1–1.0)
Monocytes Relative: 8.2 % (ref 3.0–12.0)
Neutro Abs: 4.4 10*3/uL (ref 1.4–7.7)
Neutrophils Relative %: 63.5 % (ref 43.0–77.0)
Platelets: 254 10*3/uL (ref 150.0–400.0)
RBC: 4.62 Mil/uL (ref 3.87–5.11)
RDW: 12.5 % (ref 11.5–15.5)
WBC: 6.9 10*3/uL (ref 4.0–10.5)

## 2022-11-09 LAB — COMPREHENSIVE METABOLIC PANEL
ALT: 25 U/L (ref 0–35)
AST: 28 U/L (ref 0–37)
Albumin: 4.5 g/dL (ref 3.5–5.2)
Alkaline Phosphatase: 71 U/L (ref 39–117)
BUN: 9 mg/dL (ref 6–23)
CO2: 28 mEq/L (ref 19–32)
Calcium: 9.2 mg/dL (ref 8.4–10.5)
Chloride: 101 mEq/L (ref 96–112)
Creatinine, Ser: 0.64 mg/dL (ref 0.40–1.20)
GFR: 91.24 mL/min (ref >60.00)
Glucose, Bld: 93 mg/dL (ref 70–99)
Potassium: 3.5 mEq/L (ref 3.5–5.1)
Sodium: 140 mEq/L (ref 135–145)
Total Bilirubin: 0.6 mg/dL (ref 0.2–1.2)
Total Protein: 6.9 g/dL (ref 6.0–8.3)

## 2022-11-09 LAB — VITAMIN D 25 HYDROXY (VIT D DEFICIENCY, FRACTURES): VITD: 31.05 ng/mL (ref 30.00–100.00)

## 2022-11-09 LAB — TSH: TSH: 0.96 u[IU]/mL (ref 0.35–5.50)

## 2022-11-09 MED ORDER — FLUOXETINE HCL 20 MG PO CAPS: ORAL_CAPSULE | ORAL | 3 refills | Status: DC

## 2022-11-09 MED ORDER — AMLODIPINE BESYLATE 2.5 MG PO TABS: 2.5000 mg | ORAL_TABLET | Freq: Every day | ORAL | 3 refills | Status: DC

## 2022-11-09 MED ORDER — LOSARTAN POTASSIUM 100 MG PO TABS: ORAL_TABLET | ORAL | 3 refills | Status: DC

## 2022-11-09 NOTE — Assessment & Plan Note (Signed)
Chronic ?Controlled, Stable ?Check TSH ?Continue fluoxetine 20 mg daily ?

## 2022-11-09 NOTE — Assessment & Plan Note (Addendum)
Chronic Managed by GYN DEXA up-to-date Continue regular exercise Continue vitamin D, vitamin K, calcium supplementation Check vitamin D level

## 2022-11-09 NOTE — Assessment & Plan Note (Addendum)
Chronic Blood pressure well controlled CMP, CBC, lipid Continue HCTZ 25 mg daily, losartan 100 mg daily, amlodipine 2.5 mg daily EKG today: NSR with mild sinus arrhythmia at 77 bpm, possible LVH ST-T wave abnormality-unchanged compared to previous EKG from 2016.

## 2022-11-09 NOTE — Assessment & Plan Note (Signed)
Acute Has had feeling of clogged right ear intermittently Has excessive cerumen Discussed ways of getting that cleaned out-will attempt to do this at home

## 2023-01-09 ENCOUNTER — Other Ambulatory Visit: Payer: Self-pay | Admitting: Internal Medicine

## 2023-01-09 DIAGNOSIS — Z0000 Encounter for general adult medical examination without abnormal findings: Secondary | ICD-10-CM

## 2023-01-25 ENCOUNTER — Ambulatory Visit
Admission: RE | Admit: 2023-01-25 | Discharge: 2023-01-25 | Disposition: A | Discharge status: Home / Self Care | Payer: PPO | Source: Ambulatory Visit | Attending: Internal Medicine | Admitting: Internal Medicine

## 2023-01-25 DIAGNOSIS — Z1231 Encounter for screening mammogram for malignant neoplasm of breast: Secondary | ICD-10-CM | POA: Diagnosis not present

## 2023-01-25 DIAGNOSIS — Z0000 Encounter for general adult medical examination without abnormal findings: Secondary | ICD-10-CM

## 2023-03-28 ENCOUNTER — Other Ambulatory Visit: Payer: Self-pay | Admitting: Internal Medicine

## 2023-03-30 ENCOUNTER — Encounter: Payer: Self-pay | Admitting: Internal Medicine

## 2023-03-30 MED ORDER — DIAZEPAM 5 MG PO TABS: ORAL_TABLET | ORAL | 0 refills | Status: AC

## 2023-04-30 ENCOUNTER — Other Ambulatory Visit: Payer: Self-pay | Admitting: Internal Medicine

## 2023-05-08 ENCOUNTER — Encounter: Payer: Self-pay | Admitting: Internal Medicine

## 2023-05-10 NOTE — Telephone Encounter (Signed)
Patient called to check on the status of her request. She said she tested positive on 05/08/23. Patient would like a call back at [phone] ASAP.

## 2023-05-11 MED ORDER — NIRMATRELVIR/RITONAVIR (PAXLOVID)TABLET: 3.0000 | ORAL_TABLET | Freq: Two times a day (BID) | ORAL | 0 refills | Status: AC

## 2023-05-11 NOTE — Telephone Encounter (Signed)
Patient would like to know what Dr. Lawerance Bach would like for her to do. She would like a call back at [phone]. She said she hasn't gotten any response since her original message on 05/08/23.

## 2023-09-19 DIAGNOSIS — Z01419 Encounter for gynecological examination (general) (routine) without abnormal findings: Secondary | ICD-10-CM | POA: Diagnosis not present

## 2023-09-19 DIAGNOSIS — Z6824 Body mass index (BMI) 24.0-24.9, adult: Secondary | ICD-10-CM | POA: Diagnosis not present

## 2023-10-09 DIAGNOSIS — H35372 Puckering of macula, left eye: Secondary | ICD-10-CM | POA: Diagnosis not present

## 2023-10-13 ENCOUNTER — Ambulatory Visit: Payer: PPO

## 2023-10-13 VITALS — Ht 64.0 in | Wt 144.0 lb

## 2023-10-13 DIAGNOSIS — Z0000 Encounter for general adult medical examination without abnormal findings: Secondary | ICD-10-CM

## 2023-10-13 NOTE — Progress Notes (Unsigned)
 Subjective:   Gabriela Carrillo is a 68 y.o. who presents for a Medicare Wellness preventive visit.  Visit Complete: Virtual I connected with  Gabriela Carrillo on 10/13/23 by a video and audio enabled telemedicine application and verified that I am speaking with the correct person using two identifiers.  Patient Location: Home  Provider Location: Home Office  I discussed the limitations of evaluation and management by telemedicine. The patient expressed understanding and agreed to proceed.  Vital Signs: Because this visit was a virtual/telehealth visit, some criteria may be missing or patient reported. Any vitals not documented were not able to be obtained and vitals that have been documented are patient reported.   AWV Questionnaire: No: Patient Medicare AWV questionnaire was not completed prior to this visit.  Cardiac Risk Factors include: advanced age (>57men, >79 women);hypertension;Other (see comment), Risk factor comments: Hx of breast cancer     Objective:    Today's Vitals   10/13/23 1528  Weight: 144 lb (65.3 kg)  Height: 5\' 4"  (1.626 m)   Body mass index is 24.72 kg/m.     10/13/2023    3:38 PM 07/11/2022    9:24 AM 03/14/2017    3:47 PM 12/10/2014    8:23 AM  Advanced Directives  Does Patient Have a Medical Advance Directive? No No No No  Would patient like information on creating a medical advance directive?  No - Patient declined  No - patient declined information    Current Medications (verified) Outpatient Encounter Medications as of 10/13/2023  Medication Sig   amLODipine (NORVASC) 2.5 MG tablet Take 1 tablet (2.5 mg total) by mouth daily.   ascorbic acid (VITAMIN C) 500 MG tablet Take 1 tablet by mouth daily.   Cholecalciferol (VITAMIN D3 PO) Take 2,000 capsules by mouth daily.   FLUoxetine (PROZAC) 20 MG capsule TAKE 1 CAPSULE BY MOUTH EVERY DAY   hydrochlorothiazide (HYDRODIURIL) 25 MG tablet TAKE 1 TABLET(25 MG) BY MOUTH DAILY   losartan (COZAAR) 100 MG tablet  TAKE 1 TABLET(100 MG) BY MOUTH DAILY   Menaquinone-7 (VITAMIN K2 PO) Take by mouth daily.   Omega-3 1000 MG CAPS Take by mouth.   VITAMIN E PO Take by mouth.   No facility-administered encounter medications on file as of 10/13/2023.    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 Minimize alcohol and caffeine. Eat small frequent meals with lean proteins and complex carbs. Avoid high and low blood sugars. Get adequate sleep, 7-8 hours a night. Needs exercise daily preferably in the morning. This is the first time she has had migrainous symptoms with N/V but denies photophobia, phonophobia or other neurologic c   Hypertension    Has been taking Losartan for about a year.   Hypokalemia    PMH OF   Personal history of chemotherapy    Personal history of radiation therapy    Past Surgical History:  Procedure Laterality Date   Abdominal Fat Transfer for Breast Augmentation Bilateral    AUGMENTATION MAMMAPLASTY Left    BREAST LUMPECTOMY Left 2004   CHEMOTHERAPY  FOLLOWED BY RADIATION   BREAST RECONSTRUCTION Left 12/18/2014   Procedure:  DELAYED LEFT BREAST RECONSTRUCTION WITH PLACEMENT OF GEL  IMPLANT;  Surgeon: Gabriela Sjogren, MD;  Location: [facility] OR;  Service: Plastics;  Laterality: Left;   BUNIONECTOMY BILATERALLY     CESAREAN SECTION     COLONOSCOPY  2011   negative   G3 P2     PLACEMENT OF BREAST IMPLANTS     left   Family History  Problem Relation Age of Onset   Diabetes Mother    Anxiety disorder Mother    Goiter Mother    Hypertension Sister        X2   Arthritis Sister        Rheumatoid    Cancer Neg Hx    Heart disease Neg Hx    Stroke Neg Hx    Colon cancer Neg Hx    Social  History   Socioeconomic History   Marital status: Married    Spouse name: Gabriela Carrillo   Number of children: Not on file   Years of education: Not on file   Highest education level: Not on file  Occupational History   Occupation: RETIRED  Tobacco Use   Smoking status: Never   Smokeless tobacco: Never  Vaping Use   Vaping status: Never Used  Substance and Sexual Activity   Alcohol use: Not Currently    Comment: rare   Drug use: No   Sexual activity: Not on file  Other Topics Concern   Not on file  Social History Narrative   WALKS DAILY 40-45 MINS      Lives at home with husband   Social Drivers of Corporate investment banker Strain: Low Risk  (10/13/2023)   Overall Financial Resource Strain (CARDIA)    Difficulty of Paying Living Expenses: Not hard at all  Food Insecurity: No Food Insecurity (10/13/2023)   Hunger Vital Sign    Worried About Running Out of Food in the Last Year: Never true    Ran Out of Food in the Last Year: Never true  Transportation Needs: No Transportation Needs (10/13/2023)   PRAPARE - Administrator, Civil Service (Medical): No    Lack of Transportation (Non-Medical): No  Physical Activity: Insufficiently Active (10/13/2023)   Exercise Vital Sign    Days of Exercise per Week: 7 days    Minutes of Exercise per Session: 20 min  Stress: No Stress Concern Present (10/13/2023)   [employer] of Occupational Health - Occupational Stress Questionnaire    Feeling of Stress : Not at all  Social Connections: Moderately Isolated (10/13/2023)   Social Connection and Isolation Panel [NHANES]    Frequency of Communication with Friends and Family: More than three times a week    Frequency of Social Gatherings with Friends and Family: Not on file    Attends Religious Services: Never    Database administrator or Organizations: No    Attends Engineer, structural: Never    Marital Status: Married    Tobacco Counseling Counseling given: Not  Answered    Clinical Intake:  Pre-visit preparation completed: Yes  Pain : No/denies pain     BMI - recorded: 24.72 Nutritional Status: BMI of 19-24  Normal Nutritional Risks: None Diabetes: No  How often do you need to have someone help you when you read instructions, pamphlets, or other written materials from your doctor or pharmacy?: 1 - Never  Interpreter Needed?: No  Information entered by :: Lourdes Kucharski, RMA   Activities of Daily Living  10/13/2023    3:29 PM  In your present state of health, do you have any difficulty performing the following activities:  Hearing? 1  Comment lt ear hearing loss  Vision? 0  Difficulty concentrating or making decisions? 0  Walking or climbing stairs? 0  Dressing or bathing? 0  Doing errands, shopping? 0  Preparing Food and eating ? N  Using the Toilet? N  In the past six months, have you accidently leaked urine? N  Do you have problems with loss of bowel control? N  Managing your Medications? N  Managing your Finances? N  Housekeeping or managing your Housekeeping? N    Patient Care Team: Pincus Sanes, MD as PCP - General (Internal Medicine) [place], Heather, OD (Optometry)  Indicate any recent Medical Services you may have received from other than [facility] providers in the past year (date may be approximate).     Assessment:   This is a routine wellness examination for Gabriela Carrillo.  Hearing/Vision screen Hearing Screening - Comments:: Lt ear hearing loss -per pt Vision Screening - Comments:: Wears eyeglasses and contact lenes   Goals Addressed             This Visit's Progress    Patient Stated   On track    Continue to eat healthy and stay active.       Depression Screen     10/13/2023    3:43 PM 11/09/2022   10:33 AM 07/11/2022    9:20 AM 10/29/2021   10:21 AM 11/02/2020   12:01 PM 05/09/2017    3:03 PM  PHQ 2/9 Scores  PHQ - 2 Score 0 0 1 0 0 0  PHQ- 9 Score 0    1     Fall Risk     10/13/2023    3:38  PM 11/09/2022   10:33 AM 07/11/2022    9:25 AM 10/29/2021   10:20 AM 11/02/2020   11:02 AM  Fall Risk   Falls in the past year? 0 0 0 0 0  Number falls in past yr: 0 0 0 0 0  Injury with Fall? 0 0 0 0 0  Risk for fall due to : No Fall Risks No Fall Risks No Fall Risks No Fall Risks   Follow up Falls prevention discussed;Falls evaluation completed Falls evaluation completed Falls evaluation completed Falls evaluation completed Falls evaluation completed    MEDICARE RISK AT HOME:  Medicare Risk at Home Any stairs in or around the home?: Yes If so, are there any without handrails?: Yes Home free of loose throw rugs in walkways, pet beds, electrical cords, etc?: Yes Adequate lighting in your home to reduce risk of falls?: Yes Life alert?: No Use of a cane, walker or w/c?: No Grab bars in the bathroom?: No Shower chair or bench in shower?: No Elevated toilet seat or a handicapped toilet?: No  TIMED UP AND GO:  Was the test performed?  No  Cognitive Function: 6CIT completed        10/13/2023    3:32 PM 07/11/2022    9:25 AM  6CIT Screen  What Year? 0 points 0 points  What month? 0 points 0 points  What time? 0 points 0 points  Count back from 20 0 points 0 points  Months in reverse 0 points 0 points  Repeat phrase 0 points 0 points  Total Score 0 points 0 points    Immunizations Immunization History  Administered Date(s) Administered   Influenza Inj  Mdck Quad Pf 04/30/2019   Influenza Split 05/18/2014   Influenza Whole 05/25/2010   Influenza,inj,Quad PF,6+ Mos 05/31/2015, 05/03/2016, 05/09/2017, 05/15/2018   Influenza,inj,quad, With Preservative 05/31/2015   Influenza-Unspecified 06/27/2013, 04/19/2019, 05/16/2020, 06/25/2021   PFIZER(Purple Top)SARS-COV-2 Vaccination 10/19/2019, 11/09/2019, 06/02/2020   PNEUMOCOCCAL CONJUGATE-20 11/03/2021   Td 08/09/2007   Tdap 10/20/2017   Unspecified SARS-COV-2 Vaccination 10/19/2019, 11/09/2019    Screening Tests Health  Maintenance  Topic Date Due   Zoster Vaccines- Shingrix (1 of 2) Never done   COVID-19 Vaccine (6 - 2024-25 season) 04/09/2023   DEXA SCAN  11/09/2023 (Originally 11/27/2009)   Colonoscopy  03/28/2024   Medicare Annual Wellness (AWV)  10/12/2024   MAMMOGRAM  01/24/2025   DTaP/Tdap/Td (3 - Td or Tdap) 10/21/2027   Pneumonia Vaccine 7+ Years old  Completed   INFLUENZA VACCINE  Completed   Hepatitis C Screening  Completed   HPV VACCINES  Aged Out    Health Maintenance  Health Maintenance Due  Topic Date Due   Zoster Vaccines- Shingrix (1 of 2) Never done   COVID-19 Vaccine (6 - 2024-25 season) 04/09/2023   Health Maintenance Items Addressed: See Nurse Notes  Additional Screening:  Vision Screening: Recommended annual ophthalmology exams for early detection of glaucoma and other disorders of the eye.  Dental Screening: Recommended annual dental exams for proper oral hygiene  Community Resource Referral / Chronic Care Management: CRR required this visit?  No   CCM required this visit?  No     Plan:     I have personally reviewed and noted the following in the patient's chart:   Medical and social history Use of alcohol, tobacco or illicit drugs  Current medications and supplements including opioid prescriptions. Patient is not currently taking opioid prescriptions. Functional ability and status Nutritional status Physical activity Advanced directives List of other physicians Hospitalizations, surgeries, and ER visits in previous 12 months Vitals Screenings to include cognitive, depression, and falls Referrals and appointments  In addition, I have reviewed and discussed with patient certain preventive protocols, quality metrics, and best practice recommendations. A written personalized care plan for preventive services as well as general preventive health recommendations were provided to patient.     Bentlee Drier L Calaya Gildner, CMA   10/13/2023   After Visit Summary:  (MyChart) Due to this being a telephonic visit, the after visit summary with patients personalized plan was offered to patient via MyChart   Notes: Please refer to Routing Comments.

## 2023-10-13 NOTE — Patient Instructions (Addendum)
 Gabriela Carrillo , Thank you for taking time to come for your Medicare Wellness Visit. I appreciate your ongoing commitment to your health goals. Please review the following plan we discussed and let me know if I can assist you in the future.   Referrals/Orders/Follow-Ups/Clinician Recommendations: It was nice talking to you today.  Remember to call [facility], at  [phone].  To schedule a appointment in August 2025.  Keep up the good work.    This is a list of the screening recommended for you and due dates:  Health Maintenance  Topic Date Due   Zoster (Shingles) Vaccine (1 of 2) Never done   COVID-19 Vaccine (6 - 2024-25 season) 04/09/2023   DEXA scan (bone density measurement)  11/09/2023*   Colon Cancer Screening  03/28/2024   Medicare Annual Wellness Visit  10/12/2024   Mammogram  01/24/2025   DTaP/Tdap/Td vaccine (3 - Td or Tdap) 10/21/2027   Pneumonia Vaccine  Completed   Flu Shot  Completed   Hepatitis C Screening  Completed   HPV Vaccine  Aged Out  *Topic was postponed. The date shown is not the original due date.    Advanced directives: (Declined) Advance directive discussed with you today. Even though you declined this today, please call our office should you change your mind, and we can give you the proper paperwork for you to fill out.  Next Medicare Annual Wellness Visit scheduled for next year: Yes

## 2023-10-30 ENCOUNTER — Other Ambulatory Visit: Payer: Self-pay | Admitting: Internal Medicine

## 2023-11-09 ENCOUNTER — Encounter: Payer: Self-pay | Admitting: Internal Medicine

## 2023-11-09 NOTE — Patient Instructions (Addendum)

## 2023-11-09 NOTE — Progress Notes (Unsigned)
 Subjective:    Patient ID: Gabriela Carrillo, female    DOB: [birth year], 68 y.o.   MRN: [id number]      HPI Gabriela Carrillo is here for a Physical exam and her chronic medical problems.   Dong well.     Medications and allergies reviewed with patient and updated if appropriate.  Current Outpatient Medications on File Prior to Visit  Medication Sig Dispense Refill   amLODipine (NORVASC) 2.5 MG tablet TAKE 1 TABLET(2.5 MG) BY MOUTH DAILY 90 tablet 3   ascorbic acid (VITAMIN C) 500 MG tablet Take 1 tablet by mouth daily.     Cholecalciferol (VITAMIN D3 PO) Take 2,000 capsules by mouth daily.     FLUoxetine (PROZAC) 20 MG capsule TAKE 1 CAPSULE BY MOUTH EVERY DAY 90 capsule 3   hydrochlorothiazide (HYDRODIURIL) 25 MG tablet TAKE 1 TABLET(25 MG) BY MOUTH DAILY 30 tablet 5   losartan (COZAAR) 100 MG tablet TAKE 1 TABLET(100 MG) BY MOUTH DAILY 90 tablet 3   Menaquinone-7 (VITAMIN K2 PO) Take by mouth daily.     Omega-3 1000 MG CAPS Take by mouth.     VITAMIN E PO Take by mouth.     No current facility-administered medications on file prior to visit.    Review of Systems  Constitutional:  Negative for fever.  Eyes:  Negative for visual disturbance.  Respiratory:  Negative for cough, shortness of breath and wheezing.   Cardiovascular:  Negative for chest pain, palpitations and leg swelling.  Gastrointestinal:  Negative for abdominal pain, blood in stool, constipation and diarrhea.       Occ gerd  Genitourinary:  Negative for dysuria.  Musculoskeletal:  Positive for arthralgias (mild). Negative for back pain.  Skin:  Negative for rash.  Neurological:  Positive for tremors (has noticed R > L hand tremor). Negative for dizziness, light-headedness and headaches.  Psychiatric/Behavioral:  Negative for dysphoric mood. The patient is nervous/anxious.        Objective:   Vitals:   11/10/23 1302  BP: 116/70  Pulse: 69  Temp: 98.5 F (36.9 C)  SpO2: 97%   Filed Weights   11/10/23 1302   Weight: 145 lb (65.8 kg)   Body mass index is 24.89 kg/m.  BP Readings from Last 3 Encounters:  11/10/23 116/70  11/09/22 124/78  05/21/22 (!) 143/85    Wt Readings from Last 3 Encounters:  11/10/23 145 lb (65.8 kg)  10/13/23 144 lb (65.3 kg)  11/09/22 144 lb (65.3 kg)       Physical Exam Constitutional: She appears well-developed and well-nourished. No distress.  HENT:  Head: Normocephalic and atraumatic.  Right Ear: External ear normal. Normal ear canal and TM Left Ear: External ear normal.  Normal ear canal and TM Mouth/Throat: Oropharynx is clear and moist.  Eyes: Conjunctivae normal.  Neck: Neck supple. No tracheal deviation present. No thyromegaly present.  No carotid bruit  Cardiovascular: Normal rate, regular rhythm and normal heart sounds.   No murmur heard.  No edema. Pulmonary/Chest: Effort normal and breath sounds normal. No respiratory distress. She has no wheezes. She has no rales.  Breast: deferred   Abdominal: Soft. She exhibits no distension. There is no tenderness.  Lymphadenopathy: She has no cervical adenopathy.  Neuro:  mild R > L hand tremor Skin: Skin is warm and dry. She is not diaphoretic.  Psychiatric: She has a normal mood and affect. Her behavior is normal.     Lab Results  Component Value Date  WBC 6.9 11/09/2022   HGB 14.7 11/09/2022   HCT 41.2 11/09/2022   PLT 254.0 11/09/2022   GLUCOSE 93 11/09/2022   CHOL 175 11/09/2022   TRIG 175.0 (H) 11/09/2022   HDL 51.40 11/09/2022   LDLCALC 88 11/09/2022   ALT 25 11/09/2022   AST 28 11/09/2022   NA 140 11/09/2022   K 3.5 11/09/2022   CL 101 11/09/2022   CREATININE 0.64 11/09/2022   BUN 9 11/09/2022   CO2 28 11/09/2022   TSH 0.96 11/09/2022   HGBA1C 4.8 05/03/2016         Assessment & Plan:   Physical exam: Screening blood work  ordered Exercise  active - yard work, mows yard, steps, walking some - a few a week Weight  normal Substance abuse  none   Reviewed  recommended immunizations.   Health Maintenance  Topic Date Due   COVID-19 Vaccine (6 - 2024-25 season) 11/25/2023 (Originally 04/09/2023)   Zoster Vaccines- Shingrix (1 of 2) 02/09/2024 (Originally 02/10/1974)   DEXA SCAN  11/09/2024 (Originally 11/27/2009)   INFLUENZA VACCINE  03/08/2024   Colonoscopy  03/28/2024   Medicare Annual Wellness (AWV)  10/12/2024   MAMMOGRAM  01/24/2025   DTaP/Tdap/Td (3 - Td or Tdap) 10/21/2027   Pneumonia Vaccine 58+ Years old  Completed   Hepatitis C Screening  Completed   HPV VACCINES  Aged Out          See Problem List for Assessment and Plan of chronic medical problems.

## 2023-11-10 ENCOUNTER — Ambulatory Visit: Payer: PPO | Admitting: Internal Medicine

## 2023-11-10 ENCOUNTER — Encounter: Payer: Self-pay | Admitting: Internal Medicine

## 2023-11-10 VITALS — BP 116/70 | HR 69 | Temp 98.5°F | Ht 64.0 in | Wt 145.0 lb

## 2023-11-10 DIAGNOSIS — I10 Essential (primary) hypertension: Secondary | ICD-10-CM

## 2023-11-10 DIAGNOSIS — Z0000 Encounter for general adult medical examination without abnormal findings: Secondary | ICD-10-CM | POA: Diagnosis not present

## 2023-11-10 DIAGNOSIS — F411 Generalized anxiety disorder: Secondary | ICD-10-CM

## 2023-11-10 DIAGNOSIS — M810 Age-related osteoporosis without current pathological fracture: Secondary | ICD-10-CM

## 2023-11-10 DIAGNOSIS — R251 Tremor, unspecified: Secondary | ICD-10-CM | POA: Diagnosis not present

## 2023-11-10 LAB — CBC WITH DIFFERENTIAL/PLATELET
Basophils Absolute: 0 10*3/uL (ref 0.0–0.1)
Basophils Relative: 0.4 % (ref 0.0–3.0)
Eosinophils Absolute: 0 10*3/uL (ref 0.0–0.7)
Eosinophils Relative: 0.7 % (ref 0.0–5.0)
HCT: 43.6 % (ref 36.0–46.0)
Hemoglobin: 15.2 g/dL — ABNORMAL HIGH (ref 12.0–15.0)
Lymphocytes Relative: 30.8 % (ref 12.0–46.0)
Lymphs Abs: 2.3 10*3/uL (ref 0.7–4.0)
MCHC: 34.9 g/dL (ref 30.0–36.0)
MCV: 90.4 fl (ref 78.0–100.0)
Monocytes Absolute: 0.6 10*3/uL (ref 0.1–1.0)
Monocytes Relative: 7.5 % (ref 3.0–12.0)
Neutro Abs: 4.5 10*3/uL (ref 1.4–7.7)
Neutrophils Relative %: 60.6 % (ref 43.0–77.0)
Platelets: 240 10*3/uL (ref 150.0–400.0)
RBC: 4.83 Mil/uL (ref 3.87–5.11)
RDW: 12.6 % (ref 11.5–15.5)
WBC: 7.4 10*3/uL (ref 4.0–10.5)

## 2023-11-10 LAB — LIPID PANEL
Cholesterol: 188 mg/dL (ref 0–200)
HDL: 54.7 mg/dL (ref >39.00)
LDL Cholesterol: 102 mg/dL — ABNORMAL HIGH (ref 0–99)
NonHDL: 132.95
Total CHOL/HDL Ratio: 3
Triglycerides: 155 mg/dL — ABNORMAL HIGH (ref 0.0–149.0)
VLDL: 31 mg/dL (ref 0.0–40.0)

## 2023-11-10 LAB — COMPREHENSIVE METABOLIC PANEL WITH GFR
ALT: 23 U/L (ref 0–35)
AST: 25 U/L (ref 0–37)
Albumin: 4.6 g/dL (ref 3.5–5.2)
Alkaline Phosphatase: 58 U/L (ref 39–117)
BUN: 13 mg/dL (ref 6–23)
CO2: 29 meq/L (ref 19–32)
Calcium: 9.4 mg/dL (ref 8.4–10.5)
Chloride: 99 meq/L (ref 96–112)
Creatinine, Ser: 0.74 mg/dL (ref 0.40–1.20)
GFR: 82.94 mL/min (ref >60.00)
Glucose, Bld: 95 mg/dL (ref 70–99)
Potassium: 3.5 meq/L (ref 3.5–5.1)
Sodium: 140 meq/L (ref 135–145)
Total Bilirubin: 0.5 mg/dL (ref 0.2–1.2)
Total Protein: 7 g/dL (ref 6.0–8.3)

## 2023-11-10 LAB — VITAMIN D 25 HYDROXY (VIT D DEFICIENCY, FRACTURES): VITD: 64.4 ng/mL (ref 30.00–100.00)

## 2023-11-10 LAB — TSH: TSH: 1.02 u[IU]/mL (ref 0.35–5.50)

## 2023-11-10 NOTE — Assessment & Plan Note (Signed)
 Chronic Blood pressure well controlled CMP, CBC, lipid Continue HCTZ 25 mg daily, losartan 100 mg daily, amlodipine 2.5 mg daily

## 2023-11-10 NOTE — Assessment & Plan Note (Signed)
 New Has noticed tremor in R hand > L hand Possible family history  Mild now -- so will just monitor

## 2023-11-10 NOTE — Assessment & Plan Note (Signed)
Chronic ?Controlled, Stable ?Check TSH ?Continue fluoxetine 20 mg daily ?

## 2023-11-10 NOTE — Assessment & Plan Note (Addendum)
 Chronic Managed by GYN DEXA due this year Continue regular exercise/ high activity Continue vitamin D, vitamin K, calcium supplementation Check vitamin D level

## 2023-11-12 ENCOUNTER — Encounter: Payer: Self-pay | Admitting: Internal Medicine

## 2023-11-30 ENCOUNTER — Other Ambulatory Visit: Payer: Self-pay | Admitting: Internal Medicine

## 2023-12-12 DIAGNOSIS — Z13820 Encounter for screening for osteoporosis: Secondary | ICD-10-CM | POA: Diagnosis not present

## 2023-12-12 DIAGNOSIS — N958 Other specified menopausal and perimenopausal disorders: Secondary | ICD-10-CM | POA: Diagnosis not present

## 2024-01-23 ENCOUNTER — Other Ambulatory Visit: Payer: Self-pay | Admitting: Internal Medicine

## 2024-02-20 ENCOUNTER — Encounter: Payer: Self-pay | Admitting: Internal Medicine

## 2024-03-06 ENCOUNTER — Other Ambulatory Visit: Payer: Self-pay | Admitting: Obstetrics and Gynecology

## 2024-03-06 DIAGNOSIS — Z1231 Encounter for screening mammogram for malignant neoplasm of breast: Secondary | ICD-10-CM

## 2024-03-19 ENCOUNTER — Ambulatory Visit
Admission: RE | Admit: 2024-03-19 | Discharge: 2024-03-19 | Disposition: A | Discharge status: Home / Self Care | Source: Ambulatory Visit | Attending: Obstetrics and Gynecology | Admitting: Obstetrics and Gynecology

## 2024-03-19 ENCOUNTER — Encounter: Payer: Self-pay | Admitting: Gastroenterology

## 2024-03-19 DIAGNOSIS — Z1231 Encounter for screening mammogram for malignant neoplasm of breast: Secondary | ICD-10-CM | POA: Diagnosis not present

## 2024-04-09 NOTE — Progress Notes (Signed)
 "   Subjective:    Patient ID: Gabriela Carrillo, female    DOB: [birth year], 68 y.o.   MRN: [id number]      HPI Gabriela Carrillo is here for  Chief Complaint  Patient presents with   Sinus Problem    Sinus pressure in her head, nasal drainage, sores in her nose x a few weeks (3 weeks)    Discussed the use of AI scribe software for clinical note transcription with the patient, who gave verbal consent to proceed.  History of Present Illness Gabriela Carrillo is a 68 year old female  who presents with worsening sinus symptoms and nasal sores.  Her symptoms began with severe postnasal drip in July. Since then, she has experienced worsening sinus pressure, headaches, and nasal congestion. She describes a sensation of being in a tunnel and clogged ears.  She has been using ibuprofen, taking two tablets before bed, which she finds helpful for her headaches. She has also applied Vaseline and hydrogen peroxide to the nasal sores, but these have not provided relief. The sores have been present for at least two weeks and are not currently bleeding.  No fever, cough, shortness of breath, wheezing, or significant dizziness. She reports a slight sore throat that started a couple of days ago, described as itchy rather than painful. No ear pain, nausea, or stomach symptoms.         Medications and allergies reviewed with patient and updated if appropriate.  Current Outpatient Medications on File Prior to Visit  Medication Sig Dispense Refill   amLODipine  (NORVASC ) 2.5 MG tablet TAKE 1 TABLET(2.5 MG) BY MOUTH DAILY 90 tablet 3   ascorbic acid (VITAMIN C) 500 MG tablet Take 1 tablet by mouth daily.     Cholecalciferol (VITAMIN D3 PO) Take 2,000 capsules by mouth daily.     FLUoxetine  (PROZAC ) 20 MG capsule TAKE 1 CAPSULE BY MOUTH EVERY DAY 90 capsule 3   losartan  (COZAAR ) 100 MG tablet TAKE 1 TABLET(100 MG) BY MOUTH DAILY 90 tablet 3   Menaquinone-7 (VITAMIN K2 PO) Take by mouth daily.     Omega-3 1000 MG  CAPS Take by mouth.     VITAMIN E PO Take by mouth.     No current facility-administered medications on file prior to visit.    Review of Systems  Constitutional:  Negative for fever.  HENT:  Positive for congestion, postnasal drip, sinus pressure and sore throat (minimal). Negative for ear pain (ears feel clogged).        Nasal sores.  Teeth ache  Respiratory:  Negative for cough, shortness of breath and wheezing.   Neurological:  Positive for headaches. Negative for dizziness and light-headedness.       Objective:   Vitals:   04/10/24 1056  Pulse: 72  Temp: 98.6 F (37 C)  SpO2: 98%   BP Readings from Last 3 Encounters:  11/10/23 116/70  11/09/22 124/78  05/21/22 (!) 143/85   Wt Readings from Last 3 Encounters:  04/10/24 144 lb (65.3 kg)  11/10/23 145 lb (65.8 kg)  10/13/23 144 lb (65.3 kg)   Body mass index is 24.72 kg/m.    Physical Exam Constitutional:      General: She is not in acute distress.    Appearance: Normal appearance. She is not ill-appearing.  HENT:     Head: Normocephalic and atraumatic.     Right Ear: Tympanic membrane, ear canal and external ear normal.     Left Ear: Tympanic membrane,  ear canal and external ear normal.     Mouth/Throat:     Mouth: Mucous membranes are moist.     Pharynx: No oropharyngeal exudate or posterior oropharyngeal erythema.  Eyes:     Conjunctiva/sclera: Conjunctivae normal.  Cardiovascular:     Rate and Rhythm: Normal rate and regular rhythm.  Pulmonary:     Effort: Pulmonary effort is normal. No respiratory distress.     Breath sounds: Normal breath sounds. No wheezing or rales.  Musculoskeletal:     Cervical back: Neck supple. No tenderness.  Lymphadenopathy:     Cervical: No cervical adenopathy.  Skin:    General: Skin is warm and dry.  Neurological:     Mental Status: She is alert.            Assessment & Plan:    See Problem List for Assessment and Plan of chronic medical problems.       "

## 2024-04-10 ENCOUNTER — Other Ambulatory Visit: Payer: Self-pay | Admitting: Internal Medicine

## 2024-04-10 ENCOUNTER — Ambulatory Visit (INDEPENDENT_AMBULATORY_CARE_PROVIDER_SITE_OTHER): Admitting: Internal Medicine

## 2024-04-10 ENCOUNTER — Encounter: Payer: Self-pay | Admitting: Internal Medicine

## 2024-04-10 VITALS — BP 122/78 | HR 72 | Temp 98.6°F | Ht 64.0 in | Wt 144.0 lb

## 2024-04-10 DIAGNOSIS — J019 Acute sinusitis, unspecified: Secondary | ICD-10-CM | POA: Diagnosis not present

## 2024-04-10 DIAGNOSIS — J3489 Other specified disorders of nose and nasal sinuses: Secondary | ICD-10-CM

## 2024-04-10 MED ORDER — HYDROCODONE BIT-HOMATROP MBR 5-1.5 MG/5ML PO SOLN: 5.0000 mL | Freq: Four times a day (QID) | ORAL | 0 refills | Status: AC | PRN

## 2024-04-10 MED ORDER — AMOXICILLIN-POT CLAVULANATE 875-125 MG PO TABS: 1.0000 | ORAL_TABLET | Freq: Two times a day (BID) | ORAL | 0 refills | Status: AC

## 2024-04-10 MED ORDER — MUPIROCIN 2 % EX OINT: 1.0000 | TOPICAL_OINTMENT | Freq: Two times a day (BID) | CUTANEOUS | 0 refills | Status: AC

## 2024-04-10 NOTE — Assessment & Plan Note (Signed)
 Acute Likely related to current sinus infection Has tried Vaseline and hydroperoxide without much improvement Start Bactroban  ointment twice daily for several days

## 2024-04-10 NOTE — Patient Instructions (Addendum)
       Medications changes include :   Augmentin , nasal ointment, cough syrup       Return if symptoms worsen or fail to improve.

## 2024-04-10 NOTE — Assessment & Plan Note (Signed)
 Acute Likely bacterial  Start Augmentin  875-125 mg BID x 10 day Hycodan cough syrup otc cold medications Rest, fluid Call if no improvement

## 2024-04-26 ENCOUNTER — Encounter: Payer: Self-pay | Admitting: Gastroenterology

## 2024-04-26 ENCOUNTER — Ambulatory Visit (AMBULATORY_SURGERY_CENTER): Admitting: *Deleted

## 2024-04-26 VITALS — Ht 64.0 in | Wt 140.0 lb

## 2024-04-26 DIAGNOSIS — Z860100 Personal history of colon polyps, unspecified: Secondary | ICD-10-CM

## 2024-04-26 MED ORDER — NA SULFATE-K SULFATE-MG SULF 17.5-3.13-1.6 GM/177ML PO SOLN: 1.0000 | Freq: Once | ORAL | 0 refills | Status: AC

## 2024-04-26 NOTE — Progress Notes (Signed)
 Pt's name and DOB verified at the beginning of the pre-visit with 2 identifiers  Pt denies any difficulty with ambulating,sitting, laying down or rolling side to side  Pt has no issues moving head neck or swallowing  No egg or soy allergy known to patient   No issues known to pt with past sedation  No FH of Malignant Hyperthermia  Pt is not on home 02   Pt is not on blood thinners   Pt denies issues with constipation   Pt is not on dialysis  Pt denise any abnormal heart rhythms   Pt denies any upcoming cardiac testing  Patient's chart reviewed by Norleen Schillings CNRA prior to pre-visit and patient appropriate for the [facility].  Pre-visit completed and red dot placed by patient's name on their procedure day (on provider's schedule).    Visit by phone  Pt states weight is 140 lb  Pt given  both [facility] main # and MD on call # prior to instructions.  Informed pt to come in at the time discussed and is shown on PV instructions.  Pt instructed to use Single[employer].com or GoodRx for a price reduction on prep  Instructed pt where to find PV instructions in My Chart  Instructed pt on all aspects of written instructions including med holds clothing to wear and foods to eat and not eat as well as after procedure legal restrictions and to call MD on call if needed.. Pt states understanding. Instructed pt to review instructions again prior to procedure and call main # given if has any questions or any issues. Pt states they will.

## 2024-05-10 ENCOUNTER — Encounter: Payer: Self-pay | Admitting: Gastroenterology

## 2024-05-10 ENCOUNTER — Ambulatory Visit: Admitting: Gastroenterology

## 2024-05-10 VITALS — BP 93/49 | HR 67 | Temp 98.4°F | Resp 15 | Ht 64.0 in | Wt 140.0 lb

## 2024-05-10 DIAGNOSIS — K5730 Diverticulosis of large intestine without perforation or abscess without bleeding: Secondary | ICD-10-CM | POA: Diagnosis not present

## 2024-05-10 DIAGNOSIS — Z860101 Personal history of adenomatous and serrated colon polyps: Secondary | ICD-10-CM | POA: Diagnosis not present

## 2024-05-10 DIAGNOSIS — Z1211 Encounter for screening for malignant neoplasm of colon: Secondary | ICD-10-CM | POA: Diagnosis not present

## 2024-05-10 DIAGNOSIS — I10 Essential (primary) hypertension: Secondary | ICD-10-CM | POA: Diagnosis not present

## 2024-05-10 DIAGNOSIS — Z860100 Personal history of colon polyps, unspecified: Secondary | ICD-10-CM

## 2024-05-10 DIAGNOSIS — K6389 Other specified diseases of intestine: Secondary | ICD-10-CM | POA: Diagnosis not present

## 2024-05-10 DIAGNOSIS — F419 Anxiety disorder, unspecified: Secondary | ICD-10-CM | POA: Diagnosis not present

## 2024-05-10 MED ORDER — SODIUM CHLORIDE 0.9 % IV SOLN: 500.0000 mL | Freq: Once | INTRAVENOUS | Status: DC

## 2024-05-10 NOTE — Progress Notes (Signed)
 [facility] History and Physical   Primary Care Physician:  Geofm Glade PARAS, MD   Reason for Procedure:  History of adenomatous colon polyps  Plan:    Surveillance colonoscopy with possible interventions as needed     HPI: Gabriela Carrillo is a very pleasant 68 y.o. female here for surveillance colonoscopy. Denies any nausea, vomiting, abdominal pain, melena or bright red blood per rectum  The risks and benefits as well as alternatives of endoscopic procedure(s) have been discussed and reviewed. All questions answered. The patient agrees to proceed.    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 Minimize alcohol and caffeine. Eat small frequent meals with lean proteins and complex carbs. Avoid high and low blood sugars. Get adequate sleep, 7-8 hours a night. Needs exercise daily preferably in the morning. This is the first time she has had migrainous symptoms with N/V but denies photophobia, phonophobia or other neurologic c   Hypertension    Has been taking Losartan  for about a year.   Hypokalemia    PMH OF   Personal history of chemotherapy    Personal history of radiation therapy     Past Surgical History:  Procedure Laterality Date   Abdominal Fat Transfer for Breast Augmentation Bilateral    AUGMENTATION MAMMAPLASTY Left    BREAST LUMPECTOMY Left 2004   CHEMOTHERAPY  FOLLOWED BY RADIATION   BREAST RECONSTRUCTION Left 12/18/2014   Procedure: DELAYED LEFT BREAST RECONSTRUCTION WITH PLACEMENT OF GEL  IMPLANT;  Surgeon: Alm Sick, MD;  Location: [facility] OR;  Service: Plastics;  Laterality: Left;   BUNIONECTOMY BILATERALLY     CESAREAN SECTION     COLONOSCOPY  2011   negative   G3 P2     PLACEMENT OF BREAST IMPLANTS      left    Prior to Admission medications   Medication Sig Start Date End Date Taking? Authorizing Provider  amLODipine  (NORVASC ) 2.5 MG tablet TAKE 1 TABLET(2.5 MG) BY MOUTH DAILY 10/30/23  Yes Burns, Glade PARAS, MD  Calcium Carbonate Antacid (CALCIUM CARBONATE, DOSED IN MG ELEMENTAL CALCIUM,) 1250 MG/5ML SUSP Take 500 mg of elemental calcium by mouth daily at 12 noon.   Yes [provider]  Cholecalciferol (VITAMIN D3 PO) Take 2,000 capsules by mouth daily.   Yes [provider]  FLUoxetine  (PROZAC ) 20 MG capsule TAKE 1 CAPSULE BY MOUTH EVERY DAY 10/30/23  Yes Burns, Glade PARAS, MD  hydrochlorothiazide  (HYDRODIURIL ) 25 MG tablet TAKE 1 TABLET(25 MG) BY MOUTH DAILY 04/10/24  Yes Burns, Glade PARAS, MD  ibuprofen (ADVIL) 800 MG tablet Take 800 mg by mouth every 8 (eight) hours. 03/13/24  Yes [provider]  losartan  (COZAAR ) 100 MG tablet TAKE 1 TABLET(100 MG) BY MOUTH DAILY 01/23/24  Yes Burns, Glade PARAS, MD  Menaquinone-7 (VITAMIN K2 PO) Take by mouth daily.   Yes [provider]  Omega-3 1000 MG CAPS Take by mouth.   Yes [provider]  VITAMIN E PO Take by mouth.   Yes [provider]  ascorbic acid (VITAMIN C) 500 MG tablet Take 1 tablet by mouth daily. Patient not taking: Reported on 04/26/2024    [provider]  HYDROcodone  bit-homatropine (HYCODAN) 5-1.5 MG/5ML syrup Take 5 mLs by mouth every 6 (six) hours as needed for cough. Patient not taking: No sig reported 04/10/24   Geofm Glade PARAS, MD  HYDROcodone -acetaminophen  (NORCO/VICODIN) 5-325 MG tablet Take 1 tablet by mouth every 6 (six) hours as needed. Patient not taking: Reported on 05/10/2024 03/13/24   [provider]  mupirocin  ointment (BACTROBAN ) 2 % Apply 1 Application topically 2 (two) times daily. Patient not taking: Reported on 05/10/2024 04/10/24   Geofm Glade PARAS, MD    Current Outpatient Medications  Medication Sig Dispense Refill   amLODipine  (NORVASC ) 2.5 MG tablet TAKE 1  TABLET(2.5 MG) BY MOUTH DAILY 90 tablet 3   Calcium Carbonate Antacid (CALCIUM CARBONATE, DOSED IN MG ELEMENTAL CALCIUM,) 1250 MG/5ML SUSP Take 500 mg of elemental calcium by mouth daily at 12 noon.     Cholecalciferol (VITAMIN D3 PO) Take 2,000 capsules by mouth daily.     FLUoxetine  (PROZAC ) 20 MG capsule TAKE 1 CAPSULE BY MOUTH EVERY DAY 90 capsule 3   hydrochlorothiazide  (HYDRODIURIL ) 25 MG tablet TAKE 1 TABLET(25 MG) BY MOUTH DAILY 30 tablet 5   ibuprofen (ADVIL) 800 MG tablet Take 800 mg by mouth every 8 (eight) hours.     losartan  (COZAAR ) 100 MG tablet TAKE 1 TABLET(100 MG) BY MOUTH DAILY 90 tablet 3   Menaquinone-7 (VITAMIN K2 PO) Take by mouth daily.     Omega-3 1000 MG CAPS Take by mouth.     VITAMIN E PO Take by mouth.     ascorbic acid (VITAMIN C) 500 MG tablet Take 1 tablet by mouth daily. (Patient not taking: Reported on 04/26/2024)     HYDROcodone  bit-homatropine (HYCODAN) 5-1.5 MG/5ML syrup Take 5 mLs by mouth every 6 (six) hours as needed for cough. (Patient not taking: No sig reported) 120 mL 0   HYDROcodone -acetaminophen  (NORCO/VICODIN) 5-325 MG tablet Take 1 tablet by mouth every 6 (six) hours as needed. (Patient not taking: Reported on 05/10/2024)     mupirocin  ointment (BACTROBAN ) 2 % Apply 1 Application topically 2 (two) times daily. (Patient not taking: Reported on 05/10/2024) 22 g 0   Current Facility-Administered Medications  Medication Dose Route Frequency Provider Last Rate Last Admin   0.9 %  sodium chloride  infusion  500 mL Intravenous Once Tonea Leiphart V, MD        Allergies as of 05/10/2024 - Review Complete 05/10/2024  Allergen Reaction Noted   Sulfonamide derivatives  08/07/2008    Family History  Problem Relation Age of Onset   Diabetes Mother    Anxiety disorder Mother    Goiter Mother    Pancreatitis Sister        COD   Hypertension Sister        X2   Arthritis Sister        Rheumatoid    Cancer Neg Hx    Heart disease Neg Hx    Stroke Neg  Hx    Colon cancer Neg Hx    Colon polyps Neg Hx    Rectal cancer Neg Hx    Stomach cancer Neg Hx    Esophageal cancer Neg Hx     Social History   Socioeconomic History   Marital status: Married    Spouse name: Will   Number of children: Not on file   Years of education: Not on file   Highest education level: Not on file  Occupational History   Occupation: RETIRED  Tobacco Use   Smoking  status: Never   Smokeless tobacco: Never  Vaping Use   Vaping status: Never Used  Substance and Sexual Activity   Alcohol use: Yes    Comment: rare   Drug use: No   Sexual activity: Not on file  Other Topics Concern   Not on file  Social History Narrative   WALKS DAILY 40-45 MINS      Lives at home with husband   Social Drivers of Health   Financial Resource Strain: Patient Declined (04/08/2024)   Overall Financial Resource Strain (CARDIA)    Difficulty of Paying Living Expenses: Patient declined  Food Insecurity: Patient Declined (04/08/2024)   Hunger Vital Sign    Worried About Running Out of Food in the Last Year: Patient declined    Ran Out of Food in the Last Year: Patient declined  Transportation Needs: Patient Declined (04/08/2024)   PRAPARE - Administrator, Civil Service (Medical): Patient declined    Lack of Transportation (Non-Medical): Patient declined  Physical Activity: Unknown (04/08/2024)   Exercise Vital Sign    Days of Exercise per Week: 3 days    Minutes of Exercise per Session: Not on file  Stress: No Stress Concern Present (10/13/2023)   [employer] of Occupational Health - Occupational Stress Questionnaire    Feeling of Stress : Not at all  Social Connections: Unknown (04/08/2024)   Social Connection and Isolation Panel    Frequency of Communication with Friends and Family: Patient declined    Frequency of Social Gatherings with Friends and Family: Patient declined    Attends Religious Services: Not on Insurance claims handler of Clubs or  Organizations: Patient declined    Attends Banker Meetings: Not on file    Marital Status: Patient declined  Intimate Partner Violence: Not At Risk (10/13/2023)   Humiliation, Afraid, Rape, and Kick questionnaire    Fear of Current or Ex-Partner: No    Emotionally Abused: No    Physically Abused: No    Sexually Abused: No    Review of Systems:  All other review of systems negative except as mentioned in the HPI.  Physical Exam: Vital signs in last 24 hours: BP 114/80   Pulse 90   Temp 98.4 F (36.9 C) (Skin)   Ht 5' 4 (1.626 m)   Wt 140 lb (63.5 kg)   SpO2 98%   BMI 24.03 kg/m  General:   Alert, NAD Lungs:  Clear .   Heart:  Regular rate and rhythm Abdomen:  Soft, nontender and nondistended. Neuro/Psych:  Alert and cooperative. Normal mood and affect. A and O x 3  Reviewed labs, radiology imaging, old records and pertinent past GI work up  Patient is appropriate for planned procedure(s) and anesthesia in an ambulatory setting   K. Veena Achol Azpeitia , MD [phone]

## 2024-05-10 NOTE — Progress Notes (Signed)
Pt. states no medical or surgical changes since previsit or office visit. 

## 2024-05-10 NOTE — Op Note (Signed)
 [facility] Patient Name: Gabriela Carrillo Procedure Date: 05/10/2024 9:52 AM MRN: [id number] Endoscopist: Gustav ALONSO Mcgee , MD, [id number] Age: 68 Referring MD:  Date of Birth: Jan 13, 1956 Gender: Female Account #: [id number] Procedure:                Colonoscopy Indications:              High risk colon cancer surveillance: Personal                            history of colonic polyps, High risk colon cancer                            surveillance: Personal history of adenoma less than                            10 mm in size Medicines:                Monitored Anesthesia Care Procedure:                Pre-Anesthesia Assessment:                           - Prior to the procedure, a History and Physical                            was performed, and patient medications and                            allergies were reviewed. The patient's tolerance of                            previous anesthesia was also reviewed. The risks                            and benefits of the procedure and the sedation                            options and risks were discussed with the patient.                            All questions were answered, and informed consent                            was obtained. Prior Anticoagulants: The patient has                            taken no anticoagulant or antiplatelet agents. ASA                            Grade Assessment: II - A patient with mild systemic                            disease. After reviewing the risks and benefits,  the patient was deemed in satisfactory condition to                            undergo the procedure.                           After obtaining informed consent, the colonoscope                            was passed under direct vision. Throughout the                            procedure, the patient's blood pressure, pulse, and                            oxygen saturations were monitored  continuously. The                            Olympus Scope SN [id number] was introduced through the                            anus and advanced to the the cecum, identified by                            appendiceal orifice and ileocecal valve. The                            colonoscopy was performed without difficulty. The                            patient tolerated the procedure well. The quality                            of the bowel preparation was good. The ileocecal                            valve, appendiceal orifice, and rectum were                            photographed. Scope In: 10:10:19 AM Scope Out: 10:26:25 AM Scope Withdrawal Time: 0 hours 8 minutes 46 seconds  Total Procedure Duration: 0 hours 16 minutes 6 seconds  Findings:                 The perianal and digital rectal examinations were                            normal.                           A few medium-mouthed and small-mouthed diverticula                            were found in the sigmoid colon.  A patchy area of mildly friable mucosa was found in                            the proximal ascending colon and in the cecum.                           The exam was otherwise without abnormality. Complications:            No immediate complications. Estimated Blood Loss:     Estimated blood loss was minimal. Impression:               - Diverticulosis in the sigmoid colon.                           - Friability in the proximal ascending colon and in                            the cecum.                           - The examination was otherwise normal.                           - No specimens collected. Recommendation:           - Resume previous diet.                           - Continue present medications.                           - No repeat colonoscopy due to current age (28                            years or older) and the absence of colonic polyps. Ramia Sidney V. Clayborn Milnes, MD 05/10/2024  10:36:07 AM This report has been signed electronically.

## 2024-05-10 NOTE — Progress Notes (Signed)
 Transferred to PACU via stretcher, arousing, VSS.

## 2024-05-10 NOTE — Patient Instructions (Signed)
 Resume all of your previous medications today as ordered. Try to eat a high fiber diet, and drink plenty of fluids. Read your discharge instructions.   YOU HAD AN ENDOSCOPIC PROCEDURE TODAY AT THE [facility]:   Refer to the procedure report that was given to you for any specific questions about what was found during the examination.  If the procedure report does not answer your questions, please call your gastroenterologist to clarify.  If you requested that your care partner not be given the details of your procedure findings, then the procedure report has been included in a sealed envelope for you to review at your convenience later.  YOU SHOULD EXPECT: Some feelings of bloating in the abdomen. Passage of more gas than usual.  Walking can help get rid of the air that was put into your GI tract during the procedure and reduce the bloating. If you had a lower endoscopy (such as a colonoscopy or flexible sigmoidoscopy) you may notice spotting of blood in your stool or on the toilet paper. If you underwent a bowel prep for your procedure, you may not have a normal bowel movement for a few days.  Please Note:  You might notice some irritation and congestion in your nose or some drainage.  This is from the oxygen used during your procedure.  There is no need for concern and it should clear up in a day or so.  SYMPTOMS TO REPORT IMMEDIATELY:  Following lower endoscopy (colonoscopy or flexible sigmoidoscopy):  Excessive amounts of blood in the stool  Significant tenderness or worsening of abdominal pains  Swelling of the abdomen that is new, acute  Fever of 100F or higher   For urgent or emergent issues, a gastroenterologist can be reached at any hour by calling [phone]. Do not use MyChart messaging for urgent concerns.    DIET:  We do recommend a small meal at first, but then you may proceed to your regular diet.  Drink plenty of fluids but you should avoid alcoholic  beverages for 24 hours.  ACTIVITY:  You should plan to take it easy for the rest of today and you should NOT DRIVE or use heavy machinery until tomorrow (because of the sedation medicines used during the test).    FOLLOW UP: Our staff will call the number listed on your records the next business day following your procedure.  We will call around 7:15- 8:00 am to check on you and address any questions or concerns that you may have regarding the information given to you following your procedure. If we do not reach you, we will leave a message.      SIGNATURES/CONFIDENTIALITY: You and/or your care partner have signed paperwork which will be entered into your electronic medical record.  These signatures attest to the fact that that the information above on your After Visit Summary has been reviewed and is understood.  Full responsibility of the confidentiality of this discharge information lies with you and/or your care-partner.

## 2024-05-13 ENCOUNTER — Telehealth: Payer: Self-pay

## 2024-05-13 NOTE — Telephone Encounter (Signed)
Attempted to reach patient for post-procedure f/u call. No answer. Left message for her to please not hesitate to call if she has questions/concerns regarding her care.

## 2024-08-01 ENCOUNTER — Other Ambulatory Visit: Payer: Self-pay | Admitting: Internal Medicine

## 2024-08-16 ENCOUNTER — Other Ambulatory Visit: Payer: Self-pay | Admitting: Internal Medicine

## 2024-10-15 ENCOUNTER — Ambulatory Visit
